# Patient Record
Sex: Female | Born: 1960
Health system: Southern US, Community
[De-identification: ages and names within clinical notes are randomized; demographics above are authoritative.]

## PROBLEM LIST (undated history)

## (undated) DIAGNOSIS — K635 Polyp of colon: Secondary | ICD-10-CM

## (undated) DIAGNOSIS — G8929 Other chronic pain: Secondary | ICD-10-CM

## (undated) DIAGNOSIS — K589 Irritable bowel syndrome without diarrhea: Secondary | ICD-10-CM

## (undated) DIAGNOSIS — K8020 Calculus of gallbladder without cholecystitis without obstruction: Secondary | ICD-10-CM

## (undated) DIAGNOSIS — F329 Major depressive disorder, single episode, unspecified: Secondary | ICD-10-CM

## (undated) DIAGNOSIS — T7840XA Allergy, unspecified, initial encounter: Secondary | ICD-10-CM

## (undated) DIAGNOSIS — R519 Headache, unspecified: Secondary | ICD-10-CM

## (undated) DIAGNOSIS — B1910 Unspecified viral hepatitis B without hepatic coma: Secondary | ICD-10-CM

## (undated) DIAGNOSIS — H8109 Meniere's disease, unspecified ear: Secondary | ICD-10-CM

## (undated) DIAGNOSIS — K5790 Diverticulosis of intestine, part unspecified, without perforation or abscess without bleeding: Secondary | ICD-10-CM

## (undated) DIAGNOSIS — G4730 Sleep apnea, unspecified: Secondary | ICD-10-CM

## (undated) DIAGNOSIS — T782XXA Anaphylactic shock, unspecified, initial encounter: Secondary | ICD-10-CM

## (undated) DIAGNOSIS — E119 Type 2 diabetes mellitus without complications: Secondary | ICD-10-CM

## (undated) DIAGNOSIS — F32A Depression, unspecified: Secondary | ICD-10-CM

## (undated) DIAGNOSIS — G709 Myoneural disorder, unspecified: Secondary | ICD-10-CM

## (undated) DIAGNOSIS — M797 Fibromyalgia: Secondary | ICD-10-CM

## (undated) DIAGNOSIS — R51 Headache: Secondary | ICD-10-CM

## (undated) HISTORY — DX: Anaphylactic shock, unspecified, initial encounter: T78.2XXA

## (undated) HISTORY — DX: Calculus of gallbladder without cholecystitis without obstruction: K80.20

## (undated) HISTORY — DX: Fibromyalgia: M79.7

## (undated) HISTORY — DX: Irritable bowel syndrome, unspecified: K58.9

## (undated) HISTORY — DX: Meniere's disease, unspecified ear: H81.09

## (undated) HISTORY — DX: Depression, unspecified: F32.A

## (undated) HISTORY — DX: Headache: R51

## (undated) HISTORY — PX: CHOLECYSTECTOMY: SHX55

## (undated) HISTORY — DX: Major depressive disorder, single episode, unspecified: F32.9

## (undated) HISTORY — DX: Myoneural disorder, unspecified: G70.9

## (undated) HISTORY — DX: Type 2 diabetes mellitus without complications: E11.9

## (undated) HISTORY — DX: Polyp of colon: K63.5

## (undated) HISTORY — PX: TRIGGER FINGER RELEASE: SHX641

## (undated) HISTORY — DX: Unspecified viral hepatitis B without hepatic coma: B19.10

## (undated) HISTORY — DX: Diverticulosis of intestine, part unspecified, without perforation or abscess without bleeding: K57.90

## (undated) HISTORY — DX: Headache, unspecified: R51.9

## (undated) HISTORY — DX: Other chronic pain: G89.29

## (undated) HISTORY — DX: Sleep apnea, unspecified: G47.30

## (undated) HISTORY — DX: Allergy, unspecified, initial encounter: T78.40XA

---

## 1981-01-25 HISTORY — PX: PILONIDAL CYST EXCISION: SHX744

## 1998-01-25 HISTORY — PX: ABDOMINAL HYSTERECTOMY: SHX81

## 1999-05-21 ENCOUNTER — Other Ambulatory Visit: Admission: RE | Admit: 1999-05-21 | Discharge: 1999-05-21 | Payer: Self-pay | Admitting: Obstetrics & Gynecology

## 1999-11-04 ENCOUNTER — Inpatient Hospital Stay (HOSPITAL_COMMUNITY): Admission: AD | Admit: 1999-11-04 | Discharge: 1999-11-06 | Payer: Self-pay | Admitting: *Deleted

## 1999-11-07 ENCOUNTER — Inpatient Hospital Stay (HOSPITAL_COMMUNITY): Admission: AD | Admit: 1999-11-07 | Discharge: 1999-11-07 | Payer: Self-pay | Admitting: Obstetrics and Gynecology

## 2000-06-29 ENCOUNTER — Encounter: Admission: RE | Admit: 2000-06-29 | Discharge: 2000-08-15 | Payer: Self-pay | Admitting: Internal Medicine

## 2001-11-28 ENCOUNTER — Encounter: Payer: Self-pay | Admitting: Otolaryngology

## 2001-11-28 ENCOUNTER — Ambulatory Visit (HOSPITAL_COMMUNITY): Admission: RE | Admit: 2001-11-28 | Discharge: 2001-11-28 | Payer: Self-pay | Admitting: Otolaryngology

## 2002-01-25 DIAGNOSIS — M797 Fibromyalgia: Secondary | ICD-10-CM

## 2002-01-25 HISTORY — PX: KNEE ARTHROSCOPY: SUR90

## 2002-01-25 HISTORY — DX: Fibromyalgia: M79.7

## 2005-01-25 DIAGNOSIS — T782XXA Anaphylactic shock, unspecified, initial encounter: Secondary | ICD-10-CM

## 2005-01-25 HISTORY — DX: Anaphylactic shock, unspecified, initial encounter: T78.2XXA

## 2009-10-28 LAB — HM COLONOSCOPY

## 2010-06-10 ENCOUNTER — Emergency Department: Payer: Self-pay | Admitting: Emergency Medicine

## 2010-06-26 DIAGNOSIS — H8103 Meniere's disease, bilateral: Secondary | ICD-10-CM | POA: Insufficient documentation

## 2010-10-16 ENCOUNTER — Ambulatory Visit (INDEPENDENT_AMBULATORY_CARE_PROVIDER_SITE_OTHER): Payer: BC Managed Care – PPO | Admitting: Internal Medicine

## 2010-10-16 ENCOUNTER — Encounter: Payer: Self-pay | Admitting: Internal Medicine

## 2010-10-16 ENCOUNTER — Telehealth: Payer: Self-pay | Admitting: Internal Medicine

## 2010-10-16 DIAGNOSIS — Z0000 Encounter for general adult medical examination without abnormal findings: Secondary | ICD-10-CM | POA: Insufficient documentation

## 2010-10-16 DIAGNOSIS — Z91018 Allergy to other foods: Secondary | ICD-10-CM | POA: Insufficient documentation

## 2010-10-16 DIAGNOSIS — R350 Frequency of micturition: Secondary | ICD-10-CM

## 2010-10-16 DIAGNOSIS — Z1239 Encounter for other screening for malignant neoplasm of breast: Secondary | ICD-10-CM

## 2010-10-16 DIAGNOSIS — R609 Edema, unspecified: Secondary | ICD-10-CM

## 2010-10-16 DIAGNOSIS — M797 Fibromyalgia: Secondary | ICD-10-CM | POA: Insufficient documentation

## 2010-10-16 DIAGNOSIS — Z86010 Personal history of colonic polyps: Secondary | ICD-10-CM

## 2010-10-16 LAB — POCT URINALYSIS DIPSTICK
Bilirubin, UA: NEGATIVE
Glucose, UA: NEGATIVE
Nitrite, UA: NEGATIVE

## 2010-10-16 MED ORDER — FUROSEMIDE 20 MG PO TABS: 20.0000 mg | ORAL_TABLET | Freq: Every day | ORAL | Status: DC

## 2010-10-16 NOTE — Patient Instructions (Signed)
I recommend stopping your natural remedies and starting each one back at two week intervals so we can determine if one of them is causing your fluid retention.   We are switching your diuretic to furosemide 20 mg daily .  Please get your annual physical scheduled at your convenience.

## 2010-10-16 NOTE — Progress Notes (Signed)
Subjective:    Patient ID: Barbara Wade, female    DOB: Jan 27, 1960, 50 y.o.   MRN: [id number]  HPI  New patient presents with LE edema after a 14 day vacation in [place]. Flew,  Came back 3 days PTA.  Swelling started on sept 5th . Started new supplements 4 weeks ago, including  DMEA CoQ10, Vit C, chromium piccolinate, inositol, L tyrosine,  And lecithin all self started.  Has  Been going to a gym in August doing circuit training as well as 2 miles 3 times weekly.  More short of breath lately with walk, but no orthopnea or chest pain .    Has been eating more whole grains,  Avoiding refined starches.  Does not use salt because of Meniere's..  Has OSA,  Uses CPAP 15 cm H20,  No test in years,  But sleeping well and using it faithfully and feeling well rested.   2nd cc is constipation for 4 years. Typical history is no bm for 5 days,  Then has a day of cramping and multiple bowel movements.  No change since exercising or starting new meds.  Last thyroid screen this summer normal.  3rd complaint is bladder incontinence with hesitancy  Couple of years, progressive.  Saw a urologist at [facility] 4 yrs at [facility] with testing. History of 2 vaginal deliveries,  No pelvic exam in over 4 yrs.  Hysterectomy,   Episodes of incontinence occur with sudden changes in position. No urge symptoms.    Past Medical History  Diagnosis Date  . Allergy   . Hypertension   . Sleep apnea   . Anaphylaxis 2007    occurred during allergy testing to environmental allergen  . Neuromuscular disorder   . Fibromyalgia 2004  . Depression     managed currently with [facility] Wort   No current outpatient prescriptions on file prior to visit.     Review of Systems  Constitutional: Negative for fever, chills and unexpected weight change.  HENT: Negative for hearing loss, ear pain, nosebleeds, congestion, sore throat, facial swelling, rhinorrhea, sneezing, mouth sores, trouble swallowing, neck pain, neck stiffness, voice change,  postnasal drip, sinus pressure, tinnitus and ear discharge.   Eyes: Negative for pain, discharge, redness and visual disturbance.  Respiratory: Negative for cough, chest tightness, shortness of breath, wheezing and stridor.   Cardiovascular: Positive for leg swelling. Negative for chest pain and palpitations.  Gastrointestinal: Positive for constipation.  Genitourinary: Positive for urgency and genital sores.  Musculoskeletal: Negative for myalgias and arthralgias.  Skin: Negative for color change and rash.  Neurological: Negative for dizziness, weakness, light-headedness and headaches.  Hematological: Negative for adenopathy.       Objective:   Physical Exam  Constitutional: She is oriented to person, place, and time. She appears well-developed and well-nourished.  HENT:  Mouth/Throat: Oropharynx is clear and moist.  Eyes: EOM are normal. Pupils are equal, round, and reactive to light. No scleral icterus.  Neck: Normal range of motion. Neck supple. No JVD present. No thyromegaly present.  Cardiovascular: Normal rate, regular rhythm, normal heart sounds and intact distal pulses.   Pulmonary/Chest: Effort normal and breath sounds normal.  Abdominal: Soft. Bowel sounds are normal. She exhibits no mass. There is no tenderness.  Musculoskeletal: Normal range of motion. She exhibits no edema.  Lymphadenopathy:    She has no cervical adenopathy.  Neurological: She is alert and oriented to person, place, and time.  Skin: Skin is warm and dry.  Psychiatric: She has  a normal mood and affect.          Assessment & Plan:

## 2010-10-17 LAB — COMPREHENSIVE METABOLIC PANEL
ALT: 75 U/L — ABNORMAL HIGH (ref 0–35)
AST: 51 U/L — ABNORMAL HIGH (ref 0–37)
Alkaline Phosphatase: 108 U/L (ref 39–117)
Calcium: 9.8 mg/dL (ref 8.4–10.5)
Chloride: 104 mEq/L (ref 96–112)
Creat: 0.69 mg/dL (ref 0.50–1.10)
Potassium: 3.9 mEq/L (ref 3.5–5.3)

## 2010-10-17 LAB — MICROALBUMIN / CREATININE URINE RATIO: Microalb Creat Ratio: 5.4 mg/g (ref 0.0–30.0)

## 2010-10-17 NOTE — Progress Notes (Signed)
Quick Note:  SHE HAS NO PROTEIN IN HER URINE BUT HER LIVER ENZYMES ARE A LITTLE ELEVATED. She needs to stop all of her supplements and we will repeat a hepatic panel in two weeks. v58.69 ______

## 2010-10-18 ENCOUNTER — Encounter: Payer: Self-pay | Admitting: Internal Medicine

## 2010-10-18 DIAGNOSIS — R609 Edema, unspecified: Secondary | ICD-10-CM | POA: Insufficient documentation

## 2010-10-18 NOTE — Assessment & Plan Note (Signed)
Etiology unclear.  She has treated OSA, so unlikely the cause,  Is exercising regularly and denies orthopena so not likley chf.  Conside r neprhotic syndrome vs VI vs drug effect, given all of the nutraceuticals she recently started.  Will rx lasxis,  Check Urine for protnein, have her stop all of the natural remedies she started  Taking and resume them one at a time at  Two week intervals.

## 2010-10-19 MED ORDER — LACTULOSE 20 GM/30ML PO SOLN: 15.0000 mL | Freq: Four times a day (QID) | ORAL | Status: DC | PRN

## 2010-10-19 NOTE — Telephone Encounter (Signed)
rx for lactulose sent to [facility].  Please tell patient that this is not for daily use, only for when bulk forming laxatives (metamucil. Miralax) fail.

## 2010-10-19 NOTE — Telephone Encounter (Signed)
Patient is asking that something for constipation be [facility] to [facility] on [address]

## 2010-10-20 NOTE — Telephone Encounter (Signed)
Left message advising patient

## 2010-11-06 ENCOUNTER — Other Ambulatory Visit (INDEPENDENT_AMBULATORY_CARE_PROVIDER_SITE_OTHER): Payer: BC Managed Care – PPO | Admitting: *Deleted

## 2010-11-06 DIAGNOSIS — Z79899 Other long term (current) drug therapy: Secondary | ICD-10-CM

## 2010-11-06 LAB — BASIC METABOLIC PANEL
BUN: 11 mg/dL (ref 6–23)
CO2: 30 mEq/L (ref 19–32)
Chloride: 105 mEq/L (ref 96–112)
Creatinine, Ser: 0.7 mg/dL (ref 0.4–1.2)
Potassium: 4.3 mEq/L (ref 3.5–5.1)

## 2010-11-10 ENCOUNTER — Telehealth: Payer: Self-pay | Admitting: Internal Medicine

## 2010-11-10 NOTE — Telephone Encounter (Signed)
Pt called to get lab results °

## 2010-11-11 NOTE — Telephone Encounter (Signed)
Patient notified of labs.   

## 2010-11-13 ENCOUNTER — Encounter: Payer: Self-pay | Admitting: Internal Medicine

## 2010-11-13 ENCOUNTER — Telehealth: Payer: Self-pay | Admitting: Internal Medicine

## 2010-11-13 DIAGNOSIS — N926 Irregular menstruation, unspecified: Secondary | ICD-10-CM

## 2010-11-13 NOTE — Telephone Encounter (Signed)
Yes

## 2010-11-13 NOTE — Telephone Encounter (Signed)
Pt has lab appointment 11/18/10 she wanted to know if you would add a full thyroid panel and a menapausal test to those labs

## 2010-11-16 NOTE — Telephone Encounter (Signed)
Pt aware we can add labs

## 2010-11-18 ENCOUNTER — Other Ambulatory Visit (INDEPENDENT_AMBULATORY_CARE_PROVIDER_SITE_OTHER): Payer: BC Managed Care – PPO | Admitting: *Deleted

## 2010-11-18 DIAGNOSIS — N926 Irregular menstruation, unspecified: Secondary | ICD-10-CM

## 2010-11-18 LAB — FOLLICLE STIMULATING HORMONE: FSH: 52.3 m[IU]/mL

## 2010-11-24 ENCOUNTER — Other Ambulatory Visit (INDEPENDENT_AMBULATORY_CARE_PROVIDER_SITE_OTHER): Payer: BC Managed Care – PPO | Admitting: *Deleted

## 2010-11-24 DIAGNOSIS — E785 Hyperlipidemia, unspecified: Secondary | ICD-10-CM

## 2010-11-24 DIAGNOSIS — Z131 Encounter for screening for diabetes mellitus: Secondary | ICD-10-CM

## 2010-11-24 LAB — HEMOGLOBIN A1C: Hgb A1c MFr Bld: 6.6 % — ABNORMAL HIGH (ref 4.6–6.5)

## 2010-11-24 LAB — LIPID PANEL
HDL: 45.5 mg/dL (ref >39.00)
Total CHOL/HDL Ratio: 5
Triglycerides: 238 mg/dL — ABNORMAL HIGH (ref 0.0–149.0)

## 2010-11-24 LAB — COMPREHENSIVE METABOLIC PANEL
AST: 37 U/L (ref 0–37)
Alkaline Phosphatase: 107 U/L (ref 39–117)
BUN: 15 mg/dL (ref 6–23)
Creatinine, Ser: 0.6 mg/dL (ref 0.4–1.2)
Potassium: 4.2 mEq/L (ref 3.5–5.1)

## 2010-11-24 LAB — LDL CHOLESTEROL, DIRECT: Direct LDL: 151.4 mg/dL

## 2010-11-26 ENCOUNTER — Telehealth: Payer: Self-pay | Admitting: Internal Medicine

## 2010-11-26 NOTE — Telephone Encounter (Signed)
Patient called for her results she was put on the schedule for November 5 to discuss labs.

## 2010-11-30 ENCOUNTER — Encounter: Payer: Self-pay | Admitting: Internal Medicine

## 2010-11-30 ENCOUNTER — Ambulatory Visit (INDEPENDENT_AMBULATORY_CARE_PROVIDER_SITE_OTHER): Payer: BC Managed Care – PPO | Admitting: Internal Medicine

## 2010-11-30 DIAGNOSIS — H8109 Meniere's disease, unspecified ear: Secondary | ICD-10-CM

## 2010-11-30 DIAGNOSIS — E119 Type 2 diabetes mellitus without complications: Secondary | ICD-10-CM

## 2010-11-30 DIAGNOSIS — B1910 Unspecified viral hepatitis B without hepatic coma: Secondary | ICD-10-CM

## 2010-11-30 DIAGNOSIS — E785 Hyperlipidemia, unspecified: Secondary | ICD-10-CM

## 2010-11-30 DIAGNOSIS — R7989 Other specified abnormal findings of blood chemistry: Secondary | ICD-10-CM

## 2010-11-30 DIAGNOSIS — R609 Edema, unspecified: Secondary | ICD-10-CM

## 2010-11-30 DIAGNOSIS — Z23 Encounter for immunization: Secondary | ICD-10-CM

## 2010-11-30 MED ORDER — TRIAMTERENE-HCTZ 37.5-25 MG PO TABS: 1.0000 | ORAL_TABLET | Freq: Every day | ORAL | Status: DC

## 2010-11-30 NOTE — Patient Instructions (Addendum)
Read about the low glycemic index diet,  The Mediterranedan diet, and Medifast  Online.  Your goal in exercise is eventually 5 days per wekk,  20 minutes of vigorous aerobic exercise.   Try the pita bread and flatbread by [employer]:  It is low carb. Limit rice, pasta and potaties to once each per week.  Avid watermelon, bananas and pineapple  Return  in 3 months , fasting labs before that

## 2010-11-30 NOTE — Progress Notes (Signed)
Subjective:    Patient ID: Barbara Wade, female    DOB: 10-20-1960, 50 y.o.   MRN: [id number]  HPI  Barbara Wade is a 50 yo white female who returns for followup on edema and on  abnormal labs which were found after her initial visit .  The workup for her LE edema thus far was negative.  There was no improvement with change in diuretic to lasix, and her Meniere's Disease has worsened since she stopped the maxzide. ON her initial labs she was noted to have elevated AST and ALT and I asked her to stop all all of her nutraceuticals.  She recalls that she did have a history of Hepatitis B in 1998. She has no histoyr of blood transfusion and does not known who she contracted it or whether she has chronic hepatitis C.  Thirdly since she stopped her [facility] Wort she has noticed a worsening of her mood disorder.   4th ,  Her labs indicated a new diagnosis of diabetes with a random glucose of 209 and a hgba1c of 6.6. Past Medical History  Diagnosis Date  . Allergy   . Hypertension   . Sleep apnea   . Anaphylaxis 2007    occurred during allergy testing to environmental allergen  . Neuromuscular disorder   . Fibromyalgia 2004  . Depression     managed currently with [facility]  . Hepatitis B virus infection    Current Outpatient Prescriptions on File Prior to Visit  Medication Sig Dispense Refill  . hyoscyamine (ANASPAZ) 0.125 MG TBDP Place 0.125 mg under the tongue as needed.        . Lactulose 20 GM/30ML SOLN Take 15 mLs (10 g total) by mouth every 6 (six) hours as needed (for relief of constipation).  240 mL  0  . meclizine (ANTIVERT) 25 MG tablet Take 25 mg by mouth 2 (two) times daily as needed.          Review of Systems  Constitutional: Positive for unexpected weight change. Negative for fever and chills.  HENT: Negative for hearing loss, ear pain, nosebleeds, congestion, sore throat, facial swelling, rhinorrhea, sneezing, mouth sores, trouble swallowing, neck pain, neck stiffness,  voice change, postnasal drip, sinus pressure, tinnitus and ear discharge.   Eyes: Negative for pain, discharge, redness and visual disturbance.  Respiratory: Negative for cough, chest tightness, shortness of breath, wheezing and stridor.   Cardiovascular: Positive for leg swelling. Negative for chest pain and palpitations.  Musculoskeletal: Negative for myalgias and arthralgias.  Skin: Negative for color change and rash.  Neurological: Negative for dizziness, weakness, light-headedness and headaches.  Hematological: Negative for adenopathy.  Psychiatric/Behavioral: Positive for sleep disturbance and dysphoric mood.       Objective:   Physical Exam  Constitutional: She is oriented to person, place, and time. She appears well-developed and well-nourished.  HENT:  Mouth/Throat: Oropharynx is clear and moist.  Eyes: EOM are normal. Pupils are equal, round, and reactive to light. No scleral icterus.  Neck: Normal range of motion. Neck supple. No JVD present. No thyromegaly present.  Cardiovascular: Normal rate, regular rhythm, normal heart sounds and intact distal pulses.   Pulmonary/Chest: Effort normal and breath sounds normal.  Abdominal: Soft. Bowel sounds are normal. She exhibits no mass. There is no tenderness.  Musculoskeletal: Normal range of motion. She exhibits edema.  Lymphadenopathy:    She has no cervical adenopathy.  Neurological: She is alert and oriented to person, place, and time.  Skin: Skin  is warm and dry.  Psychiatric: She has a normal mood and affect.          Assessment & Plan:

## 2010-12-02 ENCOUNTER — Encounter: Payer: Self-pay | Admitting: Internal Medicine

## 2010-12-02 DIAGNOSIS — B1910 Unspecified viral hepatitis B without hepatic coma: Secondary | ICD-10-CM | POA: Insufficient documentation

## 2010-12-02 DIAGNOSIS — E785 Hyperlipidemia, unspecified: Secondary | ICD-10-CM | POA: Insufficient documentation

## 2010-12-02 DIAGNOSIS — E118 Type 2 diabetes mellitus with unspecified complications: Secondary | ICD-10-CM | POA: Insufficient documentation

## 2010-12-02 NOTE — Assessment & Plan Note (Signed)
Referral to [facility] for venousl ultrasounds.

## 2010-12-02 NOTE — Assessment & Plan Note (Signed)
New diagnosis, with random glucose of 209 and hgba1c of 6.6  Spent 20 minutes reviewing fundamentals of diabetes management including diet and exercise.

## 2010-12-02 NOTE — Assessment & Plan Note (Signed)
She may have chronic hepatitis B given her persistent ALT elevation.  Will request records from prior physician to review workup.

## 2010-12-02 NOTE — Assessment & Plan Note (Signed)
With both elevated LDL and triglycerides in the setting of obesity and new onset diabetes,  Will repeat in 3 months after diet and exercise.

## 2010-12-08 ENCOUNTER — Encounter: Payer: BC Managed Care – PPO | Admitting: Internal Medicine

## 2010-12-28 ENCOUNTER — Encounter: Payer: Self-pay | Admitting: Internal Medicine

## 2010-12-28 ENCOUNTER — Ambulatory Visit (INDEPENDENT_AMBULATORY_CARE_PROVIDER_SITE_OTHER): Payer: BC Managed Care – PPO | Admitting: Internal Medicine

## 2010-12-28 VITALS — BP 112/68 | HR 73 | Temp 98.2°F | Resp 16 | Ht 63.5 in | Wt 220.0 lb

## 2010-12-28 DIAGNOSIS — Z01419 Encounter for gynecological examination (general) (routine) without abnormal findings: Secondary | ICD-10-CM

## 2010-12-28 DIAGNOSIS — K589 Irritable bowel syndrome without diarrhea: Secondary | ICD-10-CM

## 2010-12-28 DIAGNOSIS — Z86010 Personal history of colonic polyps: Secondary | ICD-10-CM

## 2010-12-28 MED ORDER — DICYCLOMINE HCL 20 MG PO TABS: 20.0000 mg | ORAL_TABLET | Freq: Three times a day (TID) | ORAL | Status: DC

## 2010-12-28 MED ORDER — SULFAMETHOXAZOLE-TRIMETHOPRIM 800-160 MG PO TABS: 1.0000 | ORAL_TABLET | Freq: Two times a day (BID) | ORAL | Status: AC

## 2010-12-28 NOTE — Patient Instructions (Addendum)
Irritable Bowel Syndrome Irritable Bowel Syndrome (IBS) is caused by a disturbance of normal bowel function. Other terms used are spastic colon, mucous colitis, and irritable colon. It does not require surgery, nor does it lead to cancer. There is no cure for IBS. But with proper diet, stress reduction, and medication, you will find that your problems (symptoms) will gradually disappear or improve. IBS is a common digestive disorder. It usually appears in late adolescence or early adulthood. Women develop it twice as often as men. CAUSES  After food has been digested and absorbed in the small intestine, waste material is moved into the colon (large intestine). In the colon, water and salts are absorbed from the undigested products coming from the small intestine. The remaining residue, or fecal material, is held for elimination. Under normal circumstances, gentle, rhythmic contractions on the bowel walls push the fecal material along the colon towards the rectum. In IBS, however, these contractions are irregular and poorly coordinated. The fecal material is either retained too long, resulting in constipation, or expelled too soon, producing diarrhea. SYMPTOMS  The most common symptom of IBS is pain. It is typically in the lower left side of the belly (abdomen). But it may occur anywhere in the abdomen. It can be felt as heartburn, backache, or even as a dull pain in the arms or shoulders. The pain comes from excessive bowel-muscle spasms and from the buildup of gas and fecal material in the colon. This pain:  Can range from sharp belly (abdominal) cramps to a dull, continuous ache.   Usually worsens soon after eating.   Is typically relieved by having a bowel movement or passing gas.  Abdominal pain is usually accompanied by constipation. But it may also produce diarrhea. The diarrhea typically occurs right after a meal or upon arising in the morning. The stools are typically soft and watery. They are  often flecked with secretions (mucus). Other symptoms of IBS include:  Bloating.   Loss of appetite.   Heartburn.   Feeling sick to your stomach (nausea).   Belching   Vomiting   Gas.  IBS may also cause a number of symptoms that are unrelated to the digestive system:  Fatigue.   Headaches.   Anxiety   Shortness of breath   Difficulty in concentrating.   Dizziness.  These symptoms tend to come and go. DIAGNOSIS  The symptoms of IBS closely mimic the symptoms of other, more serious digestive disorders. So your caregiver may wish to perform a variety of additional tests to exclude these disorders. He/she wants to be certain of learning what is wrong (diagnosis). The nature and purpose of each test will be explained to you. TREATMENT A number of medications are available to help correct bowel function and/or relieve bowel spasms and abdominal pain. Among the drugs available are:  Mild, non-irritating laxatives for severe constipation and to help restore normal bowel habits.   Specific anti-diarrheal medications to treat severe or prolonged diarrhea.   Anti-spasmodic agents to relieve intestinal cramps.   Your caregiver may also decide to treat you with a mild tranquilizer or sedative during unusually stressful periods in your life.  The important thing to remember is that if any drug is prescribed for you, make sure that you take it exactly as directed. Make sure that your caregiver knows how well it worked for you. HOME CARE INSTRUCTIONS   Avoid foods that are high in fat or oils. Some examples ZOX:WRUEA cream, butter, frankfurters, sausage, and other fatty  meats.   Avoid foods that have a laxative effect, such as fruit, fruit juice, and dairy products.   Cut out carbonated drinks, chewing gum, and "gassy" foods, such as beans and cabbage. This may help relieve bloating and belching.   Bran taken with plenty of liquids may help relieve constipation.   Keep track of  what foods seem to trigger your symptoms.   Avoid emotionally charged situations or circumstances that produce anxiety.   Start or continue exercising.   Get plenty of rest and sleep.  MAKE SURE YOU:   Understand these instructions.   Will watch your condition.   Will get help right away if you are not doing well or get worse.  Document Released: 01/11/2005 Document Revised: 09/23/2010 Document Reviewed: 09/01/2007 [facility] Patient Information 2012 [place], [place].   I have also printed you a prescription for an antibiotic called Septra DS which you can take for your next flare of HS

## 2010-12-28 NOTE — Progress Notes (Signed)
Subjective:    Patient ID: Barbara Wade, female    DOB: [birth year], 50 y.o.   MRN: [id number]  HPI  50 yo white female with history of obesity, new onset DM, hyperlipidemia returns for annual GYN exam. Routine Gyn Exam Patient here for routine exam. Current complaints: none. Personal health questionnaire reviewed: yes   Gynecologic History No LMP recorded. Patient has had a hysterectomy. Contraception: none Last Pap: 2009. Results were: normal Last mammogram: 2012. Results were: normal  Obstetric History OB History    Grav Para Term Preterm Abortions TAB SAB Ect Mult Living                       Review of Systems  Constitutional: Negative for fever, chills and unexpected weight change.  HENT: Negative for hearing loss, ear pain, nosebleeds, congestion, sore throat, facial swelling, rhinorrhea, sneezing, mouth sores, trouble swallowing, neck pain, neck stiffness, voice change, postnasal drip, sinus pressure, tinnitus and ear discharge.   Eyes: Negative for pain, discharge, redness and visual disturbance.  Respiratory: Negative for cough, chest tightness, shortness of breath, wheezing and stridor.   Cardiovascular: Negative for chest pain, palpitations and leg swelling.  Musculoskeletal: Negative for myalgias and arthralgias.  Skin: Negative for color change and rash.  Neurological: Negative for dizziness, weakness, light-headedness and headaches.  Hematological: Negative for adenopathy.       Objective:   Physical Exam  Constitutional: She is oriented to person, place, and time. She appears well-developed and well-nourished.  HENT:  Mouth/Throat: Oropharynx is clear and moist.  Eyes: EOM are normal. Pupils are equal, round, and reactive to light. No scleral icterus.  Neck: Normal range of motion. Neck supple. No JVD present. No thyromegaly present.  Cardiovascular: Normal rate, regular rhythm, normal heart sounds and intact distal pulses.   Pulmonary/Chest:  Effort normal and breath sounds normal.  Abdominal: Soft. Bowel sounds are normal. She exhibits no mass. There is no tenderness.  Genitourinary: Vagina normal and uterus normal. No vaginal discharge found.  Musculoskeletal: Normal range of motion. She exhibits no edema.  Lymphadenopathy:    She has no cervical adenopathy.  Neurological: She is alert and oriented to person, place, and time.  Skin: Skin is warm and dry.  Psychiatric: She has a normal mood and affect.          Assessment & Plan:   Screening for cervical CA:   Done today.  HS:  rx for Septra given for next flare.  Screening for breast CA:  Mammograms up to date,  Breast exam done today.   Updated Medication List Outpatient Encounter Prescriptions as of 12/28/2010  Medication Sig Dispense Refill  . hyoscyamine (ANASPAZ) 0.125 MG TBDP Place 0.125 mg under the tongue as needed.        . Lactulose 20 GM/30ML SOLN Take 15 mLs (10 g total) by mouth every 6 (six) hours as needed (for relief of constipation).  240 mL  0  . meclizine (ANTIVERT) 25 MG tablet Take 25 mg by mouth 2 (two) times daily as needed.        . triamterene-hydrochlorothiazide (MAXZIDE-25) 37.5-25 MG per tablet Take 1 each (1 tablet total) by mouth daily.  90 tablet  3  . dicyclomine (BENTYL) 20 MG tablet Take 1 tablet (20 mg total) by mouth 4 (four) times daily -  before meals and at bedtime.  120 tablet  1  . sulfamethoxazole-trimethoprim (SEPTRA DS) 800-160 MG per tablet Take 1 tablet by mouth  2 (two) times daily.  14 tablet  0  . DISCONTD: dicyclomine (BENTYL) 20 MG tablet Take 1 tablet (20 mg total) by mouth 4 (four) times daily -  before meals and at bedtime.  120 tablet  1

## 2010-12-30 ENCOUNTER — Encounter: Payer: Self-pay | Admitting: Internal Medicine

## 2010-12-30 DIAGNOSIS — E119 Type 2 diabetes mellitus without complications: Secondary | ICD-10-CM | POA: Insufficient documentation

## 2010-12-30 NOTE — Assessment & Plan Note (Signed)
Last scope  Sept 2011 at [facility], pon a 2 yr interval roe precancerous. Paternal GM died of colon CA paternal uncle died of metatstic Ca of un[place] origin.

## 2011-02-26 ENCOUNTER — Other Ambulatory Visit: Payer: BC Managed Care – PPO

## 2011-03-03 ENCOUNTER — Ambulatory Visit: Payer: BC Managed Care – PPO | Admitting: Internal Medicine

## 2011-03-03 DIAGNOSIS — Z0289 Encounter for other administrative examinations: Secondary | ICD-10-CM

## 2011-03-30 ENCOUNTER — Ambulatory Visit: Payer: BC Managed Care – PPO | Admitting: Internal Medicine

## 2011-04-06 ENCOUNTER — Other Ambulatory Visit: Payer: BC Managed Care – PPO

## 2011-04-07 ENCOUNTER — Other Ambulatory Visit (INDEPENDENT_AMBULATORY_CARE_PROVIDER_SITE_OTHER): Payer: BC Managed Care – PPO | Admitting: *Deleted

## 2011-04-07 DIAGNOSIS — E119 Type 2 diabetes mellitus without complications: Secondary | ICD-10-CM | POA: Diagnosis not present

## 2011-04-07 DIAGNOSIS — E785 Hyperlipidemia, unspecified: Secondary | ICD-10-CM | POA: Diagnosis not present

## 2011-04-07 DIAGNOSIS — R7989 Other specified abnormal findings of blood chemistry: Secondary | ICD-10-CM

## 2011-04-07 LAB — HEMOGLOBIN A1C: Hgb A1c MFr Bld: 5.9 % (ref 4.6–6.5)

## 2011-04-07 LAB — COMPREHENSIVE METABOLIC PANEL
ALT: 55 U/L — ABNORMAL HIGH (ref 0–35)
AST: 42 U/L — ABNORMAL HIGH (ref 0–37)
Albumin: 3.9 g/dL (ref 3.5–5.2)
Alkaline Phosphatase: 93 U/L (ref 39–117)
BUN: 18 mg/dL (ref 6–23)
Calcium: 10 mg/dL (ref 8.4–10.5)
Chloride: 104 mEq/L (ref 96–112)
Potassium: 4.8 mEq/L (ref 3.5–5.1)
Sodium: 142 mEq/L (ref 135–145)
Total Protein: 7.4 g/dL (ref 6.0–8.3)

## 2011-04-07 LAB — LIPID PANEL
Total CHOL/HDL Ratio: 4
Triglycerides: 217 mg/dL — ABNORMAL HIGH (ref 0.0–149.0)

## 2011-04-07 LAB — LDL CHOLESTEROL, DIRECT: Direct LDL: 123.6 mg/dL

## 2011-04-07 LAB — GAMMA GT: GGT: 36 U/L (ref 7–51)

## 2011-04-08 ENCOUNTER — Encounter: Payer: Self-pay | Admitting: Internal Medicine

## 2011-04-09 ENCOUNTER — Telehealth: Payer: Self-pay | Admitting: *Deleted

## 2011-04-09 NOTE — Telephone Encounter (Signed)
Morrie Sheldon left pt detailed VM

## 2011-04-09 NOTE — Telephone Encounter (Signed)
Opened in error

## 2011-05-07 ENCOUNTER — Encounter: Payer: Self-pay | Admitting: Internal Medicine

## 2011-05-11 ENCOUNTER — Telehealth: Payer: Self-pay | Admitting: Internal Medicine

## 2011-05-11 DIAGNOSIS — T782XXA Anaphylactic shock, unspecified, initial encounter: Secondary | ICD-10-CM

## 2011-05-11 MED ORDER — EPINEPHRINE 0.3 MG/0.3ML IJ DEVI: 0.3000 mg | Freq: Once | INTRAMUSCULAR | Status: AC

## 2011-05-11 NOTE — Telephone Encounter (Signed)
Done. You can refill an epi pen without my permission in the future

## 2011-05-11 NOTE — Telephone Encounter (Signed)
[phone] Pt needs refill on epi pen  Only one She  Going out of town Thursday and needs before then [facility]

## 2011-05-12 NOTE — Telephone Encounter (Signed)
Patient notified

## 2011-05-17 ENCOUNTER — Telehealth: Payer: Self-pay | Admitting: Internal Medicine

## 2011-05-20 NOTE — Telephone Encounter (Signed)
OPened in error.

## 2011-07-14 ENCOUNTER — Ambulatory Visit (INDEPENDENT_AMBULATORY_CARE_PROVIDER_SITE_OTHER): Payer: BC Managed Care – PPO | Admitting: Internal Medicine

## 2011-07-14 ENCOUNTER — Encounter: Payer: Self-pay | Admitting: Internal Medicine

## 2011-07-14 VITALS — BP 114/76 | HR 63 | Temp 97.8°F | Resp 14 | Wt 211.8 lb

## 2011-07-14 DIAGNOSIS — J309 Allergic rhinitis, unspecified: Secondary | ICD-10-CM | POA: Diagnosis not present

## 2011-07-14 DIAGNOSIS — I890 Lymphedema, not elsewhere classified: Secondary | ICD-10-CM | POA: Diagnosis not present

## 2011-07-14 MED ORDER — MONTELUKAST SODIUM 10 MG PO TABS: 10.0000 mg | ORAL_TABLET | Freq: Every day | ORAL | Status: DC

## 2011-07-14 MED ORDER — FLUTICASONE PROPIONATE 50 MCG/ACT NA SUSP: 2.0000 | Freq: Every day | NASAL | Status: DC

## 2011-07-14 MED ORDER — FEXOFENADINE HCL 180 MG PO TABS: 180.0000 mg | ORAL_TABLET | Freq: Every day | ORAL | Status: DC

## 2011-07-14 NOTE — Patient Instructions (Addendum)
I am trying the kitchen sink approach for your allergies  Increase  the allegra to 180 mg  ,   add flonase nasal spray.  Adding singulair one tablet daily  Once your symptoms are under control, we can try eliminating one of the 3 medications

## 2011-07-14 NOTE — Progress Notes (Signed)
Patient ID: Barbara Wade, female   DOB: 30-Jul-1960, 51 y.o.   MRN: [id number] Patient Active Problem List  Diagnosis  . History of colon polyps  . Screening for breast cancer  . Anaphylaxis  . Fibromyalgia  . Edema  . Hepatitis B virus infection  . Diabetes mellitus  . Hyperlipidemia LDL goal < 70  . Diabetes mellitus  . Lymphedema of leg  . Rhinitis, allergic    Subjective:  CC:   Chief Complaint  Patient presents with  . Allergic Rhinitis     HPI:   Barbara Wade a 51 y.o. female who presents Allergic rhinitis.  Symptoms have been present for 8 weeks.  She has had no response to otc allegra but not sure what dose she has tried.  Allegra used to work.,  Has environmental allergies by testing, and a history of anaphylaxis to allergy densitization shots.She has been feeling very groggy and tired, having a lot of swelling in the her hands and face.  Sneezing, itchy eyes, and itchy mouth since April .  She is sleeping well using her CPAP at 98% efficiency.    Past Medical History  Diagnosis Date  . Allergy   . Hypertension   . Sleep apnea   . Anaphylaxis 2007    occurred during allergy testing to environmental allergen  . Neuromuscular disorder   . Fibromyalgia 2004  . Depression     managed currently with [facility]  . Hepatitis B virus infection   . Diabetes mellitus     new onset,  hgb1c 6.2    Past Surgical History  Procedure Date  . Knee arthroscopy   . Trigger finger release   . Cholecystectomy     elective  . Abdominal hysterectomy 2000    for fibroids         The following portions of the patient's history were reviewed and updated as appropriate: Allergies, current medications, and problem list.    Review of Systems:  Comprehensive  review of systems was negative except those addressed in the HPI,     History   Social History  . Marital Status: Married    Spouse Name: Gaylyn Rong     Number of Children: 2  . Years of  Education: 16 yrs    Occupational History  . disabled     sec to meniere's 2009   Social History Main Topics  . Smoking status: Former Smoker    Quit date: 10/16/2002  . Smokeless tobacco: Never Used  . Alcohol Use: Yes     occasional  . Drug Use: No  . Sexually Active: Not on file   Other Topics Concern  . Not on file   Social History Narrative  . No narrative on file    Objective:  BP 114/76  Pulse 63  Temp 97.8 F (36.6 C) (Oral)  Resp 14  Wt 211 lb 12 oz (96.049 kg)  SpO2 97%  General appearance: alert, cooperative and appears stated age Ears: normal TM's and external ear canals both ears Throat: lips, mucosa, and tongue normal; teeth and gums normal Neck: no adenopathy, no carotid bruit, supple, symmetrical, trachea midline and thyroid not enlarged, symmetric, no tenderness/mass/nodules Lungs: clear to auscultation bilaterally Skin: Skin color, texture, turgor normal. No rashes or lesions Lymph nodes: Cervical, supraclavicular, and axillary nodes normal.  Assessment and Plan:  Lymphedema of leg Managed with daily pumping and compression pumps. .  Rhinitis, allergic Trial of maximal dose of allegra along  with steroid nasal spray and singulair to get symptoms under control,  Then will eliminate one medication once symptoms are under control.   Updated Medication List Outpatient Encounter Prescriptions as of 07/14/2011  Medication Sig Dispense Refill  . dicyclomine (BENTYL) 20 MG tablet Take 1 tablet (20 mg total) by mouth 4 (four) times daily -  before meals and at bedtime.  120 tablet  1  . EPINEPHrine (EPIPEN) 0.3 mg/0.3 mL DEVI Inject 0.3 mLs (0.3 mg total) into the muscle once.  1 Device  6  . hyoscyamine (ANASPAZ) 0.125 MG TBDP Place 0.125 mg under the tongue as needed.        . Lactulose 20 GM/30ML SOLN Take 15 mLs (10 g total) by mouth every 6 (six) hours as needed (for relief of constipation).  240 mL  0  . meclizine (ANTIVERT) 25 MG tablet Take 25 mg  by mouth 2 (two) times daily as needed.        . triamterene-hydrochlorothiazide (MAXZIDE-25) 37.5-25 MG per tablet Take 1 each (1 tablet total) by mouth daily.  90 tablet  3  . fexofenadine (ALLEGRA) 180 MG tablet Take 1 tablet (180 mg total) by mouth daily.  30 tablet  6  . fluticasone (FLONASE) 50 MCG/ACT nasal spray Place 2 sprays into the nose daily.  16 g  6  . montelukast (SINGULAIR) 10 MG tablet Take 1 tablet (10 mg total) by mouth at bedtime.  30 tablet  3     Orders Placed This Encounter  Procedures  . HM MAMMOGRAPHY  . HM COLONOSCOPY    No Follow-up on file.

## 2011-07-15 ENCOUNTER — Encounter: Payer: Self-pay | Admitting: Internal Medicine

## 2011-07-17 DIAGNOSIS — I890 Lymphedema, not elsewhere classified: Secondary | ICD-10-CM | POA: Insufficient documentation

## 2011-07-17 DIAGNOSIS — J309 Allergic rhinitis, unspecified: Secondary | ICD-10-CM | POA: Insufficient documentation

## 2011-07-17 NOTE — Assessment & Plan Note (Signed)
Managed with daily pumping and compression pumps. Marland Kitchen

## 2011-07-17 NOTE — Assessment & Plan Note (Addendum)
Trial of maximal dose of allegra along with steroid nasal spray and singulair to get symptoms under control,  Then will eliminate one medication once symptoms are under control.

## 2011-09-24 ENCOUNTER — Ambulatory Visit: Payer: BC Managed Care – PPO | Admitting: Internal Medicine

## 2011-10-07 ENCOUNTER — Other Ambulatory Visit: Payer: Self-pay | Admitting: *Deleted

## 2011-10-07 MED ORDER — MONTELUKAST SODIUM 10 MG PO TABS: 10.0000 mg | ORAL_TABLET | Freq: Every day | ORAL | Status: DC

## 2011-10-12 ENCOUNTER — Ambulatory Visit: Payer: BC Managed Care – PPO | Admitting: Internal Medicine

## 2011-10-29 ENCOUNTER — Ambulatory Visit (INDEPENDENT_AMBULATORY_CARE_PROVIDER_SITE_OTHER): Payer: BC Managed Care – PPO | Admitting: Internal Medicine

## 2011-10-29 ENCOUNTER — Encounter: Payer: Self-pay | Admitting: Internal Medicine

## 2011-10-29 VITALS — BP 110/64 | HR 75 | Temp 97.8°F | Ht 63.5 in | Wt 210.8 lb

## 2011-10-29 DIAGNOSIS — E785 Hyperlipidemia, unspecified: Secondary | ICD-10-CM

## 2011-10-29 DIAGNOSIS — Z860100 Personal history of colon polyps, unspecified: Secondary | ICD-10-CM

## 2011-10-29 DIAGNOSIS — E119 Type 2 diabetes mellitus without complications: Secondary | ICD-10-CM | POA: Diagnosis not present

## 2011-10-29 DIAGNOSIS — R350 Frequency of micturition: Secondary | ICD-10-CM | POA: Diagnosis not present

## 2011-10-29 DIAGNOSIS — Z124 Encounter for screening for malignant neoplasm of cervix: Secondary | ICD-10-CM

## 2011-10-29 LAB — COMPREHENSIVE METABOLIC PANEL
AST: 32 U/L (ref 0–37)
BUN: 16 mg/dL (ref 6–23)
CO2: 30 mEq/L (ref 19–32)
Calcium: 9.5 mg/dL (ref 8.4–10.5)
Chloride: 99 mEq/L (ref 96–112)
Creatinine, Ser: 0.7 mg/dL (ref 0.4–1.2)
GFR: 87.08 mL/min (ref >60.00)

## 2011-10-29 LAB — LIPID PANEL
HDL: 42.7 mg/dL (ref >39.00)
Total CHOL/HDL Ratio: 5
VLDL: 40.6 mg/dL — ABNORMAL HIGH (ref 0.0–40.0)

## 2011-10-29 LAB — POCT URINALYSIS DIPSTICK
Blood, UA: NEGATIVE
Nitrite, UA: NEGATIVE
Protein, UA: NEGATIVE
Urobilinogen, UA: 0.2
pH, UA: 6.5

## 2011-10-29 LAB — LDL CHOLESTEROL, DIRECT: Direct LDL: 132.1 mg/dL

## 2011-10-29 LAB — MICROALBUMIN / CREATININE URINE RATIO
Creatinine,U: 100.1 mg/dL
Microalb Creat Ratio: 0.2 mg/g (ref 0.0–30.0)

## 2011-10-29 NOTE — Progress Notes (Signed)
Patient ID: Janaysia Mcleroy, female   DOB: October 27, 1960, 51 y.o.   MRN: [id number]  Patient Active Problem List  Diagnosis  . History of colon polyps  . Screening for breast cancer  . Anaphylaxis  . Fibromyalgia  . Edema  . Hepatitis B virus infection  . Diabetes mellitus  . Hyperlipidemia LDL goal < 70  . Diabetes mellitus type 2, diet-controlled  . Lymphedema of leg  . Rhinitis, allergic  . Urinary frequency    Subjective:  CC:   Chief Complaint  Patient presents with  . Follow-up    HPI:   Sultana Tierney a 51 y.o. female who presents with Urinary frequency  accompanied by post void leakage of minute amounts of urine.  .  Not incontinence.  Symptoms present for over one year.   Bowels move every 3 to 4 days, but stool is soft and stringy.  History of adenomatous polyps by 2011 colonoscopy at [facility] and repeat Colonoscopy scheduled at [facility] end of month.  Wearing a fit bit to help her track her progress with diet and exercise as well as her sleep.  Armband downloads to software program and vibrates to let her know if she has reached her goals. She is frustrated with inability to lose weight..  Has been having nonrestorative sleep lately bc her CPAP has been malfunctioning.  No nausea, abdominal pain or jaundice.   Past Medical History  Diagnosis Date  . Allergy   . Hypertension   . Sleep apnea   . Anaphylaxis 2007    occurred during allergy testing to environmental allergen  . Neuromuscular disorder   . Fibromyalgia 2004  . Depression     managed currently with [facility]  . Hepatitis B virus infection   . Diabetes mellitus     new onset,  hgb1c 6.2    Past Surgical History  Procedure Date  . Knee arthroscopy   . Trigger finger release   . Cholecystectomy     elective  . Abdominal hysterectomy 2000    for fibroids     The following portions of the patient's history were reviewed and updated as appropriate: Allergies, current medications, and  problem list.    Review of Systems:   12 Pt  review of systems was negative except those addressed in the HPI.   History   Social History  . Marital Status: Married    Spouse Name: Gaylyn Rong     Number of Children: 2  . Years of Education: 16 yrs    Occupational History  . disabled     sec to meniere's 2009   Social History Main Topics  . Smoking status: Former Smoker    Quit date: 10/16/2002  . Smokeless tobacco: Never Used  . Alcohol Use: Yes     occasional  . Drug Use: No  . Sexually Active: Not on file   Other Topics Concern  . Not on file   Social History Narrative  . No narrative on file    Objective:  BP 110/64  Pulse 75  Temp 97.8 F (36.6 C) (Oral)  Ht 5' 3.5" (1.613 m)  Wt 210 lb 12 oz (95.596 kg)  BMI 36.75 kg/m2  SpO2 97%  General appearance: alert, cooperative and appears stated age Neck: no adenopathy, no carotid bruit, supple, symmetrical, trachea midline and thyroid not enlarged, symmetric, no tenderness/mass/nodules Back: symmetric, no curvature. ROM normal. No CVA tenderness. Lungs: clear to auscultation bilaterally Heart: regular rate and rhythm, S1,  S2 normal, no murmur, click, rub or gallop Abdomen: soft, non-tender; bowel sounds normal; no masses,  no organomegaly Pulses: 2+ and symmetric Skin: Skin color, texture, turgor normal. No rashes or lesions Lymph nodes: Cervical, supraclavicular, and axillary nodes normal.  Assessment and Plan:  Urinary frequency With incomplete voiding noted. UA suggested infection  Will treat empirically to see if symptoms resolve,  If not,  Post void residual discussed.   History of colon polyps She has a colonoscopy planned for the near future.   Diabetes mellitus type 2, diet-controlled Last  a1c was 5.9 in March.  Fasting glucoses have been < 125  .   Low GI diet discussed and handout given.   Hyperlipidemia LDL goal < 70 She has reduce LDL from 151 to 132 on diet alone and goal is 70 bc she has  siet controlled DM.  Given her desire to los weight, will give a low GI diet a chance and repeat lipids in 3 to 6 months with A1c.      Updated Medication List Outpatient Encounter Prescriptions as of 10/29/2011  Medication Sig Dispense Refill  . EPINEPHrine (EPIPEN) 0.3 mg/0.3 mL DEVI Inject 0.3 mLs (0.3 mg total) into the muscle once.  1 Device  6  . fexofenadine (ALLEGRA) 180 MG tablet Take 1 tablet (180 mg total) by mouth daily.  30 tablet  6  . hyoscyamine (ANASPAZ) 0.125 MG TBDP Place 0.125 mg under the tongue as needed.        . Lactulose 20 GM/30ML SOLN Take 15 mLs (10 g total) by mouth every 6 (six) hours as needed (for relief of constipation).  240 mL  0  . meclizine (ANTIVERT) 25 MG tablet Take 25 mg by mouth 2 (two) times daily as needed.        . montelukast (SINGULAIR) 10 MG tablet Take 1 tablet (10 mg total) by mouth at bedtime.  90 tablet  1  . triamterene-hydrochlorothiazide (MAXZIDE-25) 37.5-25 MG per tablet Take 1 each (1 tablet total) by mouth daily.  90 tablet  3  . dicyclomine (BENTYL) 20 MG tablet Take 1 tablet (20 mg total) by mouth 4 (four) times daily -  before meals and at bedtime.  120 tablet  1  . fluticasone (FLONASE) 50 MCG/ACT nasal spray Place 2 sprays into the nose daily.  16 g  6  . sulfamethoxazole-trimethoprim (BACTRIM DS) 800-160 MG per tablet Take 1 tablet by mouth 2 (two) times daily.  6 tablet  0     Orders Placed This Encounter  Procedures  . HM PAP SMEAR  . Comprehensive metabolic panel  . Microalbumin / creatinine urine ratio  . Lipid panel  . LDL cholesterol, direct  . POCT urinalysis dipstick    No Follow-up on file.

## 2011-10-29 NOTE — Patient Instructions (Addendum)
If your urinalysis shows no signs of infection or glucose, we will arrange an ultrasound of your bladder to determine if it is emptying appropriately  Resume your Kegel exercises daily with each void,  I will e mail you the results of your lab

## 2011-10-30 ENCOUNTER — Encounter: Payer: Self-pay | Admitting: Internal Medicine

## 2011-10-30 DIAGNOSIS — R350 Frequency of micturition: Secondary | ICD-10-CM | POA: Insufficient documentation

## 2011-10-30 MED ORDER — SULFAMETHOXAZOLE-TMP DS 800-160 MG PO TABS: 1.0000 | ORAL_TABLET | Freq: Two times a day (BID) | ORAL | Status: DC

## 2011-10-30 NOTE — Assessment & Plan Note (Signed)
With incomplete voiding noted. UA suggested infection  Will treat empirically to see if symptoms resolve,  If not,  Post void residual discussed.

## 2011-10-30 NOTE — Assessment & Plan Note (Signed)
She has reduce LDL from 151 to 132 on diet alone and goal is 70 bc she has siet controlled DM.  Given her desire to los weight, will give a low GI diet a chance and repeat lipids in 3 to 6 months with A1c.

## 2011-10-30 NOTE — Assessment & Plan Note (Addendum)
Last  a1c was 5.9 in March.  Fasting glucoses have been < 125  .   Low GI diet discussed and handout given.

## 2011-10-30 NOTE — Assessment & Plan Note (Signed)
She has a colonoscopy planned for the near future.

## 2011-11-01 DIAGNOSIS — G4730 Sleep apnea, unspecified: Secondary | ICD-10-CM | POA: Diagnosis not present

## 2011-11-02 ENCOUNTER — Telehealth: Payer: Self-pay | Admitting: Internal Medicine

## 2011-11-02 ENCOUNTER — Encounter (INDEPENDENT_AMBULATORY_CARE_PROVIDER_SITE_OTHER): Payer: BC Managed Care – PPO | Admitting: Internal Medicine

## 2011-11-02 DIAGNOSIS — R350 Frequency of micturition: Secondary | ICD-10-CM

## 2011-11-02 NOTE — Telephone Encounter (Signed)
Pt is calling back. I will relay the message to her about her appointment.

## 2011-11-02 NOTE — Telephone Encounter (Signed)
Left a message regarding her ultrasound appointment at [facility] location on 10.11.13 @ 1:00 patient is to arrive at 12:45 with a full bladder , drink 32 oz of water 1 hour prior to appointment and finish at least 30 min prior do not void.

## 2011-11-03 ENCOUNTER — Encounter: Payer: Self-pay | Admitting: Internal Medicine

## 2011-11-03 MED ORDER — SULFAMETHOXAZOLE-TMP DS 800-160 MG PO TABS: 1.0000 | ORAL_TABLET | Freq: Two times a day (BID) | ORAL | Status: DC

## 2011-11-05 ENCOUNTER — Ambulatory Visit: Payer: Self-pay | Admitting: Internal Medicine

## 2011-11-05 DIAGNOSIS — R350 Frequency of micturition: Secondary | ICD-10-CM | POA: Diagnosis not present

## 2011-11-08 ENCOUNTER — Telehealth: Payer: Self-pay | Admitting: Internal Medicine

## 2011-11-08 NOTE — Telephone Encounter (Signed)
A bladder ultrasound was normal. Did not see any masses, and it seemed to be emptying properly.

## 2011-11-08 NOTE — Telephone Encounter (Signed)
Patient is aware of what Dr. Darrick Huntsman documented I called and spoke with patient. She said that was good.

## 2011-11-11 ENCOUNTER — Encounter: Payer: Self-pay | Admitting: Internal Medicine

## 2011-11-11 DIAGNOSIS — Z1239 Encounter for other screening for malignant neoplasm of breast: Secondary | ICD-10-CM

## 2011-11-15 ENCOUNTER — Encounter: Payer: Self-pay | Admitting: Internal Medicine

## 2011-11-17 ENCOUNTER — Encounter: Payer: Self-pay | Admitting: Internal Medicine

## 2011-11-18 DIAGNOSIS — G4730 Sleep apnea, unspecified: Secondary | ICD-10-CM | POA: Diagnosis not present

## 2011-11-18 DIAGNOSIS — Z79899 Other long term (current) drug therapy: Secondary | ICD-10-CM | POA: Diagnosis not present

## 2011-11-18 DIAGNOSIS — K5730 Diverticulosis of large intestine without perforation or abscess without bleeding: Secondary | ICD-10-CM | POA: Diagnosis not present

## 2011-11-18 DIAGNOSIS — D126 Benign neoplasm of colon, unspecified: Secondary | ICD-10-CM | POA: Diagnosis not present

## 2011-11-18 DIAGNOSIS — IMO0001 Reserved for inherently not codable concepts without codable children: Secondary | ICD-10-CM | POA: Diagnosis not present

## 2011-11-18 DIAGNOSIS — Z86010 Personal history of colonic polyps: Secondary | ICD-10-CM | POA: Diagnosis not present

## 2011-11-18 DIAGNOSIS — Z800 Family history of malignant neoplasm of digestive organs: Secondary | ICD-10-CM | POA: Diagnosis not present

## 2011-11-18 DIAGNOSIS — I10 Essential (primary) hypertension: Secondary | ICD-10-CM | POA: Diagnosis not present

## 2011-11-21 ENCOUNTER — Encounter: Payer: Self-pay | Admitting: Internal Medicine

## 2011-12-02 ENCOUNTER — Encounter: Payer: Self-pay | Admitting: Internal Medicine

## 2011-12-03 ENCOUNTER — Telehealth: Payer: Self-pay | Admitting: Internal Medicine

## 2011-12-03 NOTE — Telephone Encounter (Signed)
Pt is having bad pains in her right hands.

## 2011-12-03 NOTE — Telephone Encounter (Signed)
Barbara Wade called patient and left a message on cell phone voicemail for patient to call back.  Nothing needs to be done until we hear from patient.

## 2011-12-03 NOTE — Telephone Encounter (Signed)
° °   Appointment Request From: Barbara Wade      With Provider: Duncan Dull, MD [-Primary Care Physician-]      Preferred Date Range: From 12/06/2011 To 12/09/2011      Preferred Times: Mon Afternoon, Tues Afternoon, Wed Afternoon, Thur Afternoon      Reason for visit: New Problem Visit      Comments:   Pain      Left message on pt cell phone.  I was wanting to get more information on appointment pt wanted for pain

## 2011-12-03 NOTE — Telephone Encounter (Signed)
What am I supposed to do with this message?  Has she been given an appt? thanks

## 2011-12-07 ENCOUNTER — Ambulatory Visit: Payer: BC Managed Care – PPO | Admitting: Internal Medicine

## 2011-12-09 ENCOUNTER — Ambulatory Visit: Payer: BC Managed Care – PPO | Admitting: Internal Medicine

## 2011-12-13 ENCOUNTER — Encounter: Payer: Self-pay | Admitting: Internal Medicine

## 2011-12-13 ENCOUNTER — Ambulatory Visit (INDEPENDENT_AMBULATORY_CARE_PROVIDER_SITE_OTHER): Payer: BC Managed Care – PPO | Admitting: Internal Medicine

## 2011-12-13 VITALS — BP 118/70 | HR 75 | Temp 98.4°F | Resp 12 | Ht 63.0 in | Wt 215.5 lb

## 2011-12-13 DIAGNOSIS — M129 Arthropathy, unspecified: Secondary | ICD-10-CM | POA: Diagnosis not present

## 2011-12-13 DIAGNOSIS — M79609 Pain in unspecified limb: Secondary | ICD-10-CM | POA: Diagnosis not present

## 2011-12-13 DIAGNOSIS — M79641 Pain in right hand: Secondary | ICD-10-CM

## 2011-12-13 DIAGNOSIS — E119 Type 2 diabetes mellitus without complications: Secondary | ICD-10-CM

## 2011-12-13 DIAGNOSIS — M130 Polyarthritis, unspecified: Secondary | ICD-10-CM

## 2011-12-13 DIAGNOSIS — M79672 Pain in left foot: Secondary | ICD-10-CM

## 2011-12-13 NOTE — Progress Notes (Signed)
Patient ID: Barbara Wade, female   DOB: 1960/09/18, 51 y.o.   MRN: [id number] Patient Active Problem List  Diagnosis  . History of colon polyps  . Screening for breast cancer  . Anaphylaxis  . Fibromyalgia  . Edema  . Hepatitis B virus infection  . Diabetes mellitus  . Hyperlipidemia LDL goal < 70  . Diabetes mellitus type 2, diet-controlled  . Lymphedema of leg  . Rhinitis, allergic  . Urinary frequency  . Hand pain, right  . Pain of left heel    Subjective:  CC:   Chief Complaint  Patient presents with  . Wrist Pain  . Foot Pain    HPI:   Barbara Wade a 51 y.o. female who presents with right hand swelling on lateral side with pain to palpation and with use of 4th and 5th fingers.  pain started after receiving an  IV in hand for her colonoscopy in mid October at [facility]  .  Cannot move 4th and 5th ffingenrs due to aggravation of pain .  Has been using ibuprofen 400 mg tid with no improvement.    2) Left heel pain  For several weeks.  Pain is posterior to heel , over the achilles tendon, aggravated by forced plantar flexion.     Past Medical History  Diagnosis Date  . Allergy   . Hypertension   . Sleep apnea   . Anaphylaxis 2007    occurred during allergy testing to environmental allergen  . Neuromuscular disorder   . Fibromyalgia 2004  . Depression     managed currently with [facility]  . Hepatitis B virus infection   . Diabetes mellitus     new onset,  hgb1c 6.2    Past Surgical History  Procedure Date  . Knee arthroscopy   . Trigger finger release   . Cholecystectomy     elective  . Abdominal hysterectomy 2000    for fibroids         The following portions of the patient's history were reviewed and updated as appropriate: Allergies, current medications, and problem list.    Review of Systems:   12 Pt  review of systems was negative except those addressed in the HPI,     History   Social History  . Marital  Status: Married    Spouse Name: Gaylyn Rong     Number of Children: 2  . Years of Education: 16 yrs    Occupational History  . disabled     sec to meniere's 2009   Social History Main Topics  . Smoking status: Former Smoker    Quit date: 10/16/2002  . Smokeless tobacco: Never Used  . Alcohol Use: Yes     Comment: occasional  . Drug Use: No  . Sexually Active: Not on file   Other Topics Concern  . Not on file   Social History Narrative  . No narrative on file    Objective:  BP 118/70  Pulse 75  Temp 98.4 F (36.9 C) (Oral)  Resp 12  Ht 5\' 3"  (1.6 m)  Wt 215 lb 8 oz (97.75 kg)  BMI 38.17 kg/m2  SpO2 96%  General appearance: alert, cooperative and appears stated age Ears: normal TM's and external ear canals both ears Throat: lips, mucosa, and tongue normal; teeth and gums normal Neck: no adenopathy, no carotid bruit, supple, symmetrical, trachea midline and thyroid not enlarged, symmetric, no tenderness/mass/nodules Back: symmetric, no curvature. ROM normal. No CVA  tenderness. Lungs: clear to auscultation bilaterally Heart: regular rate and rhythm, S1, S2 normal, no murmur, click, rub or gallop Abdomen: soft, non-tender; bowel sounds normal; no masses,  no organomegaly Pulses: 2+ and symmetric Skin: Skin color, texture, turgor normal. No rashes or lesions Lymph nodes: Cervical, supraclavicular, and axillary nodes normal. MSK.  Right hand/wrist L lateral edema noted without warmth or redness,  Pain elicited with forced extension of 4th and 5th fingers.  Left ankle: pain elicited wirh dorsiflexion.,  No achilles bulge,  Normal ROM.    Assessment and Plan:  Hand pain, right Etiology unclear  No signs of cellulitis .  Symptoms started after IV placement in hand.  ESR is mildly elevated but CRP is normal. Plain films ordered to rule our erosions or periosteitis  Pain of left heel ROM is normal but plantar flexion causes pain.  Recent shoe change to [employer] may be  aggravating it.  Stretching exercises, ice.    Updated Medication List Outpatient Encounter Prescriptions as of 12/13/2011  Medication Sig Dispense Refill  . dicyclomine (BENTYL) 20 MG tablet Take 1 tablet (20 mg total) by mouth 4 (four) times daily -  before meals and at bedtime.  120 tablet  1  . EPINEPHrine (EPIPEN) 0.3 mg/0.3 mL DEVI Inject 0.3 mLs (0.3 mg total) into the muscle once.  1 Device  6  . fexofenadine (ALLEGRA) 180 MG tablet Take 1 tablet (180 mg total) by mouth daily.  30 tablet  6  . fluticasone (FLONASE) 50 MCG/ACT nasal spray Place 2 sprays into the nose daily.  16 g  6  . hyoscyamine (ANASPAZ) 0.125 MG TBDP Place 0.125 mg under the tongue as needed.        . Lactulose 20 GM/30ML SOLN Take 15 mLs (10 g total) by mouth every 6 (six) hours as needed (for relief of constipation).  240 mL  0  . meclizine (ANTIVERT) 25 MG tablet Take 25 mg by mouth 2 (two) times daily as needed.        . montelukast (SINGULAIR) 10 MG tablet Take 1 tablet (10 mg total) by mouth at bedtime.  90 tablet  1  . sulfamethoxazole-trimethoprim (BACTRIM DS) 800-160 MG per tablet Take 1 tablet by mouth 2 (two) times daily.  6 tablet  0  . triamterene-hydrochlorothiazide (MAXZIDE-25) 37.5-25 MG per tablet Take 1 each (1 tablet total) by mouth daily.  90 tablet  3     Orders Placed This Encounter  Procedures  . DG Hand Complete Right  . Sedimentation rate  . C-reactive protein  . ANA  . Hemoglobin A1c    No Follow-up on file.

## 2011-12-13 NOTE — Patient Instructions (Addendum)
I am checking your x rays for signs of inflammation and fracture  If they are normal I will send in a prescription for a 6 day taper of prednisone to help the swelling  Do not take ibuprofen  Or alleve  For 10 days if we use the prednsione

## 2011-12-14 LAB — SEDIMENTATION RATE: Sed Rate: 34 mm/hr — ABNORMAL HIGH (ref 0–22)

## 2011-12-15 ENCOUNTER — Encounter: Payer: Self-pay | Admitting: Internal Medicine

## 2011-12-15 ENCOUNTER — Ambulatory Visit (INDEPENDENT_AMBULATORY_CARE_PROVIDER_SITE_OTHER)
Admission: RE | Admit: 2011-12-15 | Discharge: 2011-12-15 | Disposition: A | Discharge status: Home / Self Care | Payer: BC Managed Care – PPO | Source: Ambulatory Visit | Attending: Internal Medicine | Admitting: Internal Medicine

## 2011-12-15 DIAGNOSIS — M79641 Pain in right hand: Secondary | ICD-10-CM

## 2011-12-15 DIAGNOSIS — M79609 Pain in unspecified limb: Secondary | ICD-10-CM

## 2011-12-15 DIAGNOSIS — M79672 Pain in left foot: Secondary | ICD-10-CM | POA: Insufficient documentation

## 2011-12-15 NOTE — Assessment & Plan Note (Signed)
ROM is normal but plantar flexion causes pain.  Recent shoe change to [employer] may be aggravating it.  Stretching exercises, ice.

## 2011-12-15 NOTE — Assessment & Plan Note (Signed)
Etiology unclear  No signs of cellulitis .  Symptoms started after IV placement in hand.  ESR is mildly elevated but CRP is normal. Plain films ordered to rule our erosions or periosteitis

## 2011-12-28 ENCOUNTER — Ambulatory Visit (INDEPENDENT_AMBULATORY_CARE_PROVIDER_SITE_OTHER): Payer: BC Managed Care – PPO | Admitting: Internal Medicine

## 2011-12-28 ENCOUNTER — Encounter: Payer: Self-pay | Admitting: Internal Medicine

## 2011-12-28 VITALS — BP 118/64 | HR 63 | Temp 98.2°F | Resp 12 | Ht 63.0 in | Wt 214.5 lb

## 2011-12-28 DIAGNOSIS — M797 Fibromyalgia: Secondary | ICD-10-CM

## 2011-12-28 DIAGNOSIS — M79609 Pain in unspecified limb: Secondary | ICD-10-CM

## 2011-12-28 DIAGNOSIS — IMO0001 Reserved for inherently not codable concepts without codable children: Secondary | ICD-10-CM

## 2011-12-28 DIAGNOSIS — M79641 Pain in right hand: Secondary | ICD-10-CM

## 2011-12-28 MED ORDER — MILNACIPRAN HCL 50 MG PO TABS: 50.0000 mg | ORAL_TABLET | Freq: Two times a day (BID) | ORAL | Status: DC

## 2011-12-28 NOTE — Progress Notes (Signed)
Patient ID: Barbara Wade, female   DOB: August 07, 1960, 51 y.o.   MRN: [id number]   Patient Active Problem List  Diagnosis  . History of colon polyps  . Screening for breast cancer  . Anaphylaxis  . Fibromyalgia  . Edema  . Hepatitis B virus infection  . Diabetes mellitus  . Hyperlipidemia LDL goal < 70  . Diabetes mellitus type 2, diet-controlled  . Lymphedema of leg  . Rhinitis, allergic  . Urinary frequency  . Hand pain, right  . Pain of left heel    Subjective:  CC:   Chief Complaint  Patient presents with  . Follow-up    fibromyalgia    HPI:   Barbara Wade Barbara a 51 y.o. female who presents Persistent pain in all muscle groups.   History of fibromyalgia pain syndrome.  Prior trial of fluoxetine , tried it for quite a while and it did not help.  The muscles most bothered are those i n the arms , neck , upper and lower back,  Legs not as much.  Walks for exercise.  Pain does not keep her up at night.  No morning stiffness.  Some joint pain but not chronically.     Past Medical History  Diagnosis Date  . Allergy   . Hypertension   . Sleep apnea   . Anaphylaxis 2007    occurred during allergy testing to environmental allergen  . Neuromuscular disorder   . Fibromyalgia 2004  . Depression     managed currently with [facility]  . Hepatitis B virus infection   . Diabetes mellitus     new onset,  hgb1c 6.2    Past Surgical History  Procedure Date  . Knee arthroscopy   . Trigger finger release   . Cholecystectomy     elective  . Abdominal hysterectomy 2000    for fibroids         The following portions of the patient's history were reviewed and updated as appropriate: Allergies, current medications, and problem list.    Review of Systems:   12 Pt  review of systems was negative except those addressed in the HPI,     History   Social History  . Marital Status: Married    Spouse Name: Gaylyn Rong     Number of Children: 2  .  Years of Education: 16 yrs    Occupational History  . disabled     sec to meniere's 2009   Social History Main Topics  . Smoking status: Former Smoker    Quit date: 10/16/2002  . Smokeless tobacco: Never Used  . Alcohol Use: Yes     Comment: occasional  . Drug Use: No  . Sexually Active: Not on file   Other Topics Concern  . Not on file   Social History Narrative  . No narrative on file    Objective:  BP 118/64  Pulse 63  Temp 98.2 F (36.8 C) (Oral)  Resp 12  Ht 5\' 3"  (1.6 m)  Wt 214 lb 8 oz (97.297 kg)  BMI 38.00 kg/m2  SpO2 96%  General appearance: alert, cooperative and appears stated age Ears: normal TM's and external ear canals both ears Throat: lips, mucosa, and tongue normal; teeth and gums normal Neck: no adenopathy, no carotid bruit, supple, symmetrical, trachea midline and thyroid not enlarged, symmetric, no tenderness/mass/nodules Back: symmetric, no curvature. ROM normal. No CVA tenderness. Lungs: clear to auscultation bilaterally Heart: regular rate and rhythm, S1,  S2 normal, no murmur, click, rub or gallop Abdomen: soft, non-tender; bowel sounds normal; no masses,  no organomegaly Pulses: 2+ and symmetric Skin: Skin color, texture, turgor normal. No rashes or lesions Lymph nodes: Cervical, supraclavicular, and axillary nodes normal. MSK:  Point tenderness noted in deltoids, biceps, paraspinous muscles  Trapezius muscles and thighs. no synovitis of hands, wrist, knees or elbows.    Assessment and Plan:  Fibromyalgia 25 minutes spend in face to fact time with patient in evaluation and discusssion of treatment alternatives.  Trial of SSRI treatment initiated with savella. 2 week titration sample packet given.  Discussed complementary nontraditional treatments including  PT,  acupuncture and tai chi.  Return in 1 month  Hand pain, right Resolving,  Secondary to IV infiltration several weeks ago .  No cellulitis by exam.  Plain film was negative for soft  tissue swelling.    Updated Medication List Outpatient Encounter Prescriptions as of 12/28/2011  Medication Sig Dispense Refill  . dicyclomine (BENTYL) 20 MG tablet Take 1 tablet (20 mg total) by mouth 4 (four) times daily -  before meals and at bedtime.  120 tablet  1  . EPINEPHrine (EPIPEN) 0.3 mg/0.3 mL DEVI Inject 0.3 mLs (0.3 mg total) into the muscle once.  1 Device  6  . fexofenadine (ALLEGRA) 180 MG tablet Take 1 tablet (180 mg total) by mouth daily.  30 tablet  6  . hyoscyamine (ANASPAZ) 0.125 MG TBDP Place 0.125 mg under the tongue as needed.        . Lactulose 20 GM/30ML SOLN Take 15 mLs (10 g total) by mouth every 6 (six) hours as needed (for relief of constipation).  240 mL  0  . meclizine (ANTIVERT) 25 MG tablet Take 25 mg by mouth 2 (two) times daily as needed.        . montelukast (SINGULAIR) 10 MG tablet Take 1 tablet (10 mg total) by mouth at bedtime.  90 tablet  1  . sulfamethoxazole-trimethoprim (BACTRIM DS) 800-160 MG per tablet Take 1 tablet by mouth 2 (two) times daily.  6 tablet  0  . triamterene-hydrochlorothiazide (MAXZIDE-25) 37.5-25 MG per tablet Take 1 each (1 tablet total) by mouth daily.  90 tablet  3  . fluticasone (FLONASE) 50 MCG/ACT nasal spray Place 2 sprays into the nose daily.  16 g  6  . Milnacipran (SAVELLA) 50 MG TABS Take 1 tablet (50 mg total) by mouth 2 (two) times daily.  60 tablet  2     No orders of the defined types were placed in this encounter.    No Follow-up on file.

## 2011-12-28 NOTE — Patient Instructions (Addendum)
There are many treatment options for fibromyalgia.   i am recommending that we try savella.  I also recommend that you consider adding  complementary treatments such as physical therapy,  Or nontraditional alternatives such as  tai chi and acupuncture.   Return in 4 weeks

## 2011-12-28 NOTE — Assessment & Plan Note (Addendum)
Tirial of SSRI treatment initiated with savella. 2 week titration sample packet given.  Discussed complementary nontraditional treatments including  PT,  acupuncture and tai chi.  Return in 1 month

## 2011-12-30 ENCOUNTER — Encounter: Payer: Self-pay | Admitting: Internal Medicine

## 2011-12-30 NOTE — Assessment & Plan Note (Signed)
Resolving,  Secondary to IV infiltration several weeks ago .  No cellulitis by exam.  Plain film was negative for soft tissue swelling.

## 2012-01-03 ENCOUNTER — Encounter: Payer: Self-pay | Admitting: Internal Medicine

## 2012-01-03 DIAGNOSIS — E559 Vitamin D deficiency, unspecified: Secondary | ICD-10-CM

## 2012-01-04 ENCOUNTER — Encounter: Payer: Self-pay | Admitting: Internal Medicine

## 2012-01-05 ENCOUNTER — Encounter: Payer: Self-pay | Admitting: Internal Medicine

## 2012-01-05 DIAGNOSIS — R7989 Other specified abnormal findings of blood chemistry: Secondary | ICD-10-CM

## 2012-01-05 DIAGNOSIS — E559 Vitamin D deficiency, unspecified: Secondary | ICD-10-CM | POA: Insufficient documentation

## 2012-01-05 MED ORDER — ERGOCALCIFEROL 1.25 MG (50000 UT) PO CAPS: 50000.0000 [IU] | ORAL_CAPSULE | ORAL | Status: DC

## 2012-01-10 ENCOUNTER — Telehealth: Payer: Self-pay | Admitting: Internal Medicine

## 2012-01-10 MED ORDER — ERGOCALCIFEROL 1.25 MG (50000 UT) PO CAPS: 50000.0000 [IU] | ORAL_CAPSULE | ORAL | Status: DC

## 2012-01-10 NOTE — Telephone Encounter (Signed)
Left message on patient vm with detailed instructions and lab results.

## 2012-01-10 NOTE — Telephone Encounter (Signed)
Labs reviewed,  Her vitamin d level is very low and needs more aggressive rx than 2000 units daily for 3 months.  Pshe should take a megadose weekly for one month, then continue 2000 units daily  The elevated. ferritin does not need a workup at this time based on her other labs

## 2012-01-12 ENCOUNTER — Encounter: Payer: Self-pay | Admitting: Internal Medicine

## 2012-01-15 ENCOUNTER — Other Ambulatory Visit: Payer: Self-pay | Admitting: Internal Medicine

## 2012-01-31 ENCOUNTER — Encounter: Payer: Self-pay | Admitting: Internal Medicine

## 2012-01-31 ENCOUNTER — Ambulatory Visit (INDEPENDENT_AMBULATORY_CARE_PROVIDER_SITE_OTHER): Payer: BC Managed Care – PPO | Admitting: Internal Medicine

## 2012-01-31 VITALS — BP 114/70 | HR 90 | Temp 98.0°F | Resp 16 | Wt 213.8 lb

## 2012-01-31 DIAGNOSIS — E669 Obesity, unspecified: Secondary | ICD-10-CM | POA: Diagnosis not present

## 2012-01-31 DIAGNOSIS — IMO0001 Reserved for inherently not codable concepts without codable children: Secondary | ICD-10-CM | POA: Diagnosis not present

## 2012-01-31 DIAGNOSIS — M797 Fibromyalgia: Secondary | ICD-10-CM

## 2012-01-31 MED ORDER — PHENTERMINE HCL 37.5 MG PO TABS: 18.0000 mg | ORAL_TABLET | Freq: Two times a day (BID) | ORAL | Status: DC

## 2012-01-31 MED ORDER — ERGOCALCIFEROL 1.25 MG (50000 UT) PO CAPS: 50000.0000 [IU] | ORAL_CAPSULE | ORAL | Status: DC

## 2012-01-31 MED ORDER — PHENTERMINE HCL 30 MG PO TBDP: 15.0000 mg | ORAL_TABLET | Freq: Two times a day (BID) | ORAL | Status: DC

## 2012-01-31 NOTE — Progress Notes (Signed)
Patient ID: Barbara Wade, female   DOB: April 24, 1960, 52 y.o.   MRN: [id number]  Patient Active Problem List  Diagnosis  . History of colon polyps  . Screening for breast cancer  . Anaphylaxis  . Fibromyalgia  . Edema  . Hepatitis B virus infection  . Hyperlipidemia LDL goal < 70  . Diabetes mellitus type 2, diet-controlled  . Lymphedema of leg  . Rhinitis, allergic  . Urinary frequency  . Hand pain, right  . Pain of left heel  . Vitamin D deficiency  . Obesity (BMI 30-39.9)    Subjective:  CC:   Chief Complaint  Patient presents with  . Follow-up    HPI:   Barbara Wade a 52 y.o. female who presents Followup on initiation of Savella for treatment of fibromyalgia. She states that since she had changed the second dose to earlier in the day she is now sleeping well and having a marked improvement in her fibromyalgia pain. 2) obesity. She was given a low carbohydrate diet with instructions and has been following a diet daily and walking daily but has lost only 2 pounds. She is requesting a trial of phentermine. She understands this medication it is a stimulant and can cause side effects including hypertension and increased pulse rate as well as addiction.   Past Medical History  Diagnosis Date  . Allergy   . Hypertension   . Sleep apnea   . Anaphylaxis 2007    occurred during allergy testing to environmental allergen  . Neuromuscular disorder   . Fibromyalgia 2004  . Depression     managed currently with [facility]  . Hepatitis B virus infection   . Diabetes mellitus     new onset,  hgb1c 6.2    Past Surgical History  Procedure Date  . Knee arthroscopy   . Trigger finger release   . Cholecystectomy     elective  . Abdominal hysterectomy 2000    for fibroids         The following portions of the patient's history were reviewed and updated as appropriate: Allergies, current medications, and problem list.    Review of  Systems:   12 Pt  review of systems was negative except those addressed in the HPI,     History   Social History  . Marital Status: Married    Spouse Name: Gaylyn Rong     Number of Children: 2  . Years of Education: 16 yrs    Occupational History  . disabled     sec to meniere's 2009   Social History Main Topics  . Smoking status: Former Smoker    Quit date: 10/16/2002  . Smokeless tobacco: Never Used  . Alcohol Use: Yes     Comment: occasional  . Drug Use: No  . Sexually Active: Not on file   Other Topics Concern  . Not on file   Social History Narrative  . No narrative on file    Objective:  BP 114/70  Pulse 90  Temp 98 F (36.7 C) (Oral)  Resp 16  Wt 213 lb 12 oz (96.956 kg)  SpO2 97%  General appearance: alert, cooperative and appears stated age Ears: normal TM's and external ear canals both ears Throat: lips, mucosa, and tongue normal; teeth and gums normal Neck: no adenopathy, no carotid bruit, supple, symmetrical, trachea midline and thyroid not enlarged, symmetric, no tenderness/mass/nodules Back: symmetric, no curvature. ROM normal. No CVA tenderness. Lungs: clear to auscultation  bilaterally Heart: regular rate and rhythm, S1, S2 normal, no murmur, click, rub or gallop Abdomen: soft, non-tender; bowel sounds normal; no masses,  no organomegaly Pulses: 2+ and symmetric Skin: Skin color, texture, turgor normal. No rashes or lesions Lymph nodes: Cervical, supraclavicular, and axillary nodes normal.  Assessment and Plan:  Fibromyalgia Pain syndrome has improved with Savella. She is exercising regularly.  Obesity (BMI 30-39.9) She has been following a low glycemic index diet but only about 4 times daily keep. She is exercising regularly with walking. Trial of phentermine for 3 months. Risks and benefits of temporary use of this  medication discussed.   Updated Medication List Outpatient Encounter Prescriptions as of 01/31/2012  Medication Sig Dispense  Refill  . EPINEPHrine (EPIPEN) 0.3 mg/0.3 mL DEVI Inject 0.3 mLs (0.3 mg total) into the muscle once.  1 Device  6  . ergocalciferol (DRISDOL) 50000 UNITS capsule Take 1 capsule (50,000 Units total) by mouth once a week.  4 capsule  2  . ergocalciferol (DRISDOL) 50000 UNITS capsule Take 1 capsule (50,000 Units total) by mouth once a week.  4 capsule  0  . fexofenadine (ALLEGRA) 180 MG tablet Take 1 tablet (180 mg total) by mouth daily.  30 tablet  6  . fluticasone (FLONASE) 50 MCG/ACT nasal spray Place 2 sprays into the nose daily.  16 g  6  . hyoscyamine (ANASPAZ) 0.125 MG TBDP Place 0.125 mg under the tongue as needed.        . Lactulose 20 GM/30ML SOLN Take 15 mLs (10 g total) by mouth every 6 (six) hours as needed (for relief of constipation).  240 mL  0  . meclizine (ANTIVERT) 25 MG tablet Take 25 mg by mouth 2 (two) times daily as needed.        . Milnacipran (SAVELLA) 50 MG TABS Take 1 tablet (50 mg total) by mouth 2 (two) times daily.  60 tablet  2  . montelukast (SINGULAIR) 10 MG tablet Take 1 tablet (10 mg total) by mouth at bedtime.  90 tablet  1  . sulfamethoxazole-trimethoprim (BACTRIM DS) 800-160 MG per tablet Take 1 tablet by mouth 2 (two) times daily.  6 tablet  0  . triamterene-hydrochlorothiazide (MAXZIDE-25) 37.5-25 MG per tablet TAKE ONE TABLET BY MOUTH ONE TIME DAILY  90 tablet  2  . [DISCONTINUED] ergocalciferol (DRISDOL) 50000 UNITS capsule Take 1 capsule (50,000 Units total) by mouth once a week.  4 capsule  0  . dicyclomine (BENTYL) 20 MG tablet Take 1 tablet (20 mg total) by mouth 4 (four) times daily -  before meals and at bedtime.  120 tablet  1  . ergocalciferol (DRISDOL) 50000 UNITS capsule Take 1 capsule (50,000 Units total) by mouth once a week.  12 capsule  0  . phentermine (ADIPEX-P) 37.5 MG tablet Take 0.5 tablets (18.75 mg total) by mouth 2 (two) times daily.  30 tablet  2  . [DISCONTINUED] phentermine (ADIPEX-P) 37.5 MG tablet Take 0.5 tablets (18.75 mg total) by  mouth 2 (two) times daily.  30 tablet  2  . [DISCONTINUED] Phentermine HCl 30 MG TBDP Take 15 mg by mouth 2 (two) times daily. Before meals  30 tablet  2     No orders of the defined types were placed in this encounter.    No Follow-up on file.

## 2012-01-31 NOTE — Assessment & Plan Note (Signed)
She has been following a low glycemic index diet but only about 4 times daily keep. She is exercising regularly with walking. Trial of phentermine for 3 months. Risks and benefits of temporary use of this  medication discussed.

## 2012-01-31 NOTE — Patient Instructions (Addendum)
Please continue the diet and daily exercise while you are taking the phentermine  Return in 3 months ., but e ma il me your weights monthly

## 2012-01-31 NOTE — Assessment & Plan Note (Signed)
Pain syndrome has improved with Savella. She is exercising regularly.

## 2012-02-17 ENCOUNTER — Encounter: Payer: Self-pay | Admitting: Internal Medicine

## 2012-02-22 ENCOUNTER — Encounter: Payer: Self-pay | Admitting: Internal Medicine

## 2012-03-19 ENCOUNTER — Encounter: Payer: Self-pay | Admitting: Internal Medicine

## 2012-03-21 ENCOUNTER — Other Ambulatory Visit: Payer: Self-pay | Admitting: *Deleted

## 2012-03-22 ENCOUNTER — Encounter: Payer: Self-pay | Admitting: Internal Medicine

## 2012-03-22 MED ORDER — MILNACIPRAN HCL 50 MG PO TABS: 50.0000 mg | ORAL_TABLET | Freq: Two times a day (BID) | ORAL | Status: DC

## 2012-03-22 NOTE — Telephone Encounter (Signed)
Rx faxed to pharmacy  

## 2012-03-24 ENCOUNTER — Encounter: Payer: Self-pay | Admitting: Internal Medicine

## 2012-03-28 ENCOUNTER — Encounter: Payer: Self-pay | Admitting: Internal Medicine

## 2012-03-29 MED ORDER — MILNACIPRAN HCL 50 MG PO TABS: 50.0000 mg | ORAL_TABLET | Freq: Two times a day (BID) | ORAL | Status: DC

## 2012-04-12 ENCOUNTER — Encounter: Payer: Self-pay | Admitting: Internal Medicine

## 2012-04-18 ENCOUNTER — Institutional Professional Consult (permissible substitution): Payer: BC Managed Care – PPO | Admitting: Pulmonary Disease

## 2012-04-21 ENCOUNTER — Encounter: Payer: Self-pay | Admitting: Internal Medicine

## 2012-04-21 ENCOUNTER — Ambulatory Visit (INDEPENDENT_AMBULATORY_CARE_PROVIDER_SITE_OTHER): Payer: BC Managed Care – PPO | Admitting: Internal Medicine

## 2012-04-21 VITALS — BP 136/90 | HR 95 | Temp 98.0°F | Resp 16 | Wt 196.5 lb

## 2012-04-21 DIAGNOSIS — E669 Obesity, unspecified: Secondary | ICD-10-CM

## 2012-04-21 DIAGNOSIS — H15009 Unspecified scleritis, unspecified eye: Secondary | ICD-10-CM | POA: Diagnosis not present

## 2012-04-21 DIAGNOSIS — H15101 Unspecified episcleritis, right eye: Secondary | ICD-10-CM | POA: Insufficient documentation

## 2012-04-21 NOTE — Patient Instructions (Addendum)
I am referring you to [facility] to rule out episcleritis and corneal abrasion as the cause for your persistent eye irritation

## 2012-04-21 NOTE — Assessment & Plan Note (Addendum)
Vs  Corneal abrasion given that she has had no response to topical and systemic antihistamines.  Refer to [facility] for evaluation

## 2012-04-21 NOTE — Assessment & Plan Note (Addendum)
30 lb wt loss since nov 2012. 17 of which was sent early January when we added phentemrine to low gi diet . She has had no adverse effects from the phentermine. We will continue the medication for another 3 months.

## 2012-04-21 NOTE — Progress Notes (Signed)
Patient ID: Barbara Wade, female   DOB: April 18, 1960, 52 y.o.   MRN: [id number]  Patient Active Problem List  Diagnosis  . History of colon polyps  . Screening for breast cancer  . Anaphylaxis  . Fibromyalgia  . Edema  . Hepatitis B virus infection  . Hyperlipidemia LDL goal < 70  . Diabetes mellitus type 2, diet-controlled  . Lymphedema of leg  . Rhinitis, allergic  . Urinary frequency  . Hand pain, right  . Pain of left heel  . Vitamin D deficiency  . Obesity (BMI 30-39.9)  . Episcleritis of right eye  . Irritable bowel syndrome (IBS)    Subjective:  CC:   Chief Complaint  Patient presents with  . Eye Problem    feels like grit in eye did not actually get anything in eye.    HPI:   Barbara Wade a 52 y.o. female who presents Red irritated right eye since Monday  No improvement with consistent use of topical eye drops for allergies.  He denies any recent visit in changes or trauma to the eye. She does not wear contact lenses. She describes the pain as a gritty feeling whenever she moves her eye. . She has had no sick contacts. She does not wear contact lenses s or allergies.   Past Medical History  Diagnosis Date  . Allergy   . Hypertension   . Sleep apnea   . Anaphylaxis 2007    occurred during allergy testing to environmental allergen  . Neuromuscular disorder   . Fibromyalgia 2004  . Depression     managed currently with [facility]  . Hepatitis B virus infection   . Diabetes mellitus     new onset,  hgb1c 6.2  . Irritable bowel syndrome (IBS)     Past Surgical History  Procedure Laterality Date  . Knee arthroscopy    . Trigger finger release    . Cholecystectomy      elective  . Abdominal hysterectomy  2000    for fibroids       The following portions of the patient's history were reviewed and updated as appropriate: Allergies, current medications, and problem list.    Review of Systems:   Patient denies headache,  fevers, malaise, unintentional weight loss, skin rash, eye pain, sinus congestion and sinus pain, sore throat, dysphagia,  hemoptysis , cough, dyspnea, wheezing, chest pain, palpitations, orthopnea, edema, abdominal pain, nausea, melena, diarrhea, constipation, flank pain, dysuria, hematuria, urinary  Frequency, nocturia, numbness, tingling, seizures,  Focal weakness, Loss of consciousness,  Tremor, insomnia, depression, anxiety, and suicidal ideation.     History   Social History  . Marital Status: Married    Spouse Name: Gaylyn Rong     Number of Children: 2  . Years of Education: 16 yrs    Occupational History  . disabled     sec to meniere's 2009   Social History Main Topics  . Smoking status: Former Smoker    Quit date: 10/16/2002  . Smokeless tobacco: Never Used  . Alcohol Use: Yes     Comment: occasional  . Drug Use: No  . Sexually Active: Not on file   Other Topics Concern  . Not on file   Social History Narrative  . No narrative on file    Objective:  BP 136/90  Pulse 95  Temp(Src) 98 F (36.7 C) (Oral)  Resp 16  Wt 196 lb 8 oz (89.132 kg)  BMI 34.82 kg/m2  SpO2 97%  General appearance: alert, cooperative and appears stated age Eyes: right sclera injected, erythematous.  Pupil reactive. Vision unchanged. Left eye normal.  Ears: normal TM's and external ear canals both ears Neck: no adenopathy, no carotid bruit, supple, symmetrical, trachea midline and thyroid not enlarged, symmetric, no tenderness/mass/nodules Lungs: clear to auscultation bilaterally Heart: regular rate and rhythm, S1, S2 normal, no murmur, click, rub or gallop  Assessment and Plan:  Episcleritis of right eye Vs  Corneal abrasion given that she has had no response to topical and systemic antihistamines.  Refer to [facility] for evaluation   Obesity (BMI 30-39.9) 30 lb wt loss since nov 2012. 17 of which was sent early January when we added phentemrine to low gi diet . She has had no adverse  effects from the phentermine. We will continue the medication for another 3 months.   Updated Medication List Outpatient Encounter Prescriptions as of 04/21/2012  Medication Sig Dispense Refill  . EPINEPHrine (EPIPEN) 0.3 mg/0.3 mL DEVI Inject 0.3 mLs (0.3 mg total) into the muscle once.  1 Device  6  . ergocalciferol (DRISDOL) 50000 UNITS capsule Take 1 capsule (50,000 Units total) by mouth once a week.  4 capsule  2  . ergocalciferol (DRISDOL) 50000 UNITS capsule Take 1 capsule (50,000 Units total) by mouth once a week.  12 capsule  0  . ergocalciferol (DRISDOL) 50000 UNITS capsule Take 1 capsule (50,000 Units total) by mouth once a week.  4 capsule  0  . fexofenadine (ALLEGRA) 180 MG tablet Take 1 tablet (180 mg total) by mouth daily.  30 tablet  6  . fluticasone (FLONASE) 50 MCG/ACT nasal spray Place 2 sprays into the nose daily.  16 g  6  . hyoscyamine (ANASPAZ) 0.125 MG TBDP Place 0.125 mg under the tongue as needed.        . Lactulose 20 GM/30ML SOLN Take 15 mLs (10 g total) by mouth every 6 (six) hours as needed (for relief of constipation).  240 mL  0  . meclizine (ANTIVERT) 25 MG tablet Take 25 mg by mouth 2 (two) times daily as needed.        . Milnacipran (SAVELLA) 50 MG TABS Take 1 tablet (50 mg total) by mouth 2 (two) times daily.  180 tablet  3  . montelukast (SINGULAIR) 10 MG tablet Take 1 tablet (10 mg total) by mouth at bedtime.  90 tablet  1  . phentermine (ADIPEX-P) 37.5 MG tablet Take 0.5 tablets (18.75 mg total) by mouth 2 (two) times daily.  30 tablet  2  . sulfamethoxazole-trimethoprim (BACTRIM DS) 800-160 MG per tablet Take 1 tablet by mouth 2 (two) times daily.  6 tablet  0  . triamterene-hydrochlorothiazide (MAXZIDE-25) 37.5-25 MG per tablet TAKE ONE TABLET BY MOUTH ONE TIME DAILY  90 tablet  2  . dicyclomine (BENTYL) 20 MG tablet Take 1 tablet (20 mg total) by mouth 4 (four) times daily -  before meals and at bedtime.  120 tablet  1   No facility-administered  encounter medications on file as of 04/21/2012.     Orders Placed This Encounter  Procedures  . Ambulatory referral to Ophthalmology    No Follow-up on file.

## 2012-04-23 ENCOUNTER — Encounter: Payer: Self-pay | Admitting: Internal Medicine

## 2012-04-23 DIAGNOSIS — K589 Irritable bowel syndrome without diarrhea: Secondary | ICD-10-CM | POA: Insufficient documentation

## 2012-04-25 DIAGNOSIS — H903 Sensorineural hearing loss, bilateral: Secondary | ICD-10-CM | POA: Diagnosis not present

## 2012-04-25 DIAGNOSIS — H8109 Meniere's disease, unspecified ear: Secondary | ICD-10-CM | POA: Diagnosis not present

## 2012-04-25 DIAGNOSIS — H832X9 Labyrinthine dysfunction, unspecified ear: Secondary | ICD-10-CM | POA: Diagnosis not present

## 2012-04-28 DIAGNOSIS — H15009 Unspecified scleritis, unspecified eye: Secondary | ICD-10-CM | POA: Diagnosis not present

## 2012-05-02 ENCOUNTER — Ambulatory Visit (INDEPENDENT_AMBULATORY_CARE_PROVIDER_SITE_OTHER): Payer: BC Managed Care – PPO | Admitting: Internal Medicine

## 2012-05-02 ENCOUNTER — Encounter: Payer: Self-pay | Admitting: Internal Medicine

## 2012-05-02 VITALS — BP 116/72 | HR 90 | Temp 97.6°F | Resp 16 | Wt 197.2 lb

## 2012-05-02 DIAGNOSIS — H15009 Unspecified scleritis, unspecified eye: Secondary | ICD-10-CM | POA: Diagnosis not present

## 2012-05-02 DIAGNOSIS — M7710 Lateral epicondylitis, unspecified elbow: Secondary | ICD-10-CM | POA: Diagnosis not present

## 2012-05-02 DIAGNOSIS — H15101 Unspecified episcleritis, right eye: Secondary | ICD-10-CM

## 2012-05-02 DIAGNOSIS — E669 Obesity, unspecified: Secondary | ICD-10-CM | POA: Diagnosis not present

## 2012-05-02 DIAGNOSIS — Z23 Encounter for immunization: Secondary | ICD-10-CM

## 2012-05-02 DIAGNOSIS — L719 Rosacea, unspecified: Secondary | ICD-10-CM | POA: Insufficient documentation

## 2012-05-02 DIAGNOSIS — M7712 Lateral epicondylitis, left elbow: Secondary | ICD-10-CM

## 2012-05-02 MED ORDER — LEVOFLOXACIN 500 MG PO TABS: 500.0000 mg | ORAL_TABLET | Freq: Every day | ORAL | Status: DC

## 2012-05-02 MED ORDER — METRONIDAZOLE 1 % EX GEL: Freq: Every day | CUTANEOUS | Status: DC

## 2012-05-02 MED ORDER — OMEPRAZOLE 40 MG PO CPDR: 40.0000 mg | DELAYED_RELEASE_CAPSULE | Freq: Every day | ORAL | Status: DC

## 2012-05-02 MED ORDER — IBUPROFEN 800 MG PO TABS: 800.0000 mg | ORAL_TABLET | Freq: Three times a day (TID) | ORAL | Status: DC | PRN

## 2012-05-02 MED ORDER — PHENTERMINE HCL 37.5 MG PO TABS: 18.0000 mg | ORAL_TABLET | Freq: Two times a day (BID) | ORAL | Status: DC

## 2012-05-02 MED ORDER — CELECOXIB 200 MG PO CAPS: 200.0000 mg | ORAL_CAPSULE | Freq: Two times a day (BID) | ORAL | Status: DC

## 2012-05-02 NOTE — Assessment & Plan Note (Signed)
Improving clinically. She's continues to have some scratchy feeling but the irritation is gone. Continue steroid

## 2012-05-02 NOTE — Assessment & Plan Note (Addendum)
Improved BMI with appetite suppressant and low carbohydrate diet.

## 2012-05-02 NOTE — Assessment & Plan Note (Signed)
Nonsteroidal anti-inflammatories, ice, armband, and home PT. Exercises given.

## 2012-05-02 NOTE — Patient Instructions (Addendum)
1) You have tendonitis of the elbow (lateral epicondylitis)  This is treated with a forearm band,  Anti inflammatories,  And direct application of ice Take omeprazole to protect your stomach  .  celebrex 200 mg twice daily until samples are gone.  THEN use ibuprofen 800 m three times daily (rx called to pharmacy) The arm band can be  Purchased at any athletic store .(ask for tennis elbow arm band) Avoid using a screwdriver or unscrewing jars for two weeks    2) Your facial rash appears to be rosacea.  This can be treated with a topical ointment called metrogel.

## 2012-05-02 NOTE — Progress Notes (Signed)
Patient ID: Barbara Wade, female   DOB: May 29, 1960, 52 y.o.   MRN: [id number]   Patient Active Problem List  Diagnosis  . History of colon polyps  . Screening for breast cancer  . Anaphylaxis  . Fibromyalgia  . Edema  . Hepatitis B virus infection  . Hyperlipidemia LDL goal < 70  . Diabetes mellitus type 2, diet-controlled  . Lymphedema of leg  . Rhinitis, allergic  . Urinary frequency  . Hand pain, right  . Pain of left heel  . Vitamin D deficiency  . Obesity (BMI 30-39.9)  . Episcleritis of right eye  . Irritable bowel syndrome (IBS)  . Epicondylitis, lateral (tennis elbow)  . Rosacea    Subjective:  CC:   Chief Complaint  Patient presents with  . Follow-up    HPI:   Barbara Wade a 52 y.o. female who presents Followup on multiple chronic and acute issues.   1) She was evaluated last week urgently for erythematous sclera and was diagnosed with episcleritis. I sent her over to [facility] for evaluation and they concurred and been treating her with topical steroids. She states that the redness is improved quickly but the pain in the eye is still present but improving slowly. She states that she had a similar episode a year ago.  2) she's developed a red macular rash covering both cheeks. She does not moisturize her face on regular basis. The rash is aggravated by spicy food and ask him I. She has not tried anything for it. It does not itch.  3) left lateral elbow pain of uncertain etiology. She has no history of trauma. She's been having episodes of pain for many months but for the last 2 weeks the pain has been nearly unbearable. After prolonged questioning she was trying to open a very tight drawer several weeks ago and this is what aggravated her pain.   4) obesity. she has been taking phentermine with good tolerance and has been able to finally losing weight using a low carbohydrate diet. She would like status for several more  months.    Past Medical History  Diagnosis Date  . Allergy   . Hypertension   . Sleep apnea   . Anaphylaxis 2007    occurred during allergy testing to environmental allergen  . Neuromuscular disorder   . Fibromyalgia 2004  . Depression     managed currently with [facility]  . Hepatitis B virus infection   . Diabetes mellitus     new onset,  hgb1c 6.2  . Irritable bowel syndrome (IBS)     Past Surgical History  Procedure Laterality Date  . Knee arthroscopy    . Trigger finger release    . Cholecystectomy      elective  . Abdominal hysterectomy  2000    for fibroids       The following portions of the patient's history were reviewed and updated as appropriate: Allergies, current medications, and problem list.    Review of Systems:  Patient denies headache, fevers, malaise, unintentional weight loss, skin rash, eye pain, sinus congestion and sinus pain, sore throat, dysphagia,  hemoptysis , cough, dyspnea, wheezing, chest pain, palpitations, orthopnea, edema, abdominal pain, nausea, melena, diarrhea, constipation, flank pain, dysuria, hematuria, urinary  Frequency, nocturia, numbness, tingling, seizures,  Focal weakness, Loss of consciousness,  Tremor, insomnia, depression, anxiety, and suicidal ideation.     History   Social History  . Marital Status: Married  Spouse Name: Barbara Wade     Number of Children: 2  . Years of Education: 16 yrs    Occupational History  . disabled     sec to meniere's 2009   Social History Main Topics  . Smoking status: Former Smoker    Quit date: 10/16/2002  . Smokeless tobacco: Never Used  . Alcohol Use: Yes     Comment: occasional  . Drug Use: No  . Sexually Active: Not on file   Other Topics Concern  . Not on file   Social History Narrative  . No narrative on file    Objective:  BP 116/72  Pulse 90  Temp(Src) 97.6 F (36.4 C) (Oral)  Resp 16  Wt 197 lb 4 oz (89.472 kg)  BMI 34.95 kg/m2  SpO2 97%  General  appearance: alert, cooperative and appears stated age Ears: normal TM's and external ear canals both ears Throat: lips, mucosa, and tongue normal; teeth and gums normal Neck: no adenopathy, no carotid bruit, supple, symmetrical, trachea midline and thyroid not enlarged, symmetric, no tenderness/mass/nodules Back: symmetric, no curvature. ROM normal. No CVA tenderness. Lungs: clear to auscultation bilaterally Heart: regular rate and rhythm, S1, S2 normal, no murmur, click, rub or gallop Abdomen: soft, non-tender; bowel sounds normal; no masses,  no organomegaly Pulses: 2+ and symmetric Skin: Skin color, texture, turgor normal. No rashes or lesions Lymph nodes: Cervical, supraclavicular, and axillary nodes normal.  Assessment and Plan:  Episcleritis of right eye Improving clinically. She's continues to have some scratchy feeling but the irritation is gone. Continue steroid  Obesity (BMI 30-39.9) Improved BMI with appetite suppressant and low carbohydrate diet.  Epicondylitis, lateral (tennis elbow) Nonsteroidal anti-inflammatories, ice, armband, and home PT. Exercises given.  Rosacea Chronic. Trial of MetroGel. Patient education that given.  A total of 40 minutes was spent with patient more than half of which was spent in counseling, reviewing records from other prviders and coordination of care.  Updated Medication List Outpatient Encounter Prescriptions as of 05/02/2012  Medication Sig Dispense Refill  . EPINEPHrine (EPIPEN) 0.3 mg/0.3 mL DEVI Inject 0.3 mLs (0.3 mg total) into the muscle once.  1 Device  6  . ergocalciferol (DRISDOL) 50000 UNITS capsule Take 1 capsule (50,000 Units total) by mouth once a week.  12 capsule  0  . fexofenadine (ALLEGRA) 180 MG tablet Take 1 tablet (180 mg total) by mouth daily.  30 tablet  6  . fluticasone (FLONASE) 50 MCG/ACT nasal spray Place 2 sprays into the nose daily.  16 g  6  . hyoscyamine (ANASPAZ) 0.125 MG TBDP Place 0.125 mg under the tongue  as needed.        . Lactulose 20 GM/30ML SOLN Take 15 mLs (10 g total) by mouth every 6 (six) hours as needed (for relief of constipation).  240 mL  0  . meclizine (ANTIVERT) 25 MG tablet Take 25 mg by mouth 2 (two) times daily as needed.        . Milnacipran (SAVELLA) 50 MG TABS Take 1 tablet (50 mg total) by mouth 2 (two) times daily.  180 tablet  3  . montelukast (SINGULAIR) 10 MG tablet Take 1 tablet (10 mg total) by mouth at bedtime.  90 tablet  1  . phentermine (ADIPEX-P) 37.5 MG tablet Take 0.5 tablets (18.75 mg total) by mouth 2 (two) times daily.  90 tablet  0  . triamterene-hydrochlorothiazide (MAXZIDE-25) 37.5-25 MG per tablet TAKE ONE TABLET BY MOUTH ONE TIME DAILY  90 tablet  2  . [DISCONTINUED] ergocalciferol (DRISDOL) 50000 UNITS capsule Take 1 capsule (50,000 Units total) by mouth once a week.  4 capsule  2  . [DISCONTINUED] phentermine (ADIPEX-P) 37.5 MG tablet Take 0.5 tablets (18.75 mg total) by mouth 2 (two) times daily.  30 tablet  2  . [DISCONTINUED] phentermine (ADIPEX-P) 37.5 MG tablet Take 0.5 tablets (18.75 mg total) by mouth 2 (two) times daily.  30 tablet  2  . [DISCONTINUED] phentermine (ADIPEX-P) 37.5 MG tablet Take 0.5 tablets (18.75 mg total) by mouth 2 (two) times daily.  30 tablet  2  . celecoxib (CELEBREX) 200 MG capsule Take 1 capsule (200 mg total) by mouth 2 (two) times daily.  6 capsule  0  . ibuprofen (ADVIL,MOTRIN) 800 MG tablet Take 1 tablet (800 mg total) by mouth every 8 (eight) hours as needed for pain.  60 tablet  1  . levofloxacin (LEVAQUIN) 500 MG tablet Take 1 tablet (500 mg total) by mouth daily.  7 tablet  0  . metroNIDAZOLE (METROGEL) 1 % gel Apply topically daily.  45 g  0  . omeprazole (PRILOSEC) 40 MG capsule Take 1 capsule (40 mg total) by mouth daily.  30 capsule  3  . [DISCONTINUED] dicyclomine (BENTYL) 20 MG tablet Take 1 tablet (20 mg total) by mouth 4 (four) times daily -  before meals and at bedtime.  120 tablet  1  . [DISCONTINUED]  ergocalciferol (DRISDOL) 50000 UNITS capsule Take 1 capsule (50,000 Units total) by mouth once a week.  4 capsule  0  . [DISCONTINUED] sulfamethoxazole-trimethoprim (BACTRIM DS) 800-160 MG per tablet Take 1 tablet by mouth 2 (two) times daily.  6 tablet  0   No facility-administered encounter medications on file as of 05/02/2012.     Orders Placed This Encounter  Procedures  . Pneumococcal polysaccharide vaccine 23-valent greater than or equal to 2yo subcutaneous/IM    No Follow-up on file.

## 2012-05-02 NOTE — Assessment & Plan Note (Addendum)
Chronic. Trial of MetroGel. Patient education that given.

## 2012-05-05 ENCOUNTER — Other Ambulatory Visit: Payer: BC Managed Care – PPO

## 2012-05-12 ENCOUNTER — Encounter: Payer: Self-pay | Admitting: Internal Medicine

## 2012-05-15 MED ORDER — FEXOFENADINE HCL 180 MG PO TABS: 180.0000 mg | ORAL_TABLET | Freq: Every day | ORAL | Status: DC

## 2012-05-19 ENCOUNTER — Encounter: Payer: Self-pay | Admitting: Internal Medicine

## 2012-05-19 DIAGNOSIS — H15009 Unspecified scleritis, unspecified eye: Secondary | ICD-10-CM | POA: Diagnosis not present

## 2012-05-19 NOTE — Telephone Encounter (Signed)
Spoke with pt, complaining of same symptoms as 04/21/12 visit but symptoms are worse, right eye inflamed, pain, and having blurry vision. States called [facility] yesterday, unable to be seen until Monday. Advised pt to call again this morning and let them know worsening symptoms and blurred vision to see if she can get an appointment today. Advised to call back if unable to get an appointment today.

## 2012-05-25 DIAGNOSIS — H15009 Unspecified scleritis, unspecified eye: Secondary | ICD-10-CM | POA: Diagnosis not present

## 2012-06-28 ENCOUNTER — Encounter: Payer: Self-pay | Admitting: Internal Medicine

## 2012-07-10 ENCOUNTER — Encounter: Payer: Self-pay | Admitting: Internal Medicine

## 2012-07-10 DIAGNOSIS — H169 Unspecified keratitis: Secondary | ICD-10-CM | POA: Diagnosis not present

## 2012-07-10 MED ORDER — OLOPATADINE HCL 0.1 % OP SOLN: 1.0000 [drp] | Freq: Two times a day (BID) | OPHTHALMIC | Status: DC

## 2012-07-17 DIAGNOSIS — H169 Unspecified keratitis: Secondary | ICD-10-CM | POA: Diagnosis not present

## 2012-07-21 ENCOUNTER — Encounter: Payer: Self-pay | Admitting: Internal Medicine

## 2012-07-31 DIAGNOSIS — H04129 Dry eye syndrome of unspecified lacrimal gland: Secondary | ICD-10-CM | POA: Diagnosis not present

## 2012-08-04 ENCOUNTER — Telehealth: Payer: Self-pay | Admitting: Internal Medicine

## 2012-08-04 MED ORDER — PHENTERMINE HCL 37.5 MG PO TABS: 18.0000 mg | ORAL_TABLET | Freq: Two times a day (BID) | ORAL | Status: DC

## 2012-08-04 NOTE — Telephone Encounter (Signed)
Rx faxed to pharmacy  

## 2012-08-04 NOTE — Telephone Encounter (Signed)
Ok to refill phentermine for 90 days [facility]  Patient needs to have office appt prior to next 90 day refill.  please e mail her

## 2012-08-08 ENCOUNTER — Other Ambulatory Visit (INDEPENDENT_AMBULATORY_CARE_PROVIDER_SITE_OTHER): Payer: Self-pay

## 2012-09-21 LAB — HM DIABETES EYE EXAM: HM Diabetic Eye Exam: NORMAL

## 2012-09-22 ENCOUNTER — Other Ambulatory Visit: Payer: Self-pay | Admitting: Internal Medicine

## 2012-10-26 ENCOUNTER — Encounter: Payer: Self-pay | Admitting: Internal Medicine

## 2012-10-26 MED ORDER — MONTELUKAST SODIUM 10 MG PO TABS: 10.0000 mg | ORAL_TABLET | Freq: Every day | ORAL | Status: DC

## 2012-10-27 ENCOUNTER — Ambulatory Visit: Payer: BC Managed Care – PPO | Admitting: Internal Medicine

## 2012-10-31 ENCOUNTER — Encounter: Payer: Self-pay | Admitting: Internal Medicine

## 2012-10-31 ENCOUNTER — Ambulatory Visit (INDEPENDENT_AMBULATORY_CARE_PROVIDER_SITE_OTHER): Payer: BC Managed Care – PPO | Admitting: Internal Medicine

## 2012-10-31 VITALS — BP 112/70 | HR 91 | Temp 98.7°F | Resp 14 | Ht 63.0 in | Wt 181.5 lb

## 2012-10-31 DIAGNOSIS — R29898 Other symptoms and signs involving the musculoskeletal system: Secondary | ICD-10-CM

## 2012-10-31 DIAGNOSIS — H15101 Unspecified episcleritis, right eye: Secondary | ICD-10-CM

## 2012-10-31 DIAGNOSIS — E669 Obesity, unspecified: Secondary | ICD-10-CM

## 2012-10-31 DIAGNOSIS — R071 Chest pain on breathing: Secondary | ICD-10-CM | POA: Diagnosis not present

## 2012-10-31 DIAGNOSIS — IMO0001 Reserved for inherently not codable concepts without codable children: Secondary | ICD-10-CM | POA: Diagnosis not present

## 2012-10-31 DIAGNOSIS — M797 Fibromyalgia: Secondary | ICD-10-CM

## 2012-10-31 DIAGNOSIS — E119 Type 2 diabetes mellitus without complications: Secondary | ICD-10-CM

## 2012-10-31 DIAGNOSIS — M948X9 Other specified disorders of cartilage, unspecified sites: Secondary | ICD-10-CM

## 2012-10-31 DIAGNOSIS — R0789 Other chest pain: Secondary | ICD-10-CM

## 2012-10-31 DIAGNOSIS — H15009 Unspecified scleritis, unspecified eye: Secondary | ICD-10-CM

## 2012-10-31 MED ORDER — PHENTERMINE HCL 37.5 MG PO TABS: 18.0000 mg | ORAL_TABLET | Freq: Two times a day (BID) | ORAL | Status: DC

## 2012-10-31 NOTE — Patient Instructions (Addendum)
You have done very well on the phentermine and low GI diet  Your plateau is because of the watermelon and the  Exercise intensity   Your chest wall tenderness is being evaluated with a chest x ray to look at the breastbone

## 2012-11-01 ENCOUNTER — Encounter: Payer: Self-pay | Admitting: Internal Medicine

## 2012-11-01 DIAGNOSIS — R1013 Epigastric pain: Secondary | ICD-10-CM | POA: Insufficient documentation

## 2012-11-01 MED ORDER — MILNACIPRAN HCL 50 MG PO TABS: 50.0000 mg | ORAL_TABLET | Freq: Two times a day (BID) | ORAL | Status: DC

## 2012-11-01 NOTE — Assessment & Plan Note (Signed)
With tenderness to palpation . No history of trauma.  X ray ordered

## 2012-11-01 NOTE — Assessment & Plan Note (Signed)
Improved with trial of Patanol.

## 2012-11-01 NOTE — Assessment & Plan Note (Signed)
Will increase the Savella to 75 mg bid.

## 2012-11-01 NOTE — Progress Notes (Signed)
Patient ID: Barbara Wade, female   DOB: [birth year], 52 y.o.   MRN: [id number]  Patient Active Problem List   Diagnosis Date Noted  . Rosacea 05/02/2012  . Irritable bowel syndrome (IBS)   . Episcleritis of right eye 04/21/2012  . Obesity (BMI 30-39.9) 01/31/2012  . Vitamin D deficiency 01/05/2012  . Hand pain, right 12/15/2011  . Pain of left heel 12/15/2011  . Urinary frequency 10/30/2011  . Lymphedema of leg 07/17/2011  . Rhinitis, allergic 07/17/2011  . Diabetes mellitus type 2, diet-controlled   . Hyperlipidemia LDL goal < 70 12/02/2010  . Hepatitis B virus infection   . Edema 10/18/2010  . History of colon polyps 10/16/2010  . Screening for breast cancer 10/16/2010  . Anaphylaxis   . Fibromyalgia     Subjective:  CC:   Chief Complaint  Patient presents with  . Follow-up    area of chest tender to touch. like a soreness.    HPI:   Barbara Wade a 52 y.o. female who presents Follow up on multiple conditions including obesity and fibromyalgia.  She has been tolerating phentemrine and following a low glycemic index diet and has lost 32 lbs since January,  Or 15% of her body weight.  She feels generally well except that her fibromyalgia is flaring for the last 2 weeks and her pain has  Been less tolerable..  She is taking Savella , which initially was very effective in managing her pain . She hs tenderness on her sternum  With  No history of trauma or unusual activity       Past Medical History  Diagnosis Date  . Allergy   . Hypertension   . Sleep apnea   . Anaphylaxis 2007    occurred during allergy testing to environmental allergen  . Neuromuscular disorder   . Fibromyalgia 2004  . Depression     managed currently with [facility]  . Hepatitis B virus infection   . Diabetes mellitus     new onset,  hgb1c 6.2  . Irritable bowel syndrome (IBS)     Past Surgical History  Procedure Laterality Date  . Knee arthroscopy    . Trigger  finger release    . Cholecystectomy      elective  . Abdominal hysterectomy  2000    for fibroids       The following portions of the patient's history were reviewed and updated as appropriate: Allergies, current medications, and problem list.    Review of Systems:   12 Pt  review of systems was negative except those addressed in the HPI,     History   Social History  . Marital Status: Married    Spouse Name: Barbara Wade     Number of Children: 2  . Years of Education: 16 yrs    Occupational History  . disabled     sec to meniere's 2009   Social History Main Topics  . Smoking status: Former Smoker    Quit date: 10/16/2002  . Smokeless tobacco: Never Used  . Alcohol Use: Yes     Comment: occasional  . Drug Use: No  . Sexual Activity: Yes   Other Topics Concern  . Not on file   Social History Narrative  . No narrative on file    Objective:  Filed Vitals:   10/31/12 1607  BP: 112/70  Pulse: 91  Temp: 98.7 F (37.1 C)  Resp: 14     General appearance:  alert, cooperative and appears stated age Ears: normal TM's and external ear canals both ears Throat: lips, mucosa, and tongue normal; teeth and gums normal Neck: no adenopathy, no carotid bruit, supple, symmetrical, trachea midline and thyroid not enlarged, symmetric, no tenderness/mass/nodules Back: symmetric, no curvature. ROM normal. No CVA tenderness. Lungs: clear to auscultation bilaterally Heart: regular rate and rhythm, S1, S2 normal, no murmur, click, rub or gallop Abdomen: soft, non-tender; bowel sounds normal; no masses,  no organomegaly Pulses: 2+ and symmetric Skin: Skin color, texture, turgor normal. No rashes or lesions Lymph nodes: Cervical, supraclavicular, and axillary nodes normal.  Assessment and Plan: Fibromyalgia Will increase the Savella to 75 mg bid.   Episcleritis of right eye Improved with trial of Patanol.   Diabetes mellitus type 2, diet-controlled Lab Results  Component  Value Date   HGBA1C 6.1 12/13/2011   She will return for fasting labs   Obesity (BMI 30-39.9) She has lst 15% of her body weight but has plateaued recently  . Diet reviewed,  She is eating watermelon daily.  And not exercising enough. Low GI foods illustrated,  Exercise recommended.  Xiphoid prominence With tenderness to palpation . No history of trauma.  X ray ordered   A total of 40 minutes was spent with patient more than half of which was spent in counseling, reviewing records from other prviders and coordination of care.   Updated Medication List Outpatient Encounter Prescriptions as of 10/31/2012  Medication Sig Dispense Refill  . EPINEPHrine (EPIPEN) 0.3 mg/0.3 mL DEVI Inject 0.3 mLs (0.3 mg total) into the muscle once.  1 Device  6  . fexofenadine (ALLEGRA) 180 MG tablet Take 1 tablet (180 mg total) by mouth daily.  30 tablet  6  . hyoscyamine (ANASPAZ) 0.125 MG TBDP Place 0.125 mg under the tongue as needed.        . Lactulose 20 GM/30ML SOLN Take 15 mLs (10 g total) by mouth every 6 (six) hours as needed (for relief of constipation).  240 mL  0  . meclizine (ANTIVERT) 25 MG tablet Take 25 mg by mouth 2 (two) times daily as needed.        . metroNIDAZOLE (METROGEL) 1 % gel Apply topically daily.  45 g  0  . Milnacipran (SAVELLA) 50 MG TABS tablet Take 1 tablet (50 mg total) by mouth 2 (two) times daily.  180 tablet  3  . montelukast (SINGULAIR) 10 MG tablet Take 1 tablet (10 mg total) by mouth at bedtime.  90 tablet  1  . olopatadine (PATANOL) 0.1 % ophthalmic solution Place 1 drop into both eyes 2 (two) times daily.  5 mL  12  . phentermine (ADIPEX-P) 37.5 MG tablet Take 0.5 tablets (18.75 mg total) by mouth 2 (two) times daily.  90 tablet  3  . triamterene-hydrochlorothiazide (MAXZIDE-25) 37.5-25 MG per tablet Take one tablet by mouth one time daily  90 tablet  1  . [DISCONTINUED] phentermine (ADIPEX-P) 37.5 MG tablet Take 0.5 tablets (18.75 mg total) by mouth 2 (two) times  daily.  90 tablet  0  . celecoxib (CELEBREX) 200 MG capsule Take 1 capsule (200 mg total) by mouth 2 (two) times daily.  6 capsule  0  . ergocalciferol (DRISDOL) 50000 UNITS capsule Take 1 capsule (50,000 Units total) by mouth once a week.  12 capsule  0  . fluticasone (FLONASE) 50 MCG/ACT nasal spray Place 2 sprays into the nose daily.  16 g  6  . ibuprofen (ADVIL,MOTRIN) 800  MG tablet Take 1 tablet (800 mg total) by mouth every 8 (eight) hours as needed for pain.  60 tablet  1  . levofloxacin (LEVAQUIN) 500 MG tablet Take 1 tablet (500 mg total) by mouth daily.  7 tablet  0  . omeprazole (PRILOSEC) 40 MG capsule Take 1 capsule (40 mg total) by mouth daily.  30 capsule  3  . [DISCONTINUED] Milnacipran (SAVELLA) 50 MG TABS Take 1 tablet (50 mg total) by mouth 2 (two) times daily.  180 tablet  3   No facility-administered encounter medications on file as of 10/31/2012.

## 2012-11-01 NOTE — Assessment & Plan Note (Addendum)
Lab Results  Component Value Date   HGBA1C 6.1 12/13/2011   She will return for fasting labs

## 2012-11-01 NOTE — Assessment & Plan Note (Signed)
She has lst 15% of her body weight but has plateaued recently  . Diet reviewed,  She is eating watermelon daily.  And not exercising enough. Low GI foods illustrated,  Exercise recommended.

## 2012-11-03 ENCOUNTER — Telehealth: Payer: Self-pay | Admitting: Internal Medicine

## 2012-11-03 ENCOUNTER — Ambulatory Visit: Payer: Self-pay | Admitting: Internal Medicine

## 2012-11-03 NOTE — Telephone Encounter (Signed)
Pt sent my chart message wanting [place] to make her mammogram appointment

## 2012-11-05 ENCOUNTER — Telehealth: Payer: Self-pay | Admitting: Internal Medicine

## 2012-11-05 NOTE — Telephone Encounter (Signed)
The chest x ray was normal.  The bones were reported as normal.

## 2012-11-05 NOTE — Telephone Encounter (Signed)
See prior message tha t didn't get routed

## 2012-11-06 ENCOUNTER — Encounter: Payer: Self-pay | Admitting: Internal Medicine

## 2012-11-06 ENCOUNTER — Telehealth: Payer: Self-pay | Admitting: Internal Medicine

## 2012-11-06 ENCOUNTER — Encounter: Payer: Self-pay | Admitting: *Deleted

## 2012-11-06 DIAGNOSIS — R29898 Other symptoms and signs involving the musculoskeletal system: Secondary | ICD-10-CM

## 2012-11-06 DIAGNOSIS — R0789 Other chest pain: Secondary | ICD-10-CM

## 2012-11-06 NOTE — Telephone Encounter (Signed)
Letter mailed

## 2012-11-06 NOTE — Telephone Encounter (Signed)
The patient is aware her xray was normal. She is wonder what this lump could be that is located right at her sternum. Please advise.

## 2012-11-06 NOTE — Telephone Encounter (Signed)
Pt aware.

## 2012-11-07 ENCOUNTER — Encounter: Payer: Self-pay | Admitting: Internal Medicine

## 2012-11-07 NOTE — Telephone Encounter (Signed)
Referral to Dr Katrinka Blazing  Sports medicine. In process

## 2012-11-07 NOTE — Telephone Encounter (Signed)
Pt notified, no preference to orthopedist, ok with referral.

## 2012-11-07 NOTE — Telephone Encounter (Signed)
I am not sure .  It may be arthritis.  I would like her to see an orthopedist for a second opinion.  Does she have a preference?

## 2012-11-10 ENCOUNTER — Encounter: Payer: Self-pay | Admitting: Family Medicine

## 2012-11-10 ENCOUNTER — Ambulatory Visit (INDEPENDENT_AMBULATORY_CARE_PROVIDER_SITE_OTHER): Payer: BC Managed Care – PPO | Admitting: Family Medicine

## 2012-11-10 VITALS — BP 132/84 | HR 86 | Wt 180.0 lb

## 2012-11-10 DIAGNOSIS — M948X9 Other specified disorders of cartilage, unspecified sites: Secondary | ICD-10-CM

## 2012-11-10 DIAGNOSIS — R29898 Other symptoms and signs involving the musculoskeletal system: Secondary | ICD-10-CM

## 2012-11-10 NOTE — Patient Instructions (Addendum)
Very nice to meet you You have a prominent xiphoid.  Ibuprofen 800mg  three times a day for 3 days Tyleonl if needded Arnica topically Ice 10 minutes 2 times a day could be helpful.  Vitamin D 1000IU Fish oil 3 grams daily If not better in 2 weeks or worsening come back and see me  Black Cohash can help for hot flashes.    Costochondritis Costochondritis (Tietze syndrome), or costochondral separation, is a swelling and irritation (inflammation) of the tissue (cartilage) that connects your ribs with your breastbone (sternum). It may occur on its own (spontaneously), through damage caused by an accident (trauma), or simply from coughing or minor exercise. It may take up to 6 weeks to get better and longer if you are unable to be conservative in your activities. HOME CARE INSTRUCTIONS   Avoid exhausting physical activity. Try not to strain your ribs during normal activity. This would include any activities using chest, belly (abdominal), and side muscles, especially if heavy weights are used.  Use ice for 15-20 minutes per hour while awake for the first 2 days. Place the ice in a plastic bag, and place a towel between the bag of ice and your skin.  Only take over-the-counter or prescription medicines for pain, discomfort, or fever as directed by your caregiver. SEEK IMMEDIATE MEDICAL CARE IF:   Your pain increases or you are very uncomfortable.  You have a fever.  You develop difficulty with your breathing.  You cough up blood.  You develop worse chest pains, shortness of breath, sweating, or vomiting.  You develop new, unexplained problems (symptoms). MAKE SURE YOU:   Understand these instructions.  Will watch your condition.  Will get help right away if you are not doing well or get worse. Document Released: 10/21/2004 Document Revised: 04/05/2011 Document Reviewed: 08/30/2007 [facility] Patient Information 2014 [place], [place].

## 2012-11-10 NOTE — Progress Notes (Signed)
  I'm seeing this patient by the request  of:  TULLO,TERESA, MD   CC: Chest wall pain  HPI: Patient is a very pleasant 52 year old female with a past medical history significant for fibromyalgia, diabetes, as well as recent successful weight loss. Patient states that approximately 2 weeks ago she started having some tenderness intermittent chest. Patient states that this seemed to be just below the breast line. Patient states very tender to palpation and felt like there was potentially a lump occurring. Patient was seen by primary care provider who thought this was potentially arthritis and was sent here for further evaluation. Patient states that over the course of time it seems to be improving slowly. Patient has not done any O. modalities. Patient describes the more of a dull aching sensation when touching and can have sharp pain if pushcarts enough. Patient denies any nausea or vomiting, denies any shortness of breath or any radiation to the arms or mid back. Severity 6/10.  Past medical, surgical, family and social history reviewed. Medications reviewed all in the electronic medical record.   Review of Systems: No headache, visual changes, nausea, vomiting, diarrhea, constipation, dizziness, abdominal pain, skin rash, fevers, chills, night sweats, weight loss, swollen lymph nodes, body aches, joint swelling, muscle aches, chest pain, shortness of breath, mood changes.   Objective:    Blood pressure 132/84, pulse 86, weight 180 lb (81.647 kg), SpO2 95.00%.   General: No apparent distress alert and oriented x3 mood and affect normal, dressed appropriately.  HEENT: Pupils equal, extraocular movements intact Respiratory: Patient's speak in full sentences and does not appear short of breath Cardiovascular: No lower extremity edema, non tender, no erythema Skin: Warm dry intact with no signs of infection or rash on extremities or on axial skeleton. Abdomen: Soft nontender Neuro: Cranial nerves II  through XII are intact, neurovascularly intact in all extremities with 2+ DTRs and 2+ pulses. Lymph: No lymphadenopathy of posterior or anterior cervical chain or axillae bilaterally.  Gait normal with good balance and coordination.  MSK: Non tender with full range of motion and good stability and symmetric strength and tone of shoulders, elbows, wrist, hip, knee and ankles bilaterally.  Chest wall exam shows the patient is tender to palpation at the proximal aspect of the xiphoid. This seems to be fairly superficial. There is some mild fluctuance in this area but no true mass appreciated. No signs of abscess formation. Skin seems to be mild redness secondary to palpation. No signs of infection.   Impression and Recommendations:     This case required medical decision making of moderate complexity.

## 2012-11-10 NOTE — Assessment & Plan Note (Signed)
Patient does have some xiphoid process with some mild inflammatory changes in the area that I think are self-induced from self palpation. Discuss with this starting 2 weeks and potentially associated with some of her allergic rhinitis this could be early costochondritis. Discuss different treatment options. Patient decided she will try over-the-counter anti-inflammatories for short bursts of 3 days. In addition we talked about other topical medications that can be helpful. Patient will try these and see if she responds. If not we would consider doing a five-day burst of prednisone to see if we get any improvement with the inflammatory process. I did review patient's x-rays previously that did not show any signs of osteoarthritic changes and no signs of fracture. I think that due to patient's recent weight loss this is just became more prominent. Patient in followup on an as-needed basis.

## 2012-11-12 ENCOUNTER — Encounter: Payer: Self-pay | Admitting: Internal Medicine

## 2012-11-21 ENCOUNTER — Encounter: Payer: Self-pay | Admitting: Internal Medicine

## 2012-11-29 DIAGNOSIS — Z1231 Encounter for screening mammogram for malignant neoplasm of breast: Secondary | ICD-10-CM | POA: Diagnosis not present

## 2012-11-30 ENCOUNTER — Other Ambulatory Visit: Payer: Self-pay

## 2012-12-11 ENCOUNTER — Encounter: Payer: Self-pay | Admitting: Internal Medicine

## 2012-12-20 ENCOUNTER — Ambulatory Visit: Payer: BC Managed Care – PPO | Admitting: Internal Medicine

## 2012-12-20 ENCOUNTER — Encounter: Payer: Self-pay | Admitting: Internal Medicine

## 2012-12-20 ENCOUNTER — Ambulatory Visit (INDEPENDENT_AMBULATORY_CARE_PROVIDER_SITE_OTHER): Payer: BC Managed Care – PPO | Admitting: Internal Medicine

## 2012-12-20 ENCOUNTER — Telehealth: Payer: Self-pay | Admitting: Internal Medicine

## 2012-12-20 VITALS — BP 118/76 | HR 84 | Temp 98.2°F | Resp 12 | Ht 63.0 in | Wt 183.0 lb

## 2012-12-20 DIAGNOSIS — E559 Vitamin D deficiency, unspecified: Secondary | ICD-10-CM

## 2012-12-20 DIAGNOSIS — E119 Type 2 diabetes mellitus without complications: Secondary | ICD-10-CM | POA: Diagnosis not present

## 2012-12-20 DIAGNOSIS — IMO0001 Reserved for inherently not codable concepts without codable children: Secondary | ICD-10-CM

## 2012-12-20 DIAGNOSIS — Z23 Encounter for immunization: Secondary | ICD-10-CM

## 2012-12-20 DIAGNOSIS — E785 Hyperlipidemia, unspecified: Secondary | ICD-10-CM | POA: Diagnosis not present

## 2012-12-20 DIAGNOSIS — R29898 Other symptoms and signs involving the musculoskeletal system: Secondary | ICD-10-CM

## 2012-12-20 DIAGNOSIS — M797 Fibromyalgia: Secondary | ICD-10-CM

## 2012-12-20 DIAGNOSIS — E669 Obesity, unspecified: Secondary | ICD-10-CM

## 2012-12-20 DIAGNOSIS — M948X9 Other specified disorders of cartilage, unspecified sites: Secondary | ICD-10-CM

## 2012-12-20 DIAGNOSIS — R5381 Other malaise: Secondary | ICD-10-CM | POA: Diagnosis not present

## 2012-12-20 DIAGNOSIS — F39 Unspecified mood [affective] disorder: Secondary | ICD-10-CM

## 2012-12-20 DIAGNOSIS — F338 Other recurrent depressive disorders: Secondary | ICD-10-CM | POA: Insufficient documentation

## 2012-12-20 DIAGNOSIS — R7989 Other specified abnormal findings of blood chemistry: Secondary | ICD-10-CM | POA: Diagnosis not present

## 2012-12-20 LAB — COMPREHENSIVE METABOLIC PANEL
ALT: 25 U/L (ref 0–35)
AST: 24 U/L (ref 0–37)
Albumin: 3.7 g/dL (ref 3.5–5.2)
Alkaline Phosphatase: 108 U/L (ref 39–117)
BUN: 19 mg/dL (ref 6–23)
Glucose, Bld: 104 mg/dL — ABNORMAL HIGH (ref 70–99)
Sodium: 137 mEq/L (ref 135–145)
Total Bilirubin: 0.6 mg/dL (ref 0.3–1.2)
Total Protein: 7.2 g/dL (ref 6.0–8.3)

## 2012-12-20 LAB — TRANSFERRIN: Transferrin: 259.4 mg/dL (ref 212.0–360.0)

## 2012-12-20 LAB — MICROALBUMIN / CREATININE URINE RATIO
Creatinine,U: 41.5 mg/dL
Microalb, Ur: 1.1 mg/dL (ref 0.0–1.9)

## 2012-12-20 LAB — FERRITIN: Ferritin: 245.3 ng/mL (ref 10.0–291.0)

## 2012-12-20 LAB — LDL CHOLESTEROL, DIRECT: Direct LDL: 129.9 mg/dL

## 2012-12-20 LAB — HM DIABETES FOOT EXAM: HM Diabetic Foot Exam: NORMAL

## 2012-12-20 LAB — TSH: TSH: 1.75 u[IU]/mL (ref 0.35–5.50)

## 2012-12-20 MED ORDER — TRIAMTERENE-HCTZ 37.5-25 MG PO TABS: ORAL_TABLET | ORAL | Status: DC

## 2012-12-20 MED ORDER — BUPROPION HCL ER (SR) 150 MG PO TB12: 150.0000 mg | ORAL_TABLET | Freq: Every day | ORAL | Status: DC

## 2012-12-20 MED ORDER — MILNACIPRAN HCL 100 MG PO TABS: 100.0000 mg | ORAL_TABLET | Freq: Two times a day (BID) | ORAL | Status: DC

## 2012-12-20 NOTE — Patient Instructions (Signed)
We are increasing the Savella to 100 mg twice daily for your FM  Trial of low dose Wellbutrin Sr for depression/seasonal affective disorder Start with one dose daily in the morning,  Add the second daily dose after a few days (8 hours after first dose)   Follow up after  2 weeks with either e mail or office visit

## 2012-12-20 NOTE — Assessment & Plan Note (Addendum)
Persistent bilateral leg pain ,  Increase savella to 100 mg today

## 2012-12-20 NOTE — Telephone Encounter (Signed)
Pt came in wanting to get a refill on triamterene-hctz 37.5-25 mg Take one tablet by mouth one time daily target

## 2012-12-20 NOTE — Progress Notes (Signed)
Patient ID: Barbara Wade, female   DOB: December 08, 1960, 52 y.o.   MRN: [id number]   Patient Active Problem List   Diagnosis Date Noted  . Seasonal affective disorder 12/20/2012  . Xiphoid prominence 11/01/2012  . Rosacea 05/02/2012  . Irritable bowel syndrome (IBS)   . Episcleritis of right eye 04/21/2012  . Obesity (BMI 30-39.9) 01/31/2012  . Vitamin D deficiency 01/05/2012  . Hand pain, right 12/15/2011  . Pain of left heel 12/15/2011  . Urinary frequency 10/30/2011  . Lymphedema of leg 07/17/2011  . Rhinitis, allergic 07/17/2011  . Diabetes mellitus type 2, diet-controlled   . Hyperlipidemia LDL goal < 70 12/02/2010  . Hepatitis B virus infection   . Edema 10/18/2010  . History of Wade polyps 10/16/2010  . Screening for breast cancer 10/16/2010  . Anaphylaxis   . Fibromyalgia     Subjective:  CC:   Chief Complaint  Patient presents with  . Depression  . Leg Pain    hip pain    HPI:   [name] a 52 y.o. female who presents with depressive symptoms.  She has been feeling "down in the dumps" for the past 2 months. Avoiding social contact,  crying more,  sleeping a lot.  She is not suicidal,  Cites no  specific trigger, but recalls that she starts to feel this way every year around this time  Her FM has also been less well controlled despite prior increased to  75 mg Savella and she is having more persistent  le   Past Medical History  Diagnosis Date  . Allergy   . Hypertension   . Sleep apnea   . Anaphylaxis 2007    occurred during allergy testing to environmental allergen  . Neuromuscular disorder   . Fibromyalgia 2004  . Depression     managed currently with [facility]  . Hepatitis B virus infection   . Diabetes mellitus     new onset,  hgb1c 6.2  . Irritable bowel syndrome (IBS)     Past Surgical History  Procedure Laterality Date  . Knee arthroscopy    . Trigger finger release    . Cholecystectomy      elective  .  Abdominal hysterectomy  2000    for fibroids       The following portions of the patient's history were reviewed and updated as appropriate: Allergies, current medications, and problem list.    Review of Systems:   12 Pt  review of systems was negative except those addressed in the HPI,     History   Social History  . Marital Status: Married    Spouse Name: Gaylyn Rong     Number of Children: 2  . Years of Education: 16 yrs    Occupational History  . disabled     sec to meniere's 2009   Social History Main Topics  . Smoking status: Former Smoker    Quit date: 10/16/2002  . Smokeless tobacco: Never Used  . Alcohol Use: Yes     Comment: occasional  . Drug Use: No  . Sexual Activity: Yes   Other Topics Concern  . Not on file   Social History Narrative  . No narrative on file    Objective:  Filed Vitals:   12/20/12 1400  BP: 118/76  Pulse: 84  Temp: 98.2 F (36.8 C)  Resp: 12     General appearance: alert, cooperative and appears stated age Ears: normal TM's and  external ear canals both ears Throat: lips, mucosa, and tongue normal; teeth and gums normal Neck: no adenopathy, no carotid bruit, supple, symmetrical, trachea midline and thyroid not enlarged, symmetric, no tenderness/mass/nodules Back: symmetric, no curvature. ROM normal. No CVA tenderness. Lungs: clear to auscultation bilaterally Heart: regular rate and rhythm, S1, S2 normal, no murmur, click, rub or gallop Abdomen: soft, non-tender; bowel sounds normal; no masses,  no organomegaly Pulses: 2+ and symmetric Skin: Skin color, texture, turgor normal. No rashes or lesions Lymph nodes: Cervical, supraclavicular, and axillary nodes normal.  Assessment and Plan:  Fibromyalgia Persistent bilateral leg pain ,  Increase savella to 100 mg today  Xiphoid prominence With tenderness to palpation . No history of trauma.  X ray was normal.  Referred to Terrilee Files    Seasonal affective disorder Trial  of wellbutrin offered. . Risks and benefits of medication discussed.   Return in one month   Obesity (BMI 30-39.9) Her weight has plateaued after losing 15% of body weight with appetite suppression.  If weight loss does not resume at one month follow up,  phentermine will be discontinued. She has had prior evaluation by [facility] multidisciplinary group  Nov 2013.  Diabetes mellitus type 2, diet-controlled Well-controlled on diet alone .  hemoglobin A1c has been consistently less than 7.0  Since diagnosis in November 2012. Patient is up-to-date on eye exams and foot exam was done today.  There is  no proteinuria on today's micro urinalysis .  Fasting lipids have been reviewed and statin therapy has been advised and updated according to new ACC guidelines based on patient's 10 year risk of CAD    Updated Medication List Outpatient Encounter Prescriptions as of 12/20/2012  Medication Sig  . BLACK COHOSH PO Take 1,600 mg by mouth daily.  . Cholecalciferol (VITAMIN D3) 1000 UNITS CAPS Take 1 capsule by mouth daily.  Marland Kitchen EPINEPHrine (EPIPEN) 0.3 mg/0.3 mL DEVI Inject 0.3 mLs (0.3 mg total) into the muscle once.  . fexofenadine (ALLEGRA) 180 MG tablet Take 1 tablet (180 mg total) by mouth daily.  . hyoscyamine (ANASPAZ) 0.125 MG TBDP Place 0.125 mg under the tongue as needed.    . Lactulose 20 GM/30ML SOLN Take 15 mLs (10 g total) by mouth every 6 (six) hours as needed (for relief of constipation).  . meclizine (ANTIVERT) 25 MG tablet Take 25 mg by mouth 2 (two) times daily as needed.    . metroNIDAZOLE (METROGEL) 1 % gel Apply topically daily.  . Milnacipran (SAVELLA) 50 MG TABS tablet Take 1 tablet (50 mg total) by mouth 2 (two) times daily.  . montelukast (SINGULAIR) 10 MG tablet Take 1 tablet (10 mg total) by mouth at bedtime.  Marland Kitchen olopatadine (PATANOL) 0.1 % ophthalmic solution Place 1 drop into both eyes 2 (two) times daily.  . phentermine (ADIPEX-P) 37.5 MG tablet Take 0.5 tablets  (18.75 mg total) by mouth 2 (two) times daily.  Marland Kitchen triamterene-hydrochlorothiazide (MAXZIDE-25) 37.5-25 MG per tablet Take one tablet by mouth one time daily  . [DISCONTINUED] triamterene-hydrochlorothiazide (MAXZIDE-25) 37.5-25 MG per tablet Take one tablet by mouth one time daily  . buPROPion (WELLBUTRIN SR) 150 MG 12 hr tablet Take 1 tablet (150 mg total) by mouth daily. For 3 days,  Then add second dose in afternoon  . fluticasone (FLONASE) 50 MCG/ACT nasal spray Place 2 sprays into the nose daily.  Marland Kitchen ibuprofen (ADVIL,MOTRIN) 800 MG tablet Take 1 tablet (800 mg total) by mouth every 8 (eight) hours as needed  for pain.  . Milnacipran HCl (SAVELLA) 100 MG TABS tablet Take 1 tablet (100 mg total) by mouth 2 (two) times daily.  Marland Kitchen omeprazole (PRILOSEC) 40 MG capsule Take 1 capsule (40 mg total) by mouth daily.  . [DISCONTINUED] celecoxib (CELEBREX) 200 MG capsule Take 1 capsule (200 mg total) by mouth 2 (two) times daily.  . [DISCONTINUED] ergocalciferol (DRISDOL) 50000 UNITS capsule Take 1 capsule (50,000 Units total) by mouth once a week.  . [DISCONTINUED] levofloxacin (LEVAQUIN) 500 MG tablet Take 1 tablet (500 mg total) by mouth daily.     Orders Placed This Encounter  Procedures  . LDL cholesterol, direct  . TSH  . Vit D  25 hydroxy (rtn osteoporosis monitoring)  . HM DIABETES FOOT EXAM    No Follow-up on file.

## 2012-12-21 LAB — VITAMIN D 25 HYDROXY (VIT D DEFICIENCY, FRACTURES): Vit D, 25-Hydroxy: 39 ng/mL (ref 30–89)

## 2012-12-21 LAB — IRON AND TIBC
Iron: 109 ug/dL (ref 42–145)
TIBC: 327 ug/dL (ref 250–470)
UIBC: 218 ug/dL (ref 125–400)

## 2012-12-22 ENCOUNTER — Encounter: Payer: Self-pay | Admitting: Internal Medicine

## 2012-12-22 NOTE — Assessment & Plan Note (Signed)
Trial of wellbutrin offered. . Risks and benefits of medication discussed.   Return in one month

## 2012-12-22 NOTE — Assessment & Plan Note (Signed)
Well-controlled on diet alone .  hemoglobin A1c has been consistently less than 7.0  Since diagnosis in November 2012. Patient is up-to-date on eye exams and foot exam was done today.  There is  no proteinuria on today's micro urinalysis .  Fasting lipids have been reviewed and statin therapy has been advised and updated according to new ACC guidelines based on patient's 10 year risk of CAD

## 2012-12-22 NOTE — Assessment & Plan Note (Addendum)
Her weight has plateaued after losing 15% of body weight with appetite suppression.  If weight loss does not resume at one month follow up,  phentermine will be discontinued. She has had prior evaluation by [facility] multidisciplinary group  Nov 2013.

## 2012-12-22 NOTE — Assessment & Plan Note (Signed)
With tenderness to palpation . No history of trauma.  X ray was normal.  Referred to Terrilee Files

## 2012-12-28 ENCOUNTER — Encounter: Payer: Self-pay | Admitting: Internal Medicine

## 2012-12-28 ENCOUNTER — Encounter: Payer: Self-pay | Admitting: Emergency Medicine

## 2012-12-28 DIAGNOSIS — L719 Rosacea, unspecified: Secondary | ICD-10-CM

## 2012-12-28 NOTE — Telephone Encounter (Signed)
Please cancel  patients appt for next week

## 2013-01-02 ENCOUNTER — Ambulatory Visit: Payer: BC Managed Care – PPO | Admitting: Internal Medicine

## 2013-01-12 ENCOUNTER — Encounter: Payer: Self-pay | Admitting: Internal Medicine

## 2013-02-11 ENCOUNTER — Encounter: Payer: Self-pay | Admitting: Internal Medicine

## 2013-02-12 ENCOUNTER — Other Ambulatory Visit: Payer: Self-pay | Admitting: *Deleted

## 2013-02-12 MED ORDER — MILNACIPRAN HCL 100 MG PO TABS: 100.0000 mg | ORAL_TABLET | Freq: Two times a day (BID) | ORAL | Status: DC

## 2013-02-14 ENCOUNTER — Other Ambulatory Visit: Payer: Self-pay | Admitting: *Deleted

## 2013-03-09 ENCOUNTER — Encounter: Payer: Self-pay | Admitting: Adult Health

## 2013-03-09 ENCOUNTER — Ambulatory Visit (INDEPENDENT_AMBULATORY_CARE_PROVIDER_SITE_OTHER): Payer: BC Managed Care – PPO | Admitting: Adult Health

## 2013-03-09 VITALS — BP 110/70 | HR 79 | Temp 98.2°F | Resp 16 | Wt 187.0 lb

## 2013-03-09 DIAGNOSIS — R110 Nausea: Secondary | ICD-10-CM

## 2013-03-09 MED ORDER — PROMETHAZINE HCL 25 MG PO TABS: 25.0000 mg | ORAL_TABLET | Freq: Three times a day (TID) | ORAL | Status: DC | PRN

## 2013-03-09 NOTE — Progress Notes (Signed)
   Subjective:    Patient ID: Barbara Wade, female    DOB: May 06, 1960, 53 y.o.   MRN: [id number]  HPI  Patient is a pleasant 53 year old female who presents to clinic with nausea. She reports that a week ago she had nausea and vomiting as well as diarrhea. The vomiting and diarrhea have stopped however she is still quite nauseated. She is tolerating liquids. Does not feel dehydrated. No other symptoms reported.  Review of Systems  Gastrointestinal: Positive for nausea. Negative for vomiting, abdominal pain, diarrhea and abdominal distention.  Neurological: Negative.   Psychiatric/Behavioral: Negative.   All other systems reviewed and are negative.       Objective:   Physical Exam  Constitutional: She is oriented to person, place, and time. No distress.  Cardiovascular: Normal rate and regular rhythm.   Pulmonary/Chest: Effort normal. No respiratory distress.  Abdominal: Soft. Bowel sounds are normal. She exhibits no mass. There is no tenderness. There is no rebound and no guarding.  Musculoskeletal: Normal range of motion.  Lymphadenopathy:    She has no cervical adenopathy.  Neurological: She is alert and oriented to person, place, and time.  Skin: Skin is warm and dry.  Psychiatric: She has a normal mood and affect. Her behavior is normal. Judgment and thought content normal.          Assessment & Plan:   1. Nausea alone Pt is tolerating fluids. No vomiting or diarrhea. She can take phenergan 25 mg every 8 hours as needed for nausea. Advance diet as tolerated

## 2013-03-09 NOTE — Patient Instructions (Signed)
Phenergan 25 mg every 8 hours as needed for nausea   Nausea, Adult Nausea is the feeling that you have an upset stomach or have to vomit. Nausea by itself is not likely a serious concern, but it may be an early sign of more serious medical problems. As nausea gets worse, it can lead to vomiting. If vomiting develops, there is the risk of dehydration.  CAUSES   Viral infections.  Food poisoning.  Medicines.  Pregnancy.  Motion sickness.  Migraine headaches.  Emotional distress.  Severe pain from any source.  Alcohol intoxication. HOME CARE INSTRUCTIONS  Get plenty of rest.  Ask your caregiver about specific rehydration instructions.  Eat small amounts of food and sip liquids more often.  Take all medicines as told by your caregiver. SEEK MEDICAL CARE IF:  You have not improved after 2 days, or you get worse.  You have a headache. SEEK IMMEDIATE MEDICAL CARE IF:   You have a fever.  You faint.  You keep vomiting or have blood in your vomit.  You are extremely weak or dehydrated.  You have dark or bloody stools.  You have severe chest or abdominal pain. MAKE SURE YOU:  Understand these instructions.  Will watch your condition.  Will get help right away if you are not doing well or get worse. Document Released: 02/19/2004 Document Revised: 10/06/2011 Document Reviewed: 09/23/2010 [facility] Patient Information 2014 [place], [place].

## 2013-03-09 NOTE — Progress Notes (Signed)
Pre visit review using our clinic review tool, if applicable. No additional management support is needed unless otherwise documented below in the visit note. 

## 2013-03-16 ENCOUNTER — Other Ambulatory Visit: Payer: Self-pay | Admitting: Internal Medicine

## 2013-03-30 ENCOUNTER — Other Ambulatory Visit: Payer: Self-pay | Admitting: Internal Medicine

## 2013-03-30 ENCOUNTER — Encounter: Payer: Self-pay | Admitting: Internal Medicine

## 2013-03-30 MED ORDER — LEVOFLOXACIN 500 MG PO TABS: 500.0000 mg | ORAL_TABLET | Freq: Every day | ORAL | Status: DC

## 2013-05-02 ENCOUNTER — Encounter: Payer: Self-pay | Admitting: Internal Medicine

## 2013-05-11 ENCOUNTER — Encounter: Payer: Self-pay | Admitting: Internal Medicine

## 2013-05-15 ENCOUNTER — Other Ambulatory Visit: Payer: Self-pay | Admitting: Internal Medicine

## 2013-05-15 NOTE — Telephone Encounter (Signed)
30 day refill only,  Needs OV prior to more refills

## 2013-05-15 NOTE — Telephone Encounter (Signed)
Okay to refill? 

## 2013-05-16 NOTE — Telephone Encounter (Signed)
Script placed up front and left patient message to call office patient needs OV for further refills.

## 2013-06-01 ENCOUNTER — Encounter: Payer: Self-pay | Admitting: Internal Medicine

## 2013-06-28 ENCOUNTER — Telehealth: Payer: Self-pay | Admitting: Internal Medicine

## 2013-07-03 NOTE — Telephone Encounter (Signed)
Disregard my note, pt sent another message after this one "Please DO NOT send a refill to [facility]. Disregard my prior email please. Thank you ~Shanice"

## 2013-07-03 NOTE — Telephone Encounter (Signed)
Pt had requested Savella refill. Notified of need for appt, has not scheduled, is in [place] until mid-June. Ok to send 3 month supply?

## 2013-07-14 ENCOUNTER — Other Ambulatory Visit: Payer: Self-pay | Admitting: Internal Medicine

## 2013-07-18 ENCOUNTER — Ambulatory Visit (INDEPENDENT_AMBULATORY_CARE_PROVIDER_SITE_OTHER): Payer: BC Managed Care – PPO | Admitting: Adult Health

## 2013-07-18 ENCOUNTER — Encounter: Payer: Self-pay | Admitting: Adult Health

## 2013-07-18 VITALS — BP 128/72 | HR 83 | Temp 98.3°F | Resp 14 | Wt 189.5 lb

## 2013-07-18 DIAGNOSIS — M2550 Pain in unspecified joint: Secondary | ICD-10-CM | POA: Diagnosis not present

## 2013-07-18 LAB — URIC ACID: Uric Acid, Serum: 7.4 mg/dL — ABNORMAL HIGH (ref 2.4–7.0)

## 2013-07-18 NOTE — Progress Notes (Signed)
Pre visit review using our clinic review tool, if applicable. No additional management support is needed unless otherwise documented below in the visit note. 

## 2013-07-18 NOTE — Progress Notes (Signed)
Patient ID: Barbara Wade, female   DOB: [birth year], 53 y.o.   MRN: [id number]   Subjective:    Patient ID: Barbara Wade, female    DOB: 10-29-1960, 53 y.o.   MRN: [id number]  HPI  Patient is a 53 year old female who presents to clinic with left index finger joint pain that has been ongoing for > 1 month. She was in [place] and was seen by a physician there. Labs were unremarkable. Her CRP was only minimally elevated. ANA was negative. She reports that the swelling has improved. Reports "feels like a trigger finger". No other symptom associated with this. She does not have decreased ROM.  Past Medical History  Diagnosis Date  . Allergy   . Hypertension   . Sleep apnea   . Anaphylaxis 2007    occurred during allergy testing to environmental allergen  . Neuromuscular disorder   . Fibromyalgia 2004  . Depression     managed currently with [facility]  . Hepatitis B virus infection   . Diabetes mellitus     new onset,  hgb1c 6.2  . Irritable bowel syndrome (IBS)     Current Outpatient Prescriptions on File Prior to Visit  Medication Sig Dispense Refill  . BLACK COHOSH PO Take 1,600 mg by mouth daily.      . Cholecalciferol (VITAMIN D3) 1000 UNITS CAPS Take 2 capsules by mouth daily.       Marland Kitchen EPINEPHrine (EPIPEN) 0.3 mg/0.3 mL DEVI Inject 0.3 mLs (0.3 mg total) into the muscle once.  1 Device  6  . fluticasone (FLONASE) 50 MCG/ACT nasal spray Place 2 sprays into the nose daily.      . hyoscyamine (ANASPAZ) 0.125 MG TBDP Place 0.125 mg under the tongue as needed.        . Lactulose 20 GM/30ML SOLN Take 15 mLs (10 g total) by mouth every 6 (six) hours as needed (for relief of constipation).  240 mL  0  . levofloxacin (LEVAQUIN) 500 MG tablet Take 1 tablet (500 mg total) by mouth daily.  7 tablet  0  . meclizine (ANTIVERT) 25 MG tablet Take 25 mg by mouth 2 (two) times daily as needed.        . montelukast (SINGULAIR) 10 MG tablet TAKE 1 TABLET AT BEDTIME  90  tablet  1  . olopatadine (PATANOL) 0.1 % ophthalmic solution Place 1 drop into both eyes 2 (two) times daily.  5 mL  12  . phentermine (ADIPEX-P) 37.5 MG tablet TAKE ONE-HALF TABLET BY MOUTH TWICE DAILY   90 tablet  0  . promethazine (PHENERGAN) 25 MG tablet Take 1 tablet (25 mg total) by mouth every 8 (eight) hours as needed for nausea or vomiting.  20 tablet  0  . SAVELLA 100 MG TABS tablet TAKE 1 TABLET TWICE A DAY  60 tablet  0  . triamterene-hydrochlorothiazide (MAXZIDE-25) 37.5-25 MG per tablet TAKE ONE TABLET BY MOUTH ONE TIME DAILY   30 tablet  0  . fexofenadine (ALLEGRA) 180 MG tablet Take 1 tablet (180 mg total) by mouth daily.  30 tablet  6   No current facility-administered medications on file prior to visit.     Review of Systems Positive for: pain of index finger of the left hand. Negative for: decreased ROM, erythema All other systems reviewed and negative.    Objective:  BP 128/72  Pulse 83  Temp(Src) 98.3 F (36.8 C) (Oral)  Resp 14  Wt 189 lb  8 oz (85.957 kg)  SpO2 96%   Physical Exam  Constitutional: She is oriented to person, place, and time. No distress.  Cardiovascular: Normal rate and regular rhythm.   Pulmonary/Chest: Effort normal. No respiratory distress.  Musculoskeletal: Normal range of motion. She exhibits tenderness. She exhibits no edema.  Pain with palpation of left index finger metacarpophalangeal joint. Minimal swelling.  Neurological: She is alert and oriented to person, place, and time.  Skin: Skin is warm and dry.  Psychiatric: She has a normal mood and affect. Her behavior is normal. Judgment and thought content normal.      Assessment & Plan:   1. Joint pain Pain at left index metacarpophalangeal joint. She has been evaluated by physician in [place] with labs normal. CRP was only minimally elevated. ANA negative. I will check uric acid levels to see if possibly gout may be the problem. We discussed having xray or ultrasound to r/o  cyst. Negative for injury. - Uric acid

## 2013-07-18 NOTE — Patient Instructions (Signed)
  I am checking your uric acid levels to see if possible gout.  We will contact you with results once they're available.  If these are normal we can consider doing an x-ray or ultrasound of your hand to see if there is a cyst.

## 2013-07-19 ENCOUNTER — Encounter: Payer: Self-pay | Admitting: Adult Health

## 2013-08-27 ENCOUNTER — Ambulatory Visit: Payer: Medicare Other | Admitting: Adult Health

## 2013-08-29 ENCOUNTER — Encounter: Payer: Self-pay | Admitting: Internal Medicine

## 2013-08-29 DIAGNOSIS — E119 Type 2 diabetes mellitus without complications: Secondary | ICD-10-CM

## 2013-08-29 DIAGNOSIS — Z79899 Other long term (current) drug therapy: Secondary | ICD-10-CM

## 2013-08-29 DIAGNOSIS — E785 Hyperlipidemia, unspecified: Secondary | ICD-10-CM

## 2013-08-29 MED ORDER — TRIAMTERENE-HCTZ 37.5-25 MG PO TABS: ORAL_TABLET | ORAL | Status: DC

## 2013-08-29 NOTE — Addendum Note (Signed)
Addended by: Wynonia Lawman E on: 08/29/2013 02:38 PM   Modules accepted: Orders

## 2013-08-29 NOTE — Telephone Encounter (Signed)
Pt coming in prior to her appt with you for fasting labs. I ordered a1c, cmet, lipid. Please add anything else she may need

## 2013-09-07 ENCOUNTER — Other Ambulatory Visit (INDEPENDENT_AMBULATORY_CARE_PROVIDER_SITE_OTHER): Payer: BC Managed Care – PPO

## 2013-09-07 DIAGNOSIS — E119 Type 2 diabetes mellitus without complications: Secondary | ICD-10-CM

## 2013-09-07 LAB — COMPREHENSIVE METABOLIC PANEL
ALK PHOS: 98 U/L (ref 39–117)
ALT: 31 U/L (ref 0–35)
AST: 28 U/L (ref 0–37)
Albumin: 4.1 g/dL (ref 3.5–5.2)
BUN: 26 mg/dL — ABNORMAL HIGH (ref 6–23)
CO2: 29 meq/L (ref 19–32)
Calcium: 10.5 mg/dL (ref 8.4–10.5)
Chloride: 99 mEq/L (ref 96–112)
Creatinine, Ser: 0.9 mg/dL (ref 0.4–1.2)
GFR: 71.74 mL/min (ref >60.00)
Glucose, Bld: 110 mg/dL — ABNORMAL HIGH (ref 70–99)
Potassium: 4.4 mEq/L (ref 3.5–5.1)
SODIUM: 136 meq/L (ref 135–145)
TOTAL PROTEIN: 7.3 g/dL (ref 6.0–8.3)
Total Bilirubin: 0.8 mg/dL (ref 0.2–1.2)

## 2013-09-07 LAB — LIPID PANEL
Cholesterol: 231 mg/dL — ABNORMAL HIGH (ref 0–200)
HDL: 47.3 mg/dL (ref >39.00)
LDL Cholesterol: 156 mg/dL — ABNORMAL HIGH (ref 0–99)
NONHDL: 183.7
Total CHOL/HDL Ratio: 5
Triglycerides: 140 mg/dL (ref 0.0–149.0)
VLDL: 28 mg/dL (ref 0.0–40.0)

## 2013-09-07 LAB — HEMOGLOBIN A1C: HEMOGLOBIN A1C: 5.6 % (ref 4.6–6.5)

## 2013-09-10 ENCOUNTER — Ambulatory Visit: Payer: Medicare Other | Admitting: Internal Medicine

## 2013-09-12 ENCOUNTER — Encounter: Payer: Self-pay | Admitting: Internal Medicine

## 2013-09-18 ENCOUNTER — Encounter: Payer: Self-pay | Admitting: Internal Medicine

## 2013-09-18 ENCOUNTER — Ambulatory Visit (INDEPENDENT_AMBULATORY_CARE_PROVIDER_SITE_OTHER): Payer: BC Managed Care – PPO | Admitting: Internal Medicine

## 2013-09-18 VITALS — BP 122/78 | HR 78 | Temp 98.2°F | Resp 18 | Ht 63.0 in | Wt 195.8 lb

## 2013-09-18 DIAGNOSIS — R609 Edema, unspecified: Secondary | ICD-10-CM | POA: Diagnosis not present

## 2013-09-18 DIAGNOSIS — M6530 Trigger finger, unspecified finger: Secondary | ICD-10-CM | POA: Diagnosis not present

## 2013-09-18 DIAGNOSIS — E785 Hyperlipidemia, unspecified: Secondary | ICD-10-CM | POA: Diagnosis not present

## 2013-09-18 MED ORDER — RED YEAST RICE EXTRACT 600 MG PO CAPS: 1.0000 | ORAL_CAPSULE | Freq: Two times a day (BID) | ORAL | Status: DC

## 2013-09-18 MED ORDER — PREDNISONE (PAK) 10 MG PO TABS: ORAL_TABLET | ORAL | Status: DC

## 2013-09-18 NOTE — Progress Notes (Signed)
Pre-visit discussion using our clinic review tool. No additional management support is needed unless otherwise documented below in the visit note.  

## 2013-09-18 NOTE — Progress Notes (Signed)
Patient ID: Barbara Wade, female   DOB: 1960/05/17, 53 y.o.   MRN: [id number]   Patient Active Problem List   Diagnosis Date Noted  . Trigger finger, acquired 09/19/2013  . Nausea alone 03/09/2013  . Seasonal affective disorder 12/20/2012  . Xiphoid prominence 11/01/2012  . Rosacea 05/02/2012  . Irritable bowel syndrome (IBS)   . Episcleritis of right eye 04/21/2012  . Obesity (BMI 30-39.9) 01/31/2012  . Vitamin D deficiency 01/05/2012  . Hand pain, right 12/15/2011  . Pain of left heel 12/15/2011  . Urinary frequency 10/30/2011  . Lymphedema of leg 07/17/2011  . Rhinitis, allergic 07/17/2011  . Diabetes mellitus type 2, diet-controlled   . Hyperlipidemia with target LDL less than 70 12/02/2010  . Hepatitis B virus infection   . Edema 10/18/2010  . History of colon polyps 10/16/2010  . Screening for breast cancer 10/16/2010  . Anaphylaxis   . Fibromyalgia     Subjective:  CC:   Chief Complaint  Patient presents with  . Acute Visit    Left index finger has become more painful.    HPI:   Barbara Wade is a 53 y.o. female who presents for  Multiple acute and chronic issues.    1) Left index finger  Has been swollen and painful since early June  With no response to NSAIDS thus far taken regularly.  It is now starting to act like a trigger finger .  She has a history of trigger finger on the middle finger of her right hand in the past.      2) Lower extremity edema aggravated by her recent trip to [place].  The swelling started while flying .  She has been using daily yoga and massage  3 times per week which has helped reduce her edema and improve her mobility  And joint pain  And is requesting  a letter of medical necessity   Past Medical History  Diagnosis Date  . Allergy   . Hypertension   . Sleep apnea   . Anaphylaxis 2007    occurred during allergy testing to environmental allergen  . Neuromuscular disorder   . Fibromyalgia 2004  .  Depression     managed currently with [facility]  . Hepatitis B virus infection   . Diabetes mellitus     new onset,  hgb1c 6.2  . Irritable bowel syndrome (IBS)     Past Surgical History  Procedure Laterality Date  . Knee arthroscopy    . Trigger finger release    . Cholecystectomy      elective  . Abdominal hysterectomy  2000    for fibroids       The following portions of the patient's history were reviewed and updated as appropriate: Allergies, current medications, and problem list.    Review of Systems:   Patient denies headache, fevers, malaise, unintentional weight loss, skin rash, eye pain, sinus congestion and sinus pain, sore throat, dysphagia,  hemoptysis , cough, dyspnea, wheezing, chest pain, palpitations, orthopnea, edema, abdominal pain, nausea, melena, diarrhea, constipation, flank pain, dysuria, hematuria, urinary  Frequency, nocturia, numbness, tingling, seizures,  Focal weakness, Loss of consciousness,  Tremor, insomnia, depression, anxiety, and suicidal ideation.     History   Social History  . Marital Status: Married    Spouse Name: Vania Rea     Number of Children: 2  . Years of Education: 16 yrs    Occupational History  . disabled  sec to meniere's 2009   Social History Main Topics  . Smoking status: Former Smoker    Quit date: 10/16/2002  . Smokeless tobacco: Never Used  . Alcohol Use: Yes     Comment: occasional  . Drug Use: No  . Sexual Activity: Yes   Other Topics Concern  . Not on file   Social History Narrative  . No narrative on file    Objective:  Filed Vitals:   09/18/13 1402  BP: 122/78  Pulse: 78  Temp: 98.2 F (36.8 C)  Resp: 18     General appearance: alert, cooperative and appears stated age Ears: normal TM's and external ear canals both ears Throat: lips, mucosa, and tongue normal; teeth and gums normal Neck: no adenopathy, no carotid bruit, supple, symmetrical, trachea midline and thyroid not enlarged,  symmetric, no tenderness/mass/nodules Back: symmetric, no curvature. ROM normal. No CVA tenderness. Lungs: clear to auscultation bilaterally Heart: regular rate and rhythm, S1, S2 normal, no murmur, click, rub or gallop Abdomen: soft, non-tender; bowel sounds normal; no masses,  no organomegaly Pulses: 2+ and symmetric Skin: Skin color, texture, turgor normal. No rashes or lesions Lymph nodes: Cervical, supraclavicular, and axillary nodes normal.  Assessment and Plan:  Edema Improving with use of massage and yoga.  Continue both    Trigger finger, acquired Trial of 6 day prednisone taper to manage inflammation.  If no change, orthopedics referral   Hyperlipidemia with target LDL less than 70 Recommended trial of red yeast rice 600 mg bid   Lab Results  Component Value Date   ALT 31 09/07/2013   AST 28 09/07/2013   ALKPHOS 98 09/07/2013   BILITOT 0.8 09/07/2013   Lab Results  Component Value Date   CHOL 231* 09/07/2013   HDL 47.30 09/07/2013   LDLCALC 156* 09/07/2013   LDLDIRECT 129.9 12/20/2012   TRIG 140.0 09/07/2013   CHOLHDL 5 09/07/2013      Updated Medication List Outpatient Encounter Prescriptions as of 09/18/2013  Medication Sig  . Cholecalciferol (VITAMIN D3) 1000 UNITS CAPS Take 2 capsules by mouth daily.   Marland Kitchen EPINEPHrine (EPIPEN) 0.3 mg/0.3 mL DEVI Inject 0.3 mLs (0.3 mg total) into the muscle once.  . hyoscyamine (ANASPAZ) 0.125 MG TBDP Place 0.125 mg under the tongue as needed.    . Lactulose 20 GM/30ML SOLN Take 15 mLs (10 g total) by mouth every 6 (six) hours as needed (for relief of constipation).  . meclizine (ANTIVERT) 25 MG tablet Take 25 mg by mouth 2 (two) times daily as needed.    . montelukast (SINGULAIR) 10 MG tablet TAKE 1 TABLET AT BEDTIME  . phentermine (ADIPEX-P) 37.5 MG tablet TAKE ONE-HALF TABLET BY MOUTH TWICE DAILY   . triamterene-hydrochlorothiazide (MAXZIDE-25) 37.5-25 MG per tablet TAKE ONE TABLET BY MOUTH ONE TIME DAILY  . fexofenadine  (ALLEGRA) 180 MG tablet Take 1 tablet (180 mg total) by mouth daily.  . fluticasone (FLONASE) 50 MCG/ACT nasal spray Place 2 sprays into the nose daily.  . Red Yeast Rice Extract ([employer] RED YEAST RICE) 600 MG CAPS Take 1 capsule (600 mg total) by mouth 2 (two) times daily after a meal.  . [DISCONTINUED] BLACK COHOSH PO Take 1,600 mg by mouth daily.  . [DISCONTINUED] levofloxacin (LEVAQUIN) 500 MG tablet Take 1 tablet (500 mg total) by mouth daily.  . [DISCONTINUED] olopatadine (PATANOL) 0.1 % ophthalmic solution Place 1 drop into both eyes 2 (two) times daily.  . [DISCONTINUED] predniSONE (STERAPRED UNI-PAK) 10 MG tablet 6 tablets  on Day 1 , then reduce by 1 tablet daily until gone  . [DISCONTINUED] promethazine (PHENERGAN) 25 MG tablet Take 1 tablet (25 mg total) by mouth every 8 (eight) hours as needed for nausea or vomiting.  . [DISCONTINUED] SAVELLA 100 MG TABS tablet TAKE 1 TABLET TWICE A DAY     No orders of the defined types were placed in this encounter.    No Follow-up on file.

## 2013-09-18 NOTE — Patient Instructions (Signed)
1) ) 6 Day prednisone taper for the trigger finger.  If no improvement,  Ortho referral  2) Red yeast rice 600 mg capsule twice daily to lowere your LDL cholesterl  3)Unsweetened almond/coconut milk is a great low calorie,  low carb, cholesterol free  way to increase your  HDL,  Your dietary calcium and vitamin D.  Try the blue Jackquline Bosch

## 2013-09-19 ENCOUNTER — Encounter: Payer: Self-pay | Admitting: Internal Medicine

## 2013-09-19 DIAGNOSIS — M6530 Trigger finger, unspecified finger: Secondary | ICD-10-CM | POA: Insufficient documentation

## 2013-09-19 NOTE — Assessment & Plan Note (Signed)
Improving with use of massage and yoga.  Continue both

## 2013-09-19 NOTE — Assessment & Plan Note (Signed)
Recommended trial of red yeast rice 600 mg bid   Lab Results  Component Value Date   ALT 31 09/07/2013   AST 28 09/07/2013   ALKPHOS 98 09/07/2013   BILITOT 0.8 09/07/2013   Lab Results  Component Value Date   CHOL 231* 09/07/2013   HDL 47.30 09/07/2013   LDLCALC 156* 09/07/2013   LDLDIRECT 129.9 12/20/2012   TRIG 140.0 09/07/2013   CHOLHDL 5 09/07/2013

## 2013-09-19 NOTE — Assessment & Plan Note (Addendum)
Trial of 6 day prednisone taper to manage inflammation.  If no change, orthopedics referral

## 2013-10-05 ENCOUNTER — Other Ambulatory Visit: Payer: Self-pay | Admitting: Internal Medicine

## 2013-10-12 LAB — TSH: TSH: 3.02 u[IU]/mL (ref 0.41–5.90)

## 2013-10-12 LAB — BASIC METABOLIC PANEL
BUN: 21 mg/dL (ref 4–21)
Creatinine: 0.8 mg/dL (ref 0.5–1.1)
Glucose: 120 mg/dL
Potassium: 4.6 mmol/L (ref 3.4–5.3)
Sodium: 144 mmol/L (ref 137–147)

## 2013-10-12 LAB — HEPATIC FUNCTION PANEL
ALK PHOS: 101 U/L (ref 25–125)
ALT: 30 U/L (ref 7–35)
AST: 23 U/L (ref 13–35)
Bilirubin, Direct: 0.11 mg/dL (ref 0.01–0.4)
Bilirubin, Total: 0.4 mg/dL

## 2013-10-12 LAB — LIPID PANEL
Cholesterol: 212 mg/dL — AB (ref 0–200)
HDL: 53 mg/dL (ref 35–70)
LDL CALC: 125 mg/dL
Triglycerides: 171 mg/dL — AB (ref 40–160)

## 2013-10-17 ENCOUNTER — Encounter: Payer: Self-pay | Admitting: Internal Medicine

## 2013-10-23 ENCOUNTER — Telehealth: Payer: Self-pay | Admitting: Internal Medicine

## 2013-10-23 NOTE — Telephone Encounter (Signed)
Labs received. Patient notified via e mail.  Please abstract. thanks

## 2013-10-24 NOTE — Telephone Encounter (Signed)
Labs abstracted 

## 2013-10-29 ENCOUNTER — Telehealth: Payer: Self-pay | Admitting: Internal Medicine

## 2013-10-29 NOTE — Telephone Encounter (Signed)
Left message for pt to return my call.

## 2013-10-29 NOTE — Telephone Encounter (Signed)
yes

## 2013-10-29 NOTE — Telephone Encounter (Signed)
Received this message form pt "Something is going on with my stomach and/or esophagus, up to my throat. Intermittently, it hurts just below the sternum up to the jugular notch, or that whole strip between my breasts (esophagus?) up to the notch feels tight, like the tube is smaller, or filled with air. Sometimes I feel really full, other times I feel starved even after I just ate. So I would love to make an appointment with Dr. Derrel Nip." please advise where to add pt to schedule/msn

## 2013-10-29 NOTE — Telephone Encounter (Signed)
We have a 30 minute spot 10/31/13 is this ok ?

## 2013-11-05 ENCOUNTER — Encounter: Payer: Self-pay | Admitting: Internal Medicine

## 2013-11-05 ENCOUNTER — Ambulatory Visit (INDEPENDENT_AMBULATORY_CARE_PROVIDER_SITE_OTHER): Payer: BC Managed Care – PPO | Admitting: Internal Medicine

## 2013-11-05 VITALS — BP 108/72 | HR 86 | Temp 98.1°F | Resp 16 | Ht 63.0 in | Wt 197.8 lb

## 2013-11-05 DIAGNOSIS — E669 Obesity, unspecified: Secondary | ICD-10-CM | POA: Diagnosis not present

## 2013-11-05 DIAGNOSIS — M6530 Trigger finger, unspecified finger: Secondary | ICD-10-CM | POA: Diagnosis not present

## 2013-11-05 DIAGNOSIS — R1013 Epigastric pain: Secondary | ICD-10-CM

## 2013-11-05 MED ORDER — PANTOPRAZOLE SODIUM 40 MG PO TBEC: 40.0000 mg | DELAYED_RELEASE_TABLET | Freq: Every day | ORAL | Status: DC

## 2013-11-05 NOTE — Telephone Encounter (Signed)
Pt has appt scheduled today

## 2013-11-05 NOTE — Patient Instructions (Signed)
I suspect you may have a hiatal hernia and GERD   Please start taking protonix in the AM about 30 minute prior to eating.  Small frequent meals will help reduce your symptoms and help you lose weight  Since you have resumed phentermine you need to use it daily and combine with diet and exercise and return in  3 months.   I am ordering a noninvasive test called an upper GI study to confirm  Referral to Ortho for your finger   Hiatal Hernia A hiatal hernia occurs when part of your stomach slides above the muscle that separates your abdomen from your chest (diaphragm). You can be born with a hiatal hernia (congenital), or it may develop over time. In almost all cases of hiatal hernia, only the top part of the stomach pushes through.  Many people have a hiatal hernia with no symptoms. The larger the hernia, the more likely that you will have symptoms. In some cases, a hiatal hernia allows stomach acid to flow back into the tube that carries food from your mouth to your stomach (esophagus). This may cause heartburn symptoms. Severe heartburn symptoms may mean you have developed a condition called gastroesophageal reflux disease (GERD).  CAUSES  Hiatal hernias are caused by a weakness in the opening (hiatus) where your esophagus passes through your diaphragm to attach to the upper part of your stomach. You may be born with a weakness in your hiatus, or a weakness can develop. RISK FACTORS Older age is a major risk factor for a hiatal hernia. Anything that increases pressure on your diaphragm can also increase your risk of a hiatal hernia. This includes:  Pregnancy.  Excess weight.  Frequent constipation. SIGNS AND SYMPTOMS  People with a hiatal hernia often have no symptoms. If symptoms develop, they are almost always caused by GERD. They may include:  Heartburn.  Belching.  Indigestion.  Trouble swallowing.  Coughing or wheezing.  Sore throat.  Hoarseness.  Chest pain. DIAGNOSIS    A hiatal hernia is sometimes found during an exam for another problem. Your health care provider may suspect a hiatal hernia if you have symptoms of GERD. Tests may be done to diagnose GERD. These may include:  X-rays of your stomach or chest.  An upper gastrointestinal (GI) series. This is an X-ray exam of your GI tract involving the use of a chalky liquid that you swallow. The liquid shows up clearly on the X-ray.  Endoscopy. This is a procedure to look into your stomach using a thin, flexible tube that has a tiny camera and light on the end of it. TREATMENT  If you have no symptoms, you may not need treatment. If you have symptoms, treatment may include:  Dietary and lifestyle changes to help reduce GERD symptoms.  Medicines. These may include:  Over-the-counter antacids.  Medicines that make your stomach empty more quickly.  Medicines that block the production of stomach acid (H2 blockers).  Stronger medicines to reduce stomach acid (proton pump inhibitors).  You may need surgery to repair the hernia if other treatments are not helping. HOME CARE INSTRUCTIONS   Take all medicines as directed by your health care provider.  Quit smoking, if you smoke.  Try to achieve and maintain a healthy body weight.  Eat frequent small meals instead of three large meals a day. This keeps your stomach from getting too full.  Eat slowly.  Do not lie down right after eating.  Do noteat 1-2 hours before bed.  Do not drink beverages with caffeine. These include cola, coffee, cocoa, and tea.  Do not drink alcohol.  Avoid foods that can make symptoms of GERD worse. These may include:  Fatty foods.  Citrus fruits.  Other foods and drinks that contain acid.  Avoid putting pressure on your belly. Anything that puts pressure on your belly increases the amount of acid that may be pushed up into your esophagus.   Avoid bending over, especially after eating.  Raise the head of  your bed by putting blocks under the legs. This keeps your head and esophagus higher than your stomach.  Do not wear tight clothing around your chest or stomach.  Try not to strain when having a bowel movement, when urinating, or when lifting heavy objects. SEEK MEDICAL CARE IF:  Your symptoms are not controlled with medicines or lifestyle changes.  You are having trouble swallowing.  You have coughing or wheezing that will not go away. SEEK IMMEDIATE MEDICAL CARE IF:  Your pain is getting worse.  Your pain spreads to your arms, neck, jaw, teeth, or back.  You have shortness of breath.  You sweat for no reason.  You feel sick to your stomach (nauseous) or vomit.  You vomit blood.  You have bright red blood in your stools.  You have black, tarry stools.  Document Released: 04/03/2003 Document Revised: 05/28/2013 Document Reviewed: 12/29/2012 [facility] Patient Information 2015 [place], [place]. This information is not intended to replace advice given to you by your health care provider. Make sure you discuss any questions you have with your health care provider.

## 2013-11-05 NOTE — Progress Notes (Signed)
Patient ID: Barbara Wade, female   DOB: 1960/03/05, 53 y.o.   MRN: [id number]  Patient Active Problem List   Diagnosis Date Noted  . Trigger finger, acquired 09/19/2013  . Nausea alone 03/09/2013  . Seasonal affective disorder 12/20/2012  . Postprandial epigastric pain 11/01/2012  . Rosacea 05/02/2012  . Irritable bowel syndrome (IBS)   . Episcleritis of right eye 04/21/2012  . Obesity (BMI 30-39.9) 01/31/2012  . Vitamin D deficiency 01/05/2012  . Hand pain, right 12/15/2011  . Pain of left heel 12/15/2011  . Urinary frequency 10/30/2011  . Lymphedema of leg 07/17/2011  . Rhinitis, allergic 07/17/2011  . Diabetes mellitus type 2, diet-controlled   . Hyperlipidemia with target LDL less than 70 12/02/2010  . Hepatitis B virus infection   . Edema 10/18/2010  . History of colon polyps 10/16/2010  . Screening for breast cancer 10/16/2010  . Anaphylaxis   . Fibromyalgia     Subjective:  CC:   Chief Complaint  Patient presents with  . Follow-up    Hypergastric pain    HPI:   Barbara Wade is a 53 y.o. female who presents for  1) Recurrent epigastric pain.  Patient points to xiphoid process and reports moderate pain and tenderness to palpation that fells better if she inserts her cupped fingers under the bone.  The pain is  made worse by eating large meals and is accompanied by post prandial gas and dyspnea.  Symptoms are also aggravated by bending over .  Prior abdominal ultrasound was normal and symptoms resolved so no further workup was done. She has gained weight since then.   2) Obesity>  Patient has been trying to lose weight with diet and exercise and has been  prescribed phentermine, but stopped it for several months, while she was travelling to various vacation destinations and resumed it October 1  When she noted weight gain and has lost 3 lbs thus far     3) Index finger on left hand still hurting and swollen since she injured it in a fall while  [place] in  [place].  She was treated by me with a prednisone taper and symptoms of trigger finger improved but the finger still has decreased flexion due to swelling and stiffness.  She is requesting orthopedic evaluation.    Past Medical History  Diagnosis Date  . Allergy   . Hypertension   . Sleep apnea   . Anaphylaxis 2007    occurred during allergy testing to environmental allergen  . Neuromuscular disorder   . Fibromyalgia 2004  . Depression     managed currently with [facility]  . Hepatitis B virus infection   . Diabetes mellitus     new onset,  hgb1c 6.2  . Irritable bowel syndrome (IBS)     Past Surgical History  Procedure Laterality Date  . Knee arthroscopy    . Trigger finger release    . Cholecystectomy      elective  . Abdominal hysterectomy  2000    for fibroids       The following portions of the patient's history were reviewed and updated as appropriate: Allergies, current medications, and problem list.    Review of Systems:   Patient denies headache, fevers, malaise, unintentional weight loss, skin rash, eye pain, sinus congestion and sinus pain, sore throat, dysphagia,  hemoptysis , cough, dyspnea, wheezing, chest pain, palpitations, orthopnea, edema, abdominal pain, nausea, melena, diarrhea, constipation, flank pain, dysuria, hematuria, urinary  Frequency, nocturia, numbness, tingling, seizures,  Focal weakness, Loss of consciousness,  Tremor, insomnia, depression, anxiety, and suicidal ideation.     History   Social History  . Marital Status: Married    Spouse Name: Vania Rea     Number of Children: 2  . Years of Education: 16 yrs    Occupational History  . disabled     sec to meniere's 2009   Social History Main Topics  . Smoking status: Former Smoker    Quit date: 10/16/2002  . Smokeless tobacco: Never Used  . Alcohol Use: Yes     Comment: occasional  . Drug Use: No  . Sexual Activity: Yes   Other Topics Concern  . Not on file    Social History Narrative  . No narrative on file    Objective:  Filed Vitals:   11/05/13 1520  BP: 108/72  Pulse: 86  Temp: 98.1 F (36.7 C)  Resp: 16     General appearance: alert, cooperative and appears stated age Ears: normal TM's and external ear canals both ears Throat: lips, mucosa, and tongue normal; teeth and gums normal Neck: no adenopathy, no carotid bruit, supple, symmetrical, trachea midline and thyroid not enlarged, symmetric, no tenderness/mass/nodules Back: symmetric, no curvature. ROM normal. No CVA tenderness. Lungs: clear to auscultation bilaterally Heart: regular rate and rhythm, S1, S2 normal, no murmur, click, rub or gallop Abdomen: soft, non-tender; bowel sounds normal; no masses,  no organomegaly Pulses: 2+ and symmetric Skin: Skin color, texture, turgor normal. No rashes or lesions Lymph nodes: Cervical, supraclavicular, and axillary nodes normal.  Assessment and Plan:  Postprandial epigastric pain History now suggests hiatal hernia,  Upper GI/SBFT ordered.  PPI resumed, and advised to eat smaller meals.     Obesity (BMI 30-39.9) She has had difficulty losing weight due to increased appetite and is requesting a trial of  Phentermine.  She is aware of the possible side effects and risks and understands that    The medication will be discontinued if she has not lost 5% of her body weight over the next 3 months, which , based on today's weight is 10 lbs.  Goal wt for BMI < 30 is 168 lbs,  140 lbs for BMI < 25  A total of 25 minutes of face to face time was spent with patient more than half of which was spent in counselling and coordination of care    Updated Medication List Outpatient Encounter Prescriptions as of 11/05/2013  Medication Sig  . Cholecalciferol (VITAMIN D3) 1000 UNITS CAPS Take 2 capsules by mouth daily.   Marland Kitchen EPINEPHrine (EPIPEN) 0.3 mg/0.3 mL DEVI Inject 0.3 mLs (0.3 mg total) into the muscle once.  . fluticasone (FLONASE) 50  MCG/ACT nasal spray Place 2 sprays into the nose daily.  . hyoscyamine (ANASPAZ) 0.125 MG TBDP Place 0.125 mg under the tongue as needed.    . Lactulose 20 GM/30ML SOLN Take 15 mLs (10 g total) by mouth every 6 (six) hours as needed (for relief of constipation).  . meclizine (ANTIVERT) 25 MG tablet Take 25 mg by mouth 2 (two) times daily as needed.    . montelukast (SINGULAIR) 10 MG tablet TAKE 1 TABLET AT BEDTIME  . phentermine (ADIPEX-P) 37.5 MG tablet TAKE ONE-HALF TABLET BY MOUTH TWICE DAILY   . Red Yeast Rice Extract ([employer] RED YEAST RICE) 600 MG CAPS Take 1 capsule (600 mg total) by mouth 2 (two) times daily after a meal.  . triamterene-hydrochlorothiazide (MAXZIDE-25) 37.5-25 MG  per tablet TAKE ONE TABLET BY MOUTH ONE TIME DAILY   . fexofenadine (ALLEGRA) 180 MG tablet Take 1 tablet (180 mg total) by mouth daily.  . pantoprazole (PROTONIX) 40 MG tablet Take 1 tablet (40 mg total) by mouth daily.     Orders Placed This Encounter  Procedures  . DG UGI W/Small Bowel  . Ambulatory referral to Orthopedic Surgery    Return in about 3 months (around 02/05/2014).

## 2013-11-05 NOTE — Progress Notes (Signed)
Pre-visit discussion using our clinic review tool. No additional management support is needed unless otherwise documented below in the visit note.  

## 2013-11-06 NOTE — Assessment & Plan Note (Addendum)
She has had difficulty losing weight due to increased appetite and is requesting a trial of  Phentermine.  She is aware of the possible side effects and risks and understands that    The medication will be discontinued if she has not lost 5% of her body weight over the next 3 months, which , based on today's weight is 10 lbs.  Goal wt for BMI < 30 is 168 lbs,  140 lbs for BMI < 25

## 2013-11-06 NOTE — Assessment & Plan Note (Signed)
Trial of 6 day prednisone taper to manage inflammation resolved the trigger finer but she has persistent swelling and stiffness.  Refer to [facility] orthopedics

## 2013-11-06 NOTE — Assessment & Plan Note (Signed)
History now suggests hiatal hernia,  Upper GI/SBFT ordered.  PPI resumed, and advised to eat smaller meals.

## 2013-11-09 ENCOUNTER — Ambulatory Visit: Payer: Self-pay | Admitting: Internal Medicine

## 2013-11-09 DIAGNOSIS — K449 Diaphragmatic hernia without obstruction or gangrene: Secondary | ICD-10-CM | POA: Diagnosis not present

## 2013-11-09 DIAGNOSIS — K219 Gastro-esophageal reflux disease without esophagitis: Secondary | ICD-10-CM | POA: Diagnosis not present

## 2013-11-12 ENCOUNTER — Telehealth: Payer: Self-pay | Admitting: Internal Medicine

## 2013-11-12 DIAGNOSIS — K219 Gastro-esophageal reflux disease without esophagitis: Secondary | ICD-10-CM | POA: Insufficient documentation

## 2013-11-12 DIAGNOSIS — K449 Diaphragmatic hernia without obstruction or gangrene: Secondary | ICD-10-CM | POA: Insufficient documentation

## 2013-11-15 DIAGNOSIS — M65322 Trigger finger, left index finger: Secondary | ICD-10-CM | POA: Diagnosis not present

## 2013-12-07 ENCOUNTER — Encounter: Payer: Self-pay | Admitting: Internal Medicine

## 2013-12-18 ENCOUNTER — Other Ambulatory Visit: Payer: Self-pay | Admitting: *Deleted

## 2013-12-18 MED ORDER — PANTOPRAZOLE SODIUM 40 MG PO TBEC: 40.0000 mg | DELAYED_RELEASE_TABLET | Freq: Every day | ORAL | Status: DC

## 2014-01-03 ENCOUNTER — Telehealth: Payer: Self-pay | Admitting: Internal Medicine

## 2014-01-03 MED ORDER — PHENTERMINE HCL 37.5 MG PO TABS: 18.7500 mg | ORAL_TABLET | Freq: Two times a day (BID) | ORAL | Status: DC

## 2014-01-03 NOTE — Telephone Encounter (Signed)
Refill one 30 days only.

## 2014-01-04 ENCOUNTER — Other Ambulatory Visit: Payer: Self-pay | Admitting: Internal Medicine

## 2014-01-04 NOTE — Telephone Encounter (Signed)
Script faxed.

## 2014-01-04 NOTE — Telephone Encounter (Signed)
Ok to refill,  printed rx  

## 2014-01-16 ENCOUNTER — Encounter: Payer: Self-pay | Admitting: Internal Medicine

## 2014-01-16 DIAGNOSIS — K589 Irritable bowel syndrome without diarrhea: Secondary | ICD-10-CM

## 2014-02-05 ENCOUNTER — Ambulatory Visit: Payer: BC Managed Care – PPO | Admitting: Internal Medicine

## 2014-03-07 ENCOUNTER — Encounter: Payer: Self-pay | Admitting: Internal Medicine

## 2014-03-08 ENCOUNTER — Encounter: Payer: Self-pay | Admitting: Internal Medicine

## 2014-03-08 ENCOUNTER — Ambulatory Visit (INDEPENDENT_AMBULATORY_CARE_PROVIDER_SITE_OTHER): Payer: BLUE CROSS/BLUE SHIELD | Admitting: Internal Medicine

## 2014-03-08 VITALS — BP 112/76 | HR 81 | Temp 98.3°F | Resp 14 | Ht 63.0 in | Wt 197.5 lb

## 2014-03-08 DIAGNOSIS — K449 Diaphragmatic hernia without obstruction or gangrene: Secondary | ICD-10-CM | POA: Diagnosis not present

## 2014-03-08 DIAGNOSIS — K2100 Gastro-esophageal reflux disease with esophagitis, without bleeding: Secondary | ICD-10-CM

## 2014-03-08 DIAGNOSIS — E669 Obesity, unspecified: Secondary | ICD-10-CM

## 2014-03-08 MED ORDER — DICYCLOMINE HCL 10 MG PO CAPS: 10.0000 mg | ORAL_CAPSULE | Freq: Three times a day (TID) | ORAL | Status: DC

## 2014-03-08 NOTE — Patient Instructions (Addendum)
Continue Protonix daily in the  Am.  I am adding a trial of generic bentyl up to 4 times daily before meals  Can increase to 20 mg after 2 days if no difference in "squeezing" pain,  This is an antispasmodic for the stomach     Barrett's Esophagus Barrett's esophagus occurs when the lining of the esophagus is damaged. The esophagus is the tube that carries food from the mouth to the stomach. With Barrett's esophagus, the lining of the esophagus gets replaced by material that is similar to the lining in the intestines. This process is called intestinal metaplasia. A small number of people with Barrett's esophagus develop esophageal cancer. CAUSES  The exact cause of Barrett's esophagus is unknown. SYMPTOMS  Most people with Barrett's esophagus do not have symptoms. However, many patients also have gastroesophageal reflux disease (GERD). GERD can cause heartburn, trouble swallowing, and a dry cough. DIAGNOSIS Barrett's esophagus is diagnosed by an exam called upper gastrointestinal endoscopy. A thin, flexible tube (endoscope) is passed down the esophagus. The endoscope has a light and camera on the end. Your caregiver uses the endoscope to view the inside of the esophagus. A tissue sample may also be taken and examined under a microscope (biopsy). If cancer cells are found during the biopsy, this condition is called dysplasia. TREATMENT  If you have no dysplasia or low-grade dysplasia, your caregiver may recommend no treatment or only taking medicines to treat GERD. Sometimes, taking acid-blocking drugs to treat GERD helps improve the tissue affected by Barrett's esophagus. Your caregiver may also recommend periodic esophageal exams. If you have high-grade dysplasia, treatment may include removing the damaged parts of the esophagus. This can be done by heating, freezing, or surgically removing the tissue. In some cases, surgery may be done to remove most of the esophagus. The stomach is then attached to  the remaining portion of the esophagus. HOME CARE INSTRUCTIONS  Take acid-blocking drugs for GERD if recommended by your caregiver.  Keep all follow-up appointments as directed by your caregiver. You may need periodic esophageal exams. SEEK IMMEDIATE MEDICAL CARE IF:  You have chest pain.  You have trouble swallowing.  You vomit blood or material that looks like coffee grounds.  Your stools are bright red or dark. Document Released: 04/03/2003 Document Revised: 07/13/2011 Document Reviewed: 03/23/2011 [facility] Patient Information 2015 [place], [place]. This information is not intended to replace advice given to you by your health care provider. Make sure you discuss any questions you have with your health care provider.

## 2014-03-08 NOTE — Progress Notes (Signed)
Pre-visit discussion using our clinic review tool. No additional management support is needed unless otherwise documented below in the visit note.  

## 2014-03-08 NOTE — Progress Notes (Signed)
Patient ID: Barbara Wade, female   DOB: 1960/12/29, 54 y.o.   MRN: [id number]  Patient Active Problem List   Diagnosis Date Noted  . Esophageal reflux 11/12/2013  . Sliding hiatal hernia 11/12/2013  . Trigger finger, acquired 09/19/2013  . Nausea alone 03/09/2013  . Seasonal affective disorder 12/20/2012  . Postprandial epigastric pain 11/01/2012  . Rosacea 05/02/2012  . Irritable bowel syndrome (IBS)   . Episcleritis of right eye 04/21/2012  . Obesity (BMI 30-39.9) 01/31/2012  . Vitamin D deficiency 01/05/2012  . Hand pain, right 12/15/2011  . Pain of left heel 12/15/2011  . Urinary frequency 10/30/2011  . Lymphedema of leg 07/17/2011  . Rhinitis, allergic 07/17/2011  . Diabetes mellitus type 2, diet-controlled   . Hyperlipidemia with target LDL less than 70 12/02/2010  . Hepatitis B virus infection   . Edema 10/18/2010  . History of colon polyps 10/16/2010  . Screening for breast cancer 10/16/2010  . Anaphylaxis   . Fibromyalgia     Subjective:  CC:   Chief Complaint  Patient presents with  . Follow-up    hernia    HPI:   Barbara Wade is a 54 y.o. female who presents for   Follow up on recurrent abdominal pain with bloating.  She was referred for an upper GI /SBFT that was normal except for a smal sliding hiatal hernia .  She is having recurrent symptoms of a squeezing cramping sensation underneath her xiphoid process that occurs both at rest and with exercise and aggravated by eating.  She states that she was told she had Barrett's Esophagus years ago on  endoocopy but was not aware that she should be taking a PPI,   Past Medical History  Diagnosis Date  . Allergy   . Hypertension   . Sleep apnea   . Anaphylaxis 2007    occurred during allergy testing to environmental allergen  . Neuromuscular disorder   . Fibromyalgia 2004  . Depression     managed currently with [facility]  . Hepatitis B virus infection   . Diabetes mellitus    new onset,  hgb1c 6.2  . Irritable bowel syndrome (IBS)     Past Surgical History  Procedure Laterality Date  . Knee arthroscopy    . Trigger finger release    . Cholecystectomy      elective  . Abdominal hysterectomy  2000    for fibroids       The following portions of the patient's history were reviewed and updated as appropriate: Allergies, current medications, and problem list.    Review of Systems:   Patient denies headache, fevers, malaise, unintentional weight loss, skin rash, eye pain, sinus congestion and sinus pain, sore throat, dysphagia,  hemoptysis , cough, dyspnea, wheezing, chest pain, palpitations, orthopnea, edema, abdominal pain, nausea, melena, diarrhea, constipation, flank pain, dysuria, hematuria, urinary  Frequency, nocturia, numbness, tingling, seizures,  Focal weakness, Loss of consciousness,  Tremor, insomnia, depression, anxiety, and suicidal ideation.     History   Social History  . Marital Status: Married    Spouse Name: Vania Rea   . Number of Children: 2  . Years of Education: 16 yrs    Occupational History  . disabled     sec to meniere's 2009   Social History Main Topics  . Smoking status: Former Smoker    Quit date: 10/16/2002  . Smokeless tobacco: Never Used  . Alcohol Use: Yes     Comment: occasional  .  Drug Use: No  . Sexual Activity: Yes   Other Topics Concern  . Not on file   Social History Narrative    Objective:  Filed Vitals:   03/08/14 1437  BP: 112/76  Pulse: 81  Temp: 98.3 F (36.8 C)  Resp: 14     General appearance: alert, cooperative and appears stated age Ears: normal TM's and external ear canals both ears Throat: lips, mucosa, and tongue normal; teeth and gums normal Neck: no adenopathy, no carotid bruit, supple, symmetrical, trachea midline and thyroid not enlarged, symmetric, no tenderness/mass/nodules Back: symmetric, no curvature. ROM normal. No CVA tenderness. Lungs: clear to auscultation  bilaterally Heart: regular rate and rhythm, S1, S2 normal, no murmur, click, rub or gallop Abdomen: soft, non-tender; bowel sounds normal; no masses,  no organomegaly Pulses: 2+ and symmetric Skin: Skin color, texture, turgor normal. No rashes or lesions Lymph nodes: Cervical, supraclavicular, and axillary nodes normal.  Assessment and Plan:  Sliding hiatal hernia Patient advised to eat smaller more frequent meals to avoid over distension    Esophageal reflux Apparently with a history of Barrett's esophagus diagnosed at prior EGD over 3 years ago.   Patient advised to resume PPI daily  Records requested.  GI referral in process for EGD.  Addiing a trial of 10 mg dicyclomine  Up tp 4 times daily for spasm she is describing.    Obesity (BMI 30-39.9) She had difficulty losing weight due to increased appetite and requested a trial of  Phentermine in October.  She has not lost  lost 5% of her body weight over the last 3 months, or 8 lbs.  Will suspend the medication for now .   Goal wt for BMI < 30 is 168 lbs,  140 lbs for BMI < 25   Wt Readings from Last 3 Encounters:  03/08/14 197 lb 8 oz (89.585 kg)  11/05/13 197 lb 12 oz (89.699 kg)  09/18/13 195 lb 12 oz (88.792 kg)     A total of 40 minutes of face to face time was spent with patient more than half of which was spent in counselling and coordination of care   Updated Medication List Outpatient Encounter Prescriptions as of 03/08/2014  Medication Sig  . Cholecalciferol (VITAMIN D3) 1000 UNITS CAPS Take 2 capsules by mouth daily.   Marland Kitchen EPINEPHrine (EPIPEN) 0.3 mg/0.3 mL DEVI Inject 0.3 mLs (0.3 mg total) into the muscle once.  . fluticasone (FLONASE) 50 MCG/ACT nasal spray Place 2 sprays into the nose daily.  . hyoscyamine (ANASPAZ) 0.125 MG TBDP Place 0.125 mg under the tongue as needed.    . Lactulose 20 GM/30ML SOLN Take 15 mLs (10 g total) by mouth every 6 (six) hours as needed (for relief of constipation).  . meclizine  (ANTIVERT) 25 MG tablet Take 25 mg by mouth 2 (two) times daily as needed.    . montelukast (SINGULAIR) 10 MG tablet TAKE 1 TABLET AT BEDTIME  . pantoprazole (PROTONIX) 40 MG tablet Take 1 tablet (40 mg total) by mouth daily.  . Red Yeast Rice Extract ([employer]S RED YEAST RICE) 600 MG CAPS Take 1 capsule (600 mg total) by mouth 2 (two) times daily after a meal.  . triamterene-hydrochlorothiazide (MAXZIDE-25) 37.5-25 MG per tablet TAKE ONE TABLET BY MOUTH ONE TIME DAILY   . [DISCONTINUED] phentermine (ADIPEX-P) 37.5 MG tablet Take 0.5 tablets (18.75 mg total) by mouth 2 (two) times daily.  Marland Kitchen dicyclomine (BENTYL) 10 MG capsule Take 1 capsule (10 mg total) by  mouth 4 (four) times daily -  before meals and at bedtime.  . fexofenadine (ALLEGRA) 180 MG tablet Take 1 tablet (180 mg total) by mouth daily.     No orders of the defined types were placed in this encounter.    No Follow-up on file.

## 2014-03-10 ENCOUNTER — Encounter: Payer: Self-pay | Admitting: Internal Medicine

## 2014-03-10 NOTE — Assessment & Plan Note (Addendum)
Apparently with a history of Barrett's esophagus diagnosed at prior EGD over 3 years ago.   Patient advised to resume PPI daily  Records requested.  GI referral in process for EGD.  Addiing a trial of 10 mg dicyclomine  Up tp 4 times daily for spasm she is describing.

## 2014-03-10 NOTE — Assessment & Plan Note (Addendum)
She had difficulty losing weight due to increased appetite and requested a trial of  Phentermine in October.  She has not lost  lost 5% of her body weight over the last 3 months, or 8 lbs.  Will suspend the medication for now .   Goal wt for BMI < 30 is 168 lbs,  140 lbs for BMI < 25   Wt Readings from Last 3 Encounters:  03/08/14 197 lb 8 oz (89.585 kg)  11/05/13 197 lb 12 oz (89.699 kg)  09/18/13 195 lb 12 oz (88.792 kg)

## 2014-03-10 NOTE — Assessment & Plan Note (Signed)
Patient advised to eat smaller more frequent meals to avoid over distension

## 2014-03-20 ENCOUNTER — Encounter: Payer: Self-pay | Admitting: Internal Medicine

## 2014-03-22 ENCOUNTER — Other Ambulatory Visit: Payer: Self-pay | Admitting: Internal Medicine

## 2014-03-25 NOTE — Telephone Encounter (Signed)
Last OV 2.12.16, please advise refill

## 2014-03-25 NOTE — Telephone Encounter (Signed)
The medication was suspended at last visit because she wa snot following a diet or exercising regularly. She will need to start a diet and exercise plan first and then follow up with me in a month before I will consider refilling it

## 2014-03-29 NOTE — Telephone Encounter (Signed)
Spoke with pt, she is aware of MDs message

## 2014-04-08 ENCOUNTER — Encounter: Payer: Self-pay | Admitting: Internal Medicine

## 2014-04-08 MED ORDER — BENEFIBER PO POWD: ORAL | Status: DC

## 2014-04-08 MED ORDER — FEXOFENADINE HCL 180 MG PO TABS: 180.0000 mg | ORAL_TABLET | Freq: Every day | ORAL | Status: DC

## 2014-04-08 NOTE — Telephone Encounter (Signed)
Original msg asking for refill on meclizine and phentermine. Benefiber sent

## 2014-04-08 NOTE — Telephone Encounter (Signed)
SEE MY CHART MESSAGE. 

## 2014-04-08 NOTE — Telephone Encounter (Signed)
PLEASE CALL IN THE BENEFIBER REFILL FOR PATIENT

## 2014-04-08 NOTE — Addendum Note (Signed)
Addended by: Wynonia Lawman E on: 04/08/2014 02:48 PM   Modules accepted: Orders

## 2014-04-09 MED ORDER — MECLIZINE HCL 25 MG PO TABS: 25.0000 mg | ORAL_TABLET | Freq: Two times a day (BID) | ORAL | Status: DC | PRN

## 2014-04-09 NOTE — Addendum Note (Signed)
Addended by: Crecencio Mc on: 04/09/2014 01:11 PM   Modules accepted: Orders

## 2014-04-17 ENCOUNTER — Other Ambulatory Visit: Payer: Self-pay

## 2014-04-17 MED ORDER — TRIAMTERENE-HCTZ 37.5-25 MG PO TABS: 1.0000 | ORAL_TABLET | Freq: Every day | ORAL | Status: DC

## 2014-05-08 DIAGNOSIS — K589 Irritable bowel syndrome without diarrhea: Secondary | ICD-10-CM | POA: Diagnosis not present

## 2014-05-08 DIAGNOSIS — K5900 Constipation, unspecified: Secondary | ICD-10-CM | POA: Diagnosis not present

## 2014-05-08 DIAGNOSIS — D126 Benign neoplasm of colon, unspecified: Secondary | ICD-10-CM | POA: Diagnosis not present

## 2014-05-14 ENCOUNTER — Encounter: Payer: Self-pay | Admitting: Internal Medicine

## 2014-05-15 MED ORDER — RED YEAST RICE EXTRACT 600 MG PO CAPS: 1.0000 | ORAL_CAPSULE | Freq: Two times a day (BID) | ORAL | Status: DC

## 2014-05-15 MED ORDER — VITAMIN D3 25 MCG (1000 UT) PO CAPS: 2.0000 | ORAL_CAPSULE | Freq: Every day | ORAL | Status: DC

## 2014-05-15 NOTE — Telephone Encounter (Signed)
My Chart message sent

## 2014-05-21 DIAGNOSIS — H8109 Meniere's disease, unspecified ear: Secondary | ICD-10-CM | POA: Diagnosis not present

## 2014-05-21 DIAGNOSIS — H8111 Benign paroxysmal vertigo, right ear: Secondary | ICD-10-CM | POA: Diagnosis not present

## 2014-05-23 ENCOUNTER — Encounter: Payer: Self-pay | Admitting: Internal Medicine

## 2014-05-23 ENCOUNTER — Other Ambulatory Visit: Payer: Self-pay | Admitting: *Deleted

## 2014-05-23 MED ORDER — MONTELUKAST SODIUM 10 MG PO TABS
10.0000 mg | ORAL_TABLET | Freq: Every day | ORAL | Status: DC
Start: 2014-05-23 — End: 2015-01-27

## 2014-05-29 ENCOUNTER — Encounter: Payer: Self-pay | Admitting: Internal Medicine

## 2014-05-30 ENCOUNTER — Other Ambulatory Visit: Payer: Self-pay | Admitting: Internal Medicine

## 2014-05-30 DIAGNOSIS — H8110 Benign paroxysmal vertigo, unspecified ear: Secondary | ICD-10-CM | POA: Diagnosis not present

## 2014-05-30 DIAGNOSIS — H8111 Benign paroxysmal vertigo, right ear: Secondary | ICD-10-CM | POA: Diagnosis not present

## 2014-06-05 DIAGNOSIS — J309 Allergic rhinitis, unspecified: Secondary | ICD-10-CM | POA: Diagnosis not present

## 2014-06-05 DIAGNOSIS — G4733 Obstructive sleep apnea (adult) (pediatric): Secondary | ICD-10-CM | POA: Diagnosis not present

## 2014-06-27 ENCOUNTER — Ambulatory Visit: Payer: BLUE CROSS/BLUE SHIELD | Attending: Otolaryngology

## 2014-06-27 DIAGNOSIS — R400 Somnolence: Secondary | ICD-10-CM | POA: Diagnosis present

## 2014-06-27 DIAGNOSIS — R51 Headache: Secondary | ICD-10-CM | POA: Diagnosis present

## 2014-06-27 DIAGNOSIS — R0683 Snoring: Secondary | ICD-10-CM | POA: Diagnosis not present

## 2014-06-27 DIAGNOSIS — E669 Obesity, unspecified: Secondary | ICD-10-CM | POA: Diagnosis not present

## 2014-06-27 DIAGNOSIS — G4733 Obstructive sleep apnea (adult) (pediatric): Secondary | ICD-10-CM | POA: Diagnosis not present

## 2014-06-27 DIAGNOSIS — I10 Essential (primary) hypertension: Secondary | ICD-10-CM | POA: Diagnosis present

## 2014-08-14 ENCOUNTER — Other Ambulatory Visit: Payer: Self-pay | Admitting: Internal Medicine

## 2014-08-16 ENCOUNTER — Encounter: Payer: Self-pay | Admitting: Internal Medicine

## 2014-10-16 ENCOUNTER — Encounter: Payer: Self-pay | Admitting: Internal Medicine

## 2014-10-24 ENCOUNTER — Other Ambulatory Visit: Payer: Self-pay | Admitting: Internal Medicine

## 2014-11-06 DIAGNOSIS — G4733 Obstructive sleep apnea (adult) (pediatric): Secondary | ICD-10-CM | POA: Diagnosis not present

## 2014-11-06 DIAGNOSIS — Z1231 Encounter for screening mammogram for malignant neoplasm of breast: Secondary | ICD-10-CM | POA: Diagnosis not present

## 2014-11-06 LAB — HM MAMMOGRAPHY: HM Mammogram: NORMAL

## 2014-11-07 ENCOUNTER — Encounter: Payer: Self-pay | Admitting: Internal Medicine

## 2014-11-15 ENCOUNTER — Telehealth: Payer: Self-pay | Admitting: Internal Medicine

## 2014-11-15 NOTE — Telephone Encounter (Signed)
4:30 TUESDAY

## 2014-11-15 NOTE — Telephone Encounter (Signed)
Pt sent me a message about scheduling a appt for extreme tiredness and raging IBS. No avail appt to sch. Can you let me know where to sch once spoken to Dr. Derrel Nip. Thank You!

## 2014-11-15 NOTE — Telephone Encounter (Signed)
Left message for patient to return call to office. 

## 2014-11-16 ENCOUNTER — Encounter: Payer: Self-pay | Admitting: Internal Medicine

## 2014-11-17 ENCOUNTER — Encounter: Payer: Self-pay | Admitting: Internal Medicine

## 2014-11-19 ENCOUNTER — Other Ambulatory Visit: Payer: Self-pay | Admitting: Internal Medicine

## 2014-11-19 ENCOUNTER — Encounter: Payer: Self-pay | Admitting: Internal Medicine

## 2014-11-19 ENCOUNTER — Ambulatory Visit (INDEPENDENT_AMBULATORY_CARE_PROVIDER_SITE_OTHER): Payer: BLUE CROSS/BLUE SHIELD | Admitting: Internal Medicine

## 2014-11-19 VITALS — BP 124/78 | HR 77 | Temp 98.2°F | Resp 12 | Ht 63.0 in | Wt 218.4 lb

## 2014-11-19 DIAGNOSIS — R1032 Left lower quadrant pain: Secondary | ICD-10-CM

## 2014-11-19 DIAGNOSIS — K5732 Diverticulitis of large intestine without perforation or abscess without bleeding: Secondary | ICD-10-CM | POA: Diagnosis not present

## 2014-11-19 MED ORDER — METRONIDAZOLE 500 MG PO TABS: 500.0000 mg | ORAL_TABLET | Freq: Three times a day (TID) | ORAL | Status: DC

## 2014-11-19 MED ORDER — CIPROFLOXACIN HCL 500 MG PO TABS: 500.0000 mg | ORAL_TABLET | Freq: Two times a day (BID) | ORAL | Status: DC

## 2014-11-19 MED ORDER — SACCHAROMYCES BOULARDII 250 MG PO CAPS: 250.0000 mg | ORAL_CAPSULE | Freq: Two times a day (BID) | ORAL | Status: DC

## 2014-11-19 NOTE — Progress Notes (Signed)
Pre-visit discussion using our clinic review tool. No additional management support is needed unless otherwise documented below in the visit note.  

## 2014-11-19 NOTE — Patient Instructions (Addendum)
I am treating you for diverticulitis with cipro and metronidazole for 7 days,  These are antibiotics.  Please take a probiotic ( Align, Floraque or Culturelle) while you are on the antibiotic to prevent a serious antibiotic associated diarrhea  Called clostirudium dificile colitis and a vaginal yeast infection   CT scan will be ordered    Diverticulitis Diverticulitis is inflammation or infection of small pouches in your colon that form when you have a condition called diverticulosis. The pouches in your colon are called diverticula. Your colon, or large intestine, is where water is absorbed and stool is formed. Complications of diverticulitis can include:  Bleeding.  Severe infection.  Severe pain.  Perforation of your colon.  Obstruction of your colon. CAUSES  Diverticulitis is caused by bacteria. Diverticulitis happens when stool becomes trapped in diverticula. This allows bacteria to grow in the diverticula, which can lead to inflammation and infection. RISK FACTORS People with diverticulosis are at risk for diverticulitis. Eating a diet that does not include enough fiber from fruits and vegetables may make diverticulitis more likely to develop. SYMPTOMS  Symptoms of diverticulitis may include:  Abdominal pain and tenderness. The pain is normally located on the left side of the abdomen, but may occur in other areas.  Fever and chills.  Bloating.  Cramping.  Nausea.  Vomiting.  Constipation.  Diarrhea.  Blood in your stool. DIAGNOSIS  Your health care provider will ask you about your medical history and do a physical exam. You may need to have tests done because many medical conditions can cause the same symptoms as diverticulitis. Tests may include:  Blood tests.  Urine tests.  Imaging tests of the abdomen, including X-rays and CT scans. When your condition is under control, your health care provider may recommend that you have a colonoscopy. A colonoscopy can  show how severe your diverticula are and whether something else is causing your symptoms. TREATMENT  Most cases of diverticulitis are mild and can be treated at home. Treatment may include:  Taking over-the-counter pain medicines.  Following a clear liquid diet.  Taking antibiotic medicines by mouth for 7-10 days. More severe cases may be treated at a hospital. Treatment may include:  Not eating or drinking.  Taking prescription pain medicine.  Receiving antibiotic medicines through an IV tube.  Receiving fluids and nutrition through an IV tube.  Surgery. HOME CARE INSTRUCTIONS   Follow your health care provider's instructions carefully.  Follow a full liquid diet or other diet as directed by your health care provider. After your symptoms improve, your health care provider may tell you to change your diet. He or she may recommend you eat a high-fiber diet. Fruits and vegetables are good sources of fiber. Fiber makes it easier to pass stool.  Take fiber supplements or probiotics as directed by your health care provider.  Only take medicines as directed by your health care provider.  Keep all your follow-up appointments. SEEK MEDICAL CARE IF:   Your pain does not improve.  You have a hard time eating food.  Your bowel movements do not return to normal. SEEK IMMEDIATE MEDICAL CARE IF:   Your pain becomes worse.  Your symptoms do not get better.  Your symptoms suddenly get worse.  You have a fever.  You have repeated vomiting.  You have bloody or black, tarry stools. MAKE SURE YOU:   Understand these instructions.  Will watch your condition.  Will get help right away if you are not doing well or  get worse.   This information is not intended to replace advice given to you by your health care provider. Make sure you discuss any questions you have with your health care provider.   Document Released: 10/21/2004 Document Revised: 01/16/2013 Document Reviewed:  12/06/2012 Elsevier Interactive Patient Education [employer].

## 2014-11-19 NOTE — Progress Notes (Signed)
Subjective:  Patient ID: Barbara Wade, female    DOB: 09/05/1960  Age: 54 y.o. MRN: [id number]  CC: The primary encounter diagnosis was Diverticulitis of colon. Diagnoses of LLQ pain and LLQ abdominal pain were also pertinent to this visit.  HPI Barbara Wade presents for evaluation  of suspected diverticulitis.   She has a history of  IBS , feels it has been flaring for 6 weeks,  With prolonged bouts of  loose stools This week developed LLQ pain on Saturday not relieved with moving bowels.  Had mucous and blood mixed with stool on Saturday,  No blood since then but stools are every soft.  Last colonoscopy was 3 years ago  Diverticulosis was noted. No recent travel.  Does not use laxatives.   Outpatient Prescriptions Prior to Visit  Medication Sig Dispense Refill  . Cholecalciferol (VITAMIN D3) 1000 UNITS CAPS Take 2 capsules (2,000 Units total) by mouth daily. 60 capsule 11  . dicyclomine (BENTYL) 10 MG capsule Take 1 capsule (10 mg total) by mouth 4 (four) times daily -  before meals and at bedtime. 120 capsule 2  . EPINEPHrine (EPIPEN) 0.3 mg/0.3 mL DEVI Inject 0.3 mLs (0.3 mg total) into the muscle once. 1 Device 6  . fexofenadine (ALLEGRA) 180 MG tablet Take 1 tablet (180 mg total) by mouth daily. 30 tablet 5  . fluticasone (FLONASE) 50 MCG/ACT nasal spray Place 2 sprays into the nose daily.    . hyoscyamine (ANASPAZ) 0.125 MG TBDP Place 0.125 mg under the tongue as needed.      . Lactulose 20 GM/30ML SOLN Take 15 mLs (10 g total) by mouth every 6 (six) hours as needed (for relief of constipation). 240 mL 0  . meclizine (ANTIVERT) 25 MG tablet Take 1 tablet (25 mg total) by mouth 2 (two) times daily as needed. 30 tablet 4  . montelukast (SINGULAIR) 10 MG tablet Take 1 tablet (10 mg total) by mouth at bedtime. 90 tablet 1  . montelukast (SINGULAIR) 10 MG tablet TAKE 1 TABLET AT BEDTIME 90 tablet 0  . pantoprazole (PROTONIX) 40 MG tablet TAKE 1 TABLET DAILY 90 tablet 2   . Red Yeast Rice Extract ([employer]S RED YEAST RICE) 600 MG CAPS Take 1 capsule (600 mg total) by mouth 2 (two) times daily after a meal. 180 capsule 1  . triamterene-hydrochlorothiazide (MAXZIDE-25) 37.5-25 MG per tablet Take 1 tablet by mouth daily. 30 tablet 5  . Wheat Dextrin (BENEFIBER) POWD 2 tsp tid as directed 529 g 1   No facility-administered medications prior to visit.    Review of Systems;  Patient denies headache, fevers, malaise, unintentional weight loss, skin rash, eye pain, sinus congestion and sinus pain, sore throat, dysphagia,  hemoptysis , cough, dyspnea, wheezing, chest pain, palpitations, orthopnea, edema, abdominal pain, nausea, melena, diarrhea, constipation, flank pain, dysuria, hematuria, urinary  Frequency, nocturia, numbness, tingling, seizures,  Focal weakness, Loss of consciousness,  Tremor, insomnia, depression, anxiety, and suicidal ideation.      Objective:  BP 124/78 mmHg  Pulse 77  Temp(Src) 98.2 F (36.8 C) (Oral)  Resp 12  Ht 5' 3"  (1.6 m)  Wt 218 lb 6 oz (99.054 kg)  BMI 38.69 kg/m2  SpO2 98%  BP Readings from Last 3 Encounters:  11/19/14 124/78  03/08/14 112/76  11/05/13 108/72    Wt Readings from Last 3 Encounters:  11/19/14 218 lb 6 oz (99.054 kg)  03/08/14 197 lb 8 oz (89.585 kg)  11/05/13 197  lb 12 oz (89.699 kg)    General appearance: alert, cooperative and appears stated age Ears: normal TM's and external ear canals both ears Throat: lips, mucosa, and tongue normal; teeth and gums normal Neck: no adenopathy, no carotid bruit, supple, symmetrical, trachea midline and thyroid not enlarged, symmetric, no tenderness/mass/nodules Back: symmetric, no curvature. ROM normal. No CVA tenderness. Lungs: clear to auscultation bilaterally Heart: regular rate and rhythm, S1, S2 normal, no murmur, click, rub or gallop Abdomen: soft, non-tender; bowel sounds normal; no masses,  no organomegaly Pulses: 2+ and symmetric Skin: Skin color, texture,  turgor normal. No rashes or lesions Lymph nodes: Cervical, supraclavicular, and axillary nodes normal.  Lab Results  Component Value Date   HGBA1C 5.6 09/07/2013   HGBA1C 5.7 12/20/2012   HGBA1C 6.1 12/13/2011    Lab Results  Component Value Date   CREATININE 0.84 11/19/2014   CREATININE 0.8 10/12/2013   CREATININE 0.9 09/07/2013    Lab Results  Component Value Date   WBC 7.8 11/19/2014   HGB 15.1* 11/19/2014   HCT 44.1 11/19/2014   PLT 311.0 11/19/2014   GLUCOSE 173* 11/19/2014   CHOL 212* 10/12/2013   TRIG 171* 10/12/2013   HDL 53 10/12/2013   LDLDIRECT 129.9 12/20/2012   LDLCALC 125 10/12/2013   ALT 56* 11/19/2014   AST 36 11/19/2014   NA 140 11/19/2014   K 4.5 11/19/2014   CL 101 11/19/2014   CREATININE 0.84 11/19/2014   BUN 12 11/19/2014   CO2 30 11/19/2014   TSH 3.02 10/12/2013   HGBA1C 5.6 09/07/2013   MICROALBUR 1.1 12/20/2012    No results found.  Assessment & Plan:   Problem List Items Addressed This Visit    LLQ abdominal pain    Need to rule out diverticulitis given reports of blood and mucous in stool.   empiric cipro/flagyl and bland diet .  Ct ordered.   Lab Results  Component Value Date   WBC 7.8 11/19/2014   HGB 15.1* 11/19/2014   HCT 44.1 11/19/2014   MCV 95.4 11/19/2014   PLT 311.0 11/19/2014   Lab Results  Component Value Date   ESRSEDRATE 68* 11/19/2014   Lab Results  Component Value Date   CRP 2.7 11/19/2014          Other Visit Diagnoses    Diverticulitis of colon    -  Primary    Relevant Medications    ciprofloxacin (CIPRO) 500 MG tablet    metroNIDAZOLE (FLAGYL) 500 MG tablet    Other Relevant Orders    Comp Met (CMET) (Completed)    CBC with Differential/Platelet (Completed)    C-reactive protein (Completed)    Sedimentation rate (Completed)    CT Abdomen Pelvis W Contrast    LLQ pain        Relevant Orders    POCT urinalysis dipstick (Completed)    Urine Culture    Urinalysis, Routine w reflex microscopic        I am having Barbara Wade start on ciprofloxacin. I am also having her maintain her hyoscyamine, Lactulose, EPINEPHrine, fluticasone, dicyclomine, BENEFIBER, meclizine, fexofenadine, triamterene-hydrochlorothiazide, Red Yeast Rice Extract, Vitamin D3, montelukast, pantoprazole, montelukast, and metroNIDAZOLE.  Meds ordered this encounter  Medications  . ciprofloxacin (CIPRO) 500 MG tablet    Sig: Take 1 tablet (500 mg total) by mouth 2 (two) times daily.    Dispense:  14 tablet    Refill:  0  . DISCONTD: metroNIDAZOLE (FLAGYL) 500 MG tablet  Sig: Take 1 tablet (500 mg total) by mouth 3 (three) times daily.    Dispense:  21 tablet    Refill:  0  . metroNIDAZOLE (FLAGYL) 500 MG tablet    Sig: Take 1 tablet (500 mg total) by mouth 3 (three) times daily.    Dispense:  21 tablet    Refill:  0    Medications Discontinued During This Encounter  Medication Reason  . metroNIDAZOLE (FLAGYL) 500 MG tablet Reorder    Follow-up: No Follow-up on file.   Crecencio Mc, MD

## 2014-11-20 LAB — CBC WITH DIFFERENTIAL/PLATELET
BASOS PCT: 0.6 % (ref 0.0–3.0)
Basophils Absolute: 0 10*3/uL (ref 0.0–0.1)
EOS PCT: 2.2 % (ref 0.0–5.0)
Eosinophils Absolute: 0.2 10*3/uL (ref 0.0–0.7)
HCT: 44.1 % (ref 36.0–46.0)
HEMOGLOBIN: 15.1 g/dL — AB (ref 12.0–15.0)
Lymphocytes Relative: 30.7 % (ref 12.0–46.0)
Lymphs Abs: 2.4 10*3/uL (ref 0.7–4.0)
MCHC: 34.2 g/dL (ref 30.0–36.0)
MCV: 95.4 fl (ref 78.0–100.0)
MONOS PCT: 8.2 % (ref 3.0–12.0)
Monocytes Absolute: 0.6 10*3/uL (ref 0.1–1.0)
Neutro Abs: 4.6 10*3/uL (ref 1.4–7.7)
Neutrophils Relative %: 58.3 % (ref 43.0–77.0)
Platelets: 311 10*3/uL (ref 150.0–400.0)
RBC: 4.62 Mil/uL (ref 3.87–5.11)
RDW: 12.9 % (ref 11.5–15.5)
WBC: 7.8 10*3/uL (ref 4.0–10.5)

## 2014-11-20 LAB — COMPREHENSIVE METABOLIC PANEL
ALK PHOS: 120 U/L — AB (ref 39–117)
ALT: 56 U/L — ABNORMAL HIGH (ref 0–35)
AST: 36 U/L (ref 0–37)
Albumin: 3.9 g/dL (ref 3.5–5.2)
BUN: 12 mg/dL (ref 6–23)
CHLORIDE: 101 meq/L (ref 96–112)
CO2: 30 mEq/L (ref 19–32)
Calcium: 9.8 mg/dL (ref 8.4–10.5)
Creatinine, Ser: 0.84 mg/dL (ref 0.40–1.20)
GFR: 74.38 mL/min (ref >60.00)
GLUCOSE: 173 mg/dL — AB (ref 70–99)
POTASSIUM: 4.5 meq/L (ref 3.5–5.1)
Sodium: 140 mEq/L (ref 135–145)
TOTAL PROTEIN: 7.3 g/dL (ref 6.0–8.3)
Total Bilirubin: 0.5 mg/dL (ref 0.2–1.2)

## 2014-11-20 LAB — POCT URINALYSIS DIPSTICK
BILIRUBIN UA: NEGATIVE
Glucose, UA: NEGATIVE
Ketones, UA: NEGATIVE
LEUKOCYTES UA: NEGATIVE
NITRITE UA: NEGATIVE
PH UA: 5.5
Protein, UA: NEGATIVE
RBC UA: NEGATIVE
Spec Grav, UA: 1.025
UROBILINOGEN UA: 0.2

## 2014-11-20 LAB — C-REACTIVE PROTEIN: CRP: 2.7 mg/dL (ref 0.5–20.0)

## 2014-11-20 LAB — SEDIMENTATION RATE: Sed Rate: 55 mm/hr — ABNORMAL HIGH (ref 0–22)

## 2014-11-21 DIAGNOSIS — R1032 Left lower quadrant pain: Secondary | ICD-10-CM | POA: Insufficient documentation

## 2014-11-21 NOTE — Assessment & Plan Note (Addendum)
UA is normal. Need to rule out diverticulitis given reports of blood and mucous in stool.   empiric cipro/flagyl and bland diet .  Ct ordered.   Lab Results  Component Value Date   WBC 7.8 11/19/2014   HGB 15.1* 11/19/2014   HCT 44.1 11/19/2014   MCV 95.4 11/19/2014   PLT 311.0 11/19/2014   Lab Results  Component Value Date   ESRSEDRATE 55* 11/19/2014   Lab Results  Component Value Date   CRP 2.7 11/19/2014

## 2014-11-25 ENCOUNTER — Ambulatory Visit
Admission: RE | Admit: 2014-11-25 | Discharge: 2014-11-25 | Disposition: A | Discharge status: Home / Self Care | Payer: BLUE CROSS/BLUE SHIELD | Source: Ambulatory Visit | Attending: Internal Medicine | Admitting: Internal Medicine

## 2014-11-25 DIAGNOSIS — K5732 Diverticulitis of large intestine without perforation or abscess without bleeding: Secondary | ICD-10-CM | POA: Diagnosis not present

## 2014-11-25 DIAGNOSIS — N281 Cyst of kidney, acquired: Secondary | ICD-10-CM | POA: Insufficient documentation

## 2014-11-25 DIAGNOSIS — K760 Fatty (change of) liver, not elsewhere classified: Secondary | ICD-10-CM | POA: Insufficient documentation

## 2014-11-25 DIAGNOSIS — R1030 Lower abdominal pain, unspecified: Secondary | ICD-10-CM | POA: Diagnosis not present

## 2014-11-25 DIAGNOSIS — R197 Diarrhea, unspecified: Secondary | ICD-10-CM | POA: Diagnosis not present

## 2014-11-25 MED ORDER — IOHEXOL 300 MG/ML  SOLN
125.0000 mL | Freq: Once | INTRAMUSCULAR | Status: AC | PRN
  Administered 2014-11-25: 125 mL via INTRAVENOUS

## 2014-11-26 ENCOUNTER — Encounter: Payer: Self-pay | Admitting: Internal Medicine

## 2014-11-26 MED ORDER — DOCUSATE SODIUM 250 MG PO CAPS: 250.0000 mg | ORAL_CAPSULE | Freq: Every day | ORAL | Status: DC

## 2014-12-02 ENCOUNTER — Other Ambulatory Visit: Payer: Self-pay | Admitting: Internal Medicine

## 2014-12-12 ENCOUNTER — Encounter: Payer: Self-pay | Admitting: Internal Medicine

## 2015-01-20 ENCOUNTER — Other Ambulatory Visit: Payer: Self-pay | Admitting: Internal Medicine

## 2015-01-27 ENCOUNTER — Encounter: Payer: Self-pay | Admitting: Emergency Medicine

## 2015-01-27 ENCOUNTER — Emergency Department: Payer: BLUE CROSS/BLUE SHIELD

## 2015-01-27 ENCOUNTER — Emergency Department
Admission: EM | Admit: 2015-01-27 | Discharge: 2015-01-27 | Disposition: A | Discharge status: Home / Self Care | Payer: BLUE CROSS/BLUE SHIELD | Attending: Emergency Medicine | Admitting: Emergency Medicine

## 2015-01-27 DIAGNOSIS — Z7951 Long term (current) use of inhaled steroids: Secondary | ICD-10-CM | POA: Insufficient documentation

## 2015-01-27 DIAGNOSIS — Z79899 Other long term (current) drug therapy: Secondary | ICD-10-CM | POA: Insufficient documentation

## 2015-01-27 DIAGNOSIS — R739 Hyperglycemia, unspecified: Secondary | ICD-10-CM

## 2015-01-27 DIAGNOSIS — E1165 Type 2 diabetes mellitus with hyperglycemia: Secondary | ICD-10-CM | POA: Diagnosis not present

## 2015-01-27 DIAGNOSIS — R7989 Other specified abnormal findings of blood chemistry: Secondary | ICD-10-CM | POA: Diagnosis not present

## 2015-01-27 DIAGNOSIS — R05 Cough: Secondary | ICD-10-CM | POA: Diagnosis not present

## 2015-01-27 DIAGNOSIS — Z87891 Personal history of nicotine dependence: Secondary | ICD-10-CM | POA: Diagnosis not present

## 2015-01-27 LAB — URINALYSIS COMPLETE WITH MICROSCOPIC (ARMC ONLY)
BACTERIA UA: NONE SEEN
Bilirubin Urine: NEGATIVE
Hgb urine dipstick: NEGATIVE
Nitrite: NEGATIVE
PROTEIN: NEGATIVE mg/dL
Specific Gravity, Urine: 1.03 (ref 1.005–1.030)
pH: 5 (ref 5.0–8.0)

## 2015-01-27 LAB — GLUCOSE, CAPILLARY
GLUCOSE-CAPILLARY: 376 mg/dL — AB (ref 65–99)
GLUCOSE-CAPILLARY: 305 mg/dL — AB (ref 65–99)
GLUCOSE-CAPILLARY: 288 mg/dL — AB (ref 65–99)
GLUCOSE-CAPILLARY: 259 mg/dL — AB (ref 65–99)

## 2015-01-27 LAB — CBC
HCT: 42.4 % (ref 35.0–47.0)
HEMOGLOBIN: 14.8 g/dL (ref 12.0–16.0)
MCH: 32.3 pg (ref 26.0–34.0)
MCHC: 34.8 g/dL (ref 32.0–36.0)
MCV: 92.8 fL (ref 80.0–100.0)
Platelets: 239 10*3/uL (ref 150–440)
RBC: 4.57 MIL/uL (ref 3.80–5.20)
RDW: 12.9 % (ref 11.5–14.5)
WBC: 6.6 10*3/uL (ref 3.6–11.0)

## 2015-01-27 LAB — BASIC METABOLIC PANEL
ANION GAP: 9 (ref 5–15)
BUN: 14 mg/dL (ref 6–20)
CALCIUM: 9.5 mg/dL (ref 8.9–10.3)
CHLORIDE: 102 mmol/L (ref 101–111)
CO2: 24 mmol/L (ref 22–32)
Creatinine, Ser: 0.86 mg/dL (ref 0.44–1.00)
GFR calc non Af Amer: >60 mL/min (ref >60)
GLUCOSE: 401 mg/dL — AB (ref 65–99)
Potassium: 3.8 mmol/L (ref 3.5–5.1)
Sodium: 135 mmol/L (ref 135–145)

## 2015-01-27 LAB — TROPONIN I

## 2015-01-27 MED ORDER — SODIUM CHLORIDE 0.9 % IV BOLUS (SEPSIS)
1000.0000 mL | Freq: Once | INTRAVENOUS | Status: AC
  Administered 2015-01-27: 1000 mL via INTRAVENOUS

## 2015-01-27 MED ORDER — SODIUM CHLORIDE 0.9 % IV BOLUS (SEPSIS)
1000.0000 mL | Freq: Once | INTRAVENOUS | Status: AC
Start: 2015-01-27 — End: 2015-01-27
  Administered 2015-01-27: 1000 mL via INTRAVENOUS

## 2015-01-27 MED ORDER — METFORMIN HCL 500 MG PO TABS: 500.0000 mg | ORAL_TABLET | Freq: Two times a day (BID) | ORAL | Status: DC

## 2015-01-27 MED ORDER — INSULIN ASPART 100 UNIT/ML ~~LOC~~ SOLN
8.0000 [IU] | Freq: Once | SUBCUTANEOUS | Status: AC
  Administered 2015-01-27: 8 [IU] via INTRAVENOUS
  Filled 2015-01-27: qty 8

## 2015-01-27 NOTE — ED Notes (Signed)
Pt presents with elevated blood sugar, lightheadedness and increased thirst. Pt states she was diagnosed with pre-diabetes and had been managing her blood sugar with diet and exercise. Pt states symptoms started today.

## 2015-01-27 NOTE — ED Notes (Signed)
Pt states has symptoms of "lightheadedness" that started this morning . Pt states she was confused a little this morning and checked blood sugar at home as 441 without previous hx of diabetes. Alert and oriented x4

## 2015-01-27 NOTE — Discharge Instructions (Signed)
Hyperglycemia °Hyperglycemia occurs when the glucose (sugar) in your blood is too high. Hyperglycemia can happen for many reasons, but it most often happens to people who do not know they have diabetes or are not managing their diabetes properly.  °CAUSES  °Whether you have diabetes or not, there are other causes of hyperglycemia. Hyperglycemia can occur when you have diabetes, but it can also occur in other situations that you might not be as aware of, such as: °Diabetes °· If you have diabetes and are having problems controlling your blood glucose, hyperglycemia could occur because of some of the following reasons: °¨ Not following your meal plan. °¨ Not taking your diabetes medications or not taking it properly. °¨ Exercising less or doing less activity than you normally do. °¨ Being sick. °Pre-diabetes °· This cannot be ignored. Before people develop Type 2 diabetes, they almost always have "pre-diabetes." This is when your blood glucose levels are higher than normal, but not yet high enough to be diagnosed as diabetes. Research has shown that some long-term damage to the body, especially the heart and circulatory system, may already be occurring during pre-diabetes. If you take action to manage your blood glucose when you have pre-diabetes, you may delay or prevent Type 2 diabetes from developing. °Stress °· If you have diabetes, you may be "diet" controlled or on oral medications or insulin to control your diabetes. However, you may find that your blood glucose is higher than usual in the hospital whether you have diabetes or not. This is often referred to as "stress hyperglycemia." Stress can elevate your blood glucose. This happens because of hormones put out by the body during times of stress. If stress has been the cause of your high blood glucose, it can be followed regularly by your caregiver. That way he/she can make sure your hyperglycemia does not continue to get worse or progress to  diabetes. °Steroids °· Steroids are medications that act on the infection fighting system (immune system) to block inflammation or infection. One side effect can be a rise in blood glucose. Most people can produce enough extra insulin to allow for this rise, but for those who cannot, steroids make blood glucose levels go even higher. It is not unusual for steroid treatments to "uncover" diabetes that is developing. It is not always possible to determine if the hyperglycemia will go away after the steroids are stopped. A special blood test called an A1c is sometimes done to determine if your blood glucose was elevated before the steroids were started. °SYMPTOMS °· Thirsty. °· Frequent urination. °· Dry mouth. °· Blurred vision. °· Tired or fatigue. °· Weakness. °· Sleepy. °· Tingling in feet or leg. °DIAGNOSIS  °Diagnosis is made by monitoring blood glucose in one or all of the following ways: °· A1c test. This is a chemical found in your blood. °· Fingerstick blood glucose monitoring. °· Laboratory results. °TREATMENT  °First, knowing the cause of the hyperglycemia is important before the hyperglycemia can be treated. Treatment may include, but is not be limited to: °· Education. °· Change or adjustment in medications. °· Change or adjustment in meal plan. °· Treatment for an illness, infection, etc. °· More frequent blood glucose monitoring. °· Change in exercise plan. °· Decreasing or stopping steroids. °· Lifestyle changes. °HOME CARE INSTRUCTIONS  °· Test your blood glucose as directed. °· Exercise regularly. Your caregiver will give you instructions about exercise. Pre-diabetes or diabetes which comes on with stress is helped by exercising. °· Eat wholesome,   balanced meals. Eat often and at regular, fixed times. Your caregiver or nutritionist will give you a meal plan to guide your sugar intake.  Being at an ideal weight is important. If needed, losing as little as 10 to 15 pounds may help improve blood  glucose levels. SEEK MEDICAL CARE IF:   You have questions about medicine, activity, or diet.  You continue to have symptoms (problems such as increased thirst, urination, or weight gain). SEEK IMMEDIATE MEDICAL CARE IF:   You are vomiting or have diarrhea.  Your breath smells fruity.  You are breathing faster or slower.  You are very sleepy or incoherent.  You have numbness, tingling, or pain in your feet or hands.  You have chest pain.  Your symptoms get worse even though you have been following your caregiver's orders.  If you have any other questions or concerns.   This information is not intended to replace advice given to you by your health care provider. Make sure you discuss any questions you have with your health care provider.   Document Released: 07/07/2000 Document Revised: 04/05/2011 Document Reviewed: 09/17/2014 Elsevier Interactive Patient Education [employer].  Please return immediately if condition worsens. Please contact her primary physician or the physician you were given for referral. If you have any specialist physicians involved in her treatment and plan please also contact them. Thank you for using [facility].

## 2015-01-27 NOTE — ED Provider Notes (Signed)
Time Seen: Approximately 1605 I have reviewed the triage notes  Chief Complaint: Dizziness; Abnormal Lab; and Hyperglycemia   History of Present Illness: Barbara Wade is a 55 y.o. female who is been diagnosed in the past as being "borderline diabetic". Patient states she monitor her fingerstick over the last couple of days and noticed that her blood sugar was in the 400 range. She states she had some upper respiratory symptoms of previous week but no fever. States otherwise he felt fine except for thirst and frequent urination. She denies any burning with urination or blood in her urine. She denies any chest pain, shortness of breath, productive cough or wheezing. She denies any unusual skin lesions. Some generalized nonfocal weakness. She states that she's felt somewhat lightheaded and has had trouble concentrating but otherwise no trouble with speech or swallowing. No headache or neck stiffness. Past Medical History  Diagnosis Date  . Allergy   . Sleep apnea   . Anaphylaxis 2007    occurred during allergy testing to environmental allergen  . Neuromuscular disorder ([place])   . Fibromyalgia 2004  . Depression     managed currently with [facility]  . Hepatitis B virus infection   . Irritable bowel syndrome (IBS)     Patient Active Problem List   Diagnosis Date Noted  . LLQ abdominal pain 11/21/2014  . Esophageal reflux 11/12/2013  . Sliding hiatal hernia 11/12/2013  . Trigger finger, acquired 09/19/2013  . Nausea alone 03/09/2013  . Seasonal affective disorder ([place]) 12/20/2012  . Postprandial epigastric pain 11/01/2012  . Rosacea 05/02/2012  . Irritable bowel syndrome (IBS)   . Episcleritis of right eye 04/21/2012  . Obesity (BMI 30-39.9) 01/31/2012  . Vitamin D deficiency 01/05/2012  . Hand pain, right 12/15/2011  . Pain of left heel 12/15/2011  . Urinary frequency 10/30/2011  . Lymphedema of leg 07/17/2011  . Rhinitis, allergic 07/17/2011  . Diabetes mellitus  type 2, diet-controlled ([place])   . Hyperlipidemia with target LDL less than 70 12/02/2010  . Hepatitis B virus infection   . Edema 10/18/2010  . History of colon polyps 10/16/2010  . Screening for breast cancer 10/16/2010  . Anaphylaxis   . Fibromyalgia     Past Surgical History  Procedure Laterality Date  . Knee arthroscopy    . Trigger finger release    . Cholecystectomy      elective  . Abdominal hysterectomy  2000    for fibroids    Past Surgical History  Procedure Laterality Date  . Knee arthroscopy    . Trigger finger release    . Cholecystectomy      elective  . Abdominal hysterectomy  2000    for fibroids    Current Outpatient Rx  Name  Route  Sig  Dispense  Refill  . Cholecalciferol (VITAMIN D3) 1000 UNITS CAPS   Oral   Take 2 capsules (2,000 Units total) by mouth daily.   60 capsule   11   . dicyclomine (BENTYL) 10 MG capsule   Oral   Take 1 capsule (10 mg total) by mouth 4 (four) times daily -  before meals and at bedtime.   120 capsule   2   . EPINEPHrine (EPIPEN) 0.3 mg/0.3 mL DEVI   Intramuscular   Inject 0.3 mLs (0.3 mg total) into the muscle once.   1 Device   6   . fexofenadine (ALLEGRA) 180 MG tablet   Oral   Take 1 tablet (180  mg total) by mouth daily.   30 tablet   5   . fluticasone (FLONASE) 50 MCG/ACT nasal spray   Nasal   Place 2 sprays into the nose daily.         . hyoscyamine (ANASPAZ) 0.125 MG TBDP   Sublingual   Place 0.125 mg under the tongue as needed.           . meclizine (ANTIVERT) 25 MG tablet   Oral   Take 1 tablet (25 mg total) by mouth 2 (two) times daily as needed.   30 tablet   4   . montelukast (SINGULAIR) 10 MG tablet      TAKE 1 TABLET AT BEDTIME   90 tablet   3   . pantoprazole (PROTONIX) 40 MG tablet      TAKE 1 TABLET DAILY   90 tablet   2   . Red Yeast Rice Extract ([employer]S RED YEAST RICE) 600 MG CAPS   Oral   Take 1 capsule (600 mg total) by mouth 2 (two) times daily after a meal.   180  capsule   1   . triamterene-hydrochlorothiazide (MAXZIDE-25) 37.5-25 MG tablet      TAKE ONE TABLET BY MOUTH ONE TIME DAILY   30 tablet   5     Allergies:  Hydrocodone  Family History: Family History  Problem Relation Age of Onset  . Heart disease Maternal Aunt   . Heart disease Maternal Uncle   . Heart disease Maternal Grandmother   . Stroke Maternal Grandmother     Social History: Social History  Substance Use Topics  . Smoking status: Former Smoker    Quit date: 10/16/2002  . Smokeless tobacco: Never Used  . Alcohol Use: Yes     Comment: occasional     Review of Systems:   10 point review of systems was performed and was otherwise negative:  Constitutional: No fever Eyes: No visual disturbances ENT: No sore throat, ear pain Cardiac: No chest pain Respiratory: No shortness of breath, wheezing, or stridor Abdomen: No abdominal pain, no vomiting, No diarrhea Endocrine: No weight loss, No night sweats Extremities: No peripheral edema, cyanosis Skin: No rashes, easy bruising Neurologic: No focal weakness, trouble with speech or swollowing Urologic: No dysuria, Hematuria, or urinary frequency   Physical Exam:  ED Triage Vitals  Enc Vitals Group     BP 01/27/15 1524 184/85 mmHg     Pulse Rate 01/27/15 1524 80     Resp 01/27/15 1524 18     Temp 01/27/15 1524 98.6 F (37 C)     Temp Source 01/27/15 1524 Oral     SpO2 01/27/15 1524 96 %     Weight 01/27/15 1524 220 lb (99.791 kg)     Height 01/27/15 1524 5\' 3"  (1.6 m)     Head Cir --      Peak Flow --      Pain Score --      Pain Loc --      Pain Edu? --      Excl. in [place]? --     General: Awake , Alert , and Oriented times 3; GCS 15 Head: Normal cephalic , atraumatic Eyes: Pupils equal , round, reactive to light Nose/Throat: No nasal drainage, patent upper airway without erythema or exudate. Nonketotic breath Neck: Supple, Full range of motion, No anterior adenopathy or palpable thyroid  masses Lungs: Clear to ascultation without wheezes , rhonchi, or rales Heart: Regular rate, regular rhythm  without murmurs , gallops , or rubs Abdomen: Soft, non tender without rebound, guarding , or rigidity; bowel sounds positive and symmetric in all 4 quadrants. No organomegaly .        Extremities: 2 plus symmetric pulses. No edema, clubbing or cyanosis Neurologic: normal ambulation, Motor symmetric without deficits, sensory intact Skin: warm, dry, no rashes   Labs:   All laboratory work was reviewed including any pertinent negatives or positives listed below:  Labs Reviewed  GLUCOSE, CAPILLARY - Abnormal; Notable for the following:    Glucose-Capillary 376 (*)    All other components within normal limits  BASIC METABOLIC PANEL - Abnormal; Notable for the following:    Glucose, Bld 401 (*)    All other components within normal limits  URINALYSIS COMPLETEWITH MICROSCOPIC ([facility] ONLY) - Abnormal; Notable for the following:    Color, Urine YELLOW (*)    APPearance CLEAR (*)    Glucose, UA >500 (*)    Ketones, ur TRACE (*)    Leukocytes, UA 1+ (*)    Squamous Epithelial / LPF 0-5 (*)    All other components within normal limits  CBC  TROPONIN I  CBG MONITORING, ED   reviewed the patient's laboratory work showed some trace ketones in her urine and no indications of acidosis with hyperglycemia.  EKG:  ED ECG REPORT I, Daymon Larsen, the attending physician, personally viewed and interpreted this ECG.  Date: 01/27/2015 EKG Time: 1726 Rate: 61 Rhythm: normal sinus rhythm QRS Axis: normal Intervals: normal ST/T Wave abnormalities: Nonspecific T wave abnormalities Conduction Disutrbances: none Narrative Interpretation: unremarkable No acute ischemic change noted   Radiology:   23rd, no other lung/ heart symptoms or surgeries per pt, shielded  EXAM: CHEST 2 VIEW  COMPARISON: 11/03/2012  FINDINGS: Right hemidiaphragm eventration anteriorly. Midline trachea.  Normal heart size. Tortuous thoracic aorta. No pleural effusion or pneumothorax. Clear lungs.  IMPRESSION: No acute cardiopulmonary disease.     I personally reviewed the radiologic studies   P ED Course:  Patient's stay here showed some gradual improvement. She was given IV insulin and was given 2 L of normal saline. Blood sugar at the time of discharge was below 250. I felt the patient would do well on outpatient basis and did not feel that she required further evaluation for diabetic ketoacidosis. Patient has the capability of checking her blood sugars at home and she was advised to record the numbers and I will start her on metformin. She was given hyperglycemia instructions. There is no obvious infectious or other causes for her hyperglycemia.*    Assessment: *  hyperglycemia    Plan:  Outpatient management Patient was advised to return immediately if condition worsens. Patient was advised to follow up with their primary care physician or other specialized physicians involved in their outpatient care            Daymon Larsen, MD 01/27/15 2332

## 2015-01-28 ENCOUNTER — Encounter: Payer: Self-pay | Admitting: Internal Medicine

## 2015-01-28 ENCOUNTER — Other Ambulatory Visit: Payer: Self-pay | Admitting: Internal Medicine

## 2015-01-28 DIAGNOSIS — IMO0002 Reserved for concepts with insufficient information to code with codable children: Secondary | ICD-10-CM

## 2015-01-28 DIAGNOSIS — N182 Chronic kidney disease, stage 2 (mild): Principal | ICD-10-CM

## 2015-01-28 DIAGNOSIS — E1122 Type 2 diabetes mellitus with diabetic chronic kidney disease: Secondary | ICD-10-CM

## 2015-01-28 DIAGNOSIS — E1165 Type 2 diabetes mellitus with hyperglycemia: Principal | ICD-10-CM

## 2015-01-28 NOTE — Telephone Encounter (Signed)
Pt left a vm regarding yesterday she went to the ED with Glucose 441 was not feeling right. Pt sent Dr Derrel Nip a mess she wants to know does Dr Derrel Nip wants her to fill the Rx or wait. It was for Metphormin 500 mg or wait til her appt? Pt number is [phone] Thank You!

## 2015-01-30 ENCOUNTER — Encounter: Payer: Self-pay | Admitting: Internal Medicine

## 2015-01-30 ENCOUNTER — Ambulatory Visit: Payer: BLUE CROSS/BLUE SHIELD | Admitting: *Deleted

## 2015-01-30 ENCOUNTER — Ambulatory Visit (INDEPENDENT_AMBULATORY_CARE_PROVIDER_SITE_OTHER): Payer: BLUE CROSS/BLUE SHIELD | Admitting: Internal Medicine

## 2015-01-30 VITALS — BP 112/78 | HR 66 | Temp 97.9°F | Resp 12 | Ht 63.0 in | Wt 215.8 lb

## 2015-01-30 DIAGNOSIS — R358 Other polyuria: Secondary | ICD-10-CM

## 2015-01-30 DIAGNOSIS — N182 Chronic kidney disease, stage 2 (mild): Secondary | ICD-10-CM | POA: Diagnosis not present

## 2015-01-30 DIAGNOSIS — E1165 Type 2 diabetes mellitus with hyperglycemia: Secondary | ICD-10-CM | POA: Diagnosis not present

## 2015-01-30 DIAGNOSIS — IMO0001 Reserved for inherently not codable concepts without codable children: Secondary | ICD-10-CM

## 2015-01-30 DIAGNOSIS — E1122 Type 2 diabetes mellitus with diabetic chronic kidney disease: Secondary | ICD-10-CM | POA: Diagnosis not present

## 2015-01-30 DIAGNOSIS — IMO0002 Reserved for concepts with insufficient information to code with codable children: Secondary | ICD-10-CM

## 2015-01-30 DIAGNOSIS — E119 Type 2 diabetes mellitus without complications: Secondary | ICD-10-CM

## 2015-01-30 DIAGNOSIS — R3589 Other polyuria: Secondary | ICD-10-CM

## 2015-01-30 DIAGNOSIS — Z23 Encounter for immunization: Secondary | ICD-10-CM

## 2015-01-30 LAB — LIPID PANEL
Cholesterol: 243 mg/dL — ABNORMAL HIGH (ref 0–200)
HDL: 43.7 mg/dL (ref >39.00)
NonHDL: 199.03
Total CHOL/HDL Ratio: 6
Triglycerides: 233 mg/dL — ABNORMAL HIGH (ref 0.0–149.0)
VLDL: 46.6 mg/dL — ABNORMAL HIGH (ref 0.0–40.0)

## 2015-01-30 LAB — HEMOGLOBIN A1C: HEMOGLOBIN A1C: 11.2 % — AB (ref 4.6–6.5)

## 2015-01-30 LAB — POCT URINALYSIS DIPSTICK
BILIRUBIN UA: NEGATIVE
Blood, UA: NEGATIVE
GLUCOSE UA: 500
KETONES UA: 15
Nitrite, UA: NEGATIVE
Protein, UA: NEGATIVE
SPEC GRAV UA: 1.025
Urobilinogen, UA: 0.2
pH, UA: 5

## 2015-01-30 LAB — COMPREHENSIVE METABOLIC PANEL
ALBUMIN: 4.1 g/dL (ref 3.5–5.2)
ALK PHOS: 141 U/L — AB (ref 39–117)
ALT: 66 U/L — ABNORMAL HIGH (ref 0–35)
AST: 44 U/L — ABNORMAL HIGH (ref 0–37)
BUN: 17 mg/dL (ref 6–23)
CALCIUM: 10 mg/dL (ref 8.4–10.5)
CO2: 27 mEq/L (ref 19–32)
Chloride: 98 mEq/L (ref 96–112)
Creatinine, Ser: 0.87 mg/dL (ref 0.40–1.20)
GFR: 71.38 mL/min (ref >60.00)
Glucose, Bld: 340 mg/dL — ABNORMAL HIGH (ref 70–99)
POTASSIUM: 4.4 meq/L (ref 3.5–5.1)
SODIUM: 134 meq/L — AB (ref 135–145)
TOTAL PROTEIN: 7.3 g/dL (ref 6.0–8.3)
Total Bilirubin: 0.6 mg/dL (ref 0.2–1.2)

## 2015-01-30 LAB — MICROALBUMIN / CREATININE URINE RATIO
Creatinine,U: 128.2 mg/dL
MICROALB UR: 1.7 mg/dL (ref 0.0–1.9)
Microalb Creat Ratio: 1.3 mg/g (ref 0.0–30.0)

## 2015-01-30 LAB — LDL CHOLESTEROL, DIRECT: LDL DIRECT: 156 mg/dL

## 2015-01-30 MED ORDER — INSULIN GLARGINE 100 UNIT/ML SOLOSTAR PEN: 15.0000 [IU] | PEN_INJECTOR | Freq: Every day | SUBCUTANEOUS | Status: DC

## 2015-01-30 NOTE — Progress Notes (Signed)
Pre-visit discussion using our clinic review tool. No additional management support is needed unless otherwise documented below in the visit note.  

## 2015-01-30 NOTE — Patient Instructions (Addendum)
I am starting you on Lantus,  A long acting form of insulin to use daily.  Give yourself  15 units once a day,  Continue the metformin 500 mg twice daily.  I Stay on the low carb diet.  Return in 1 week with your blood sugars   Barbara Wade has a new breakfast entree that is low carb:  Frittata    Try the [employer] Flakes cereal available at [facility] on the bottom shelf, with vanilla flavored almond milk  [employer] sells an organic almond mik, non refrigerated,  Called :"orgain"  That is fortified with protein and very low carb

## 2015-01-31 MED ORDER — INSULIN PEN NEEDLE 32G X 4 MM MISC: Status: DC

## 2015-01-31 NOTE — Progress Notes (Signed)
Patient presented for teaching on the self administration of insulin through a pen needle system. Patient verbalized understanding of the injection procces and was able to repeat a demonstration to the nurse. Approximately 20 min spent with patient in teaching.

## 2015-02-01 ENCOUNTER — Encounter: Payer: Self-pay | Admitting: Internal Medicine

## 2015-02-02 NOTE — Assessment & Plan Note (Addendum)
Starting Lantus 15 units daily and metformin 500 mg bid.  Will follow up in one week and add glipizide if BS are <200 ,  Increase Lantus if still > 200. Instruction on administration and use of glucometer were both  Given today ,  Pateint has resumed low glycemic index diet.   Lab Results  Component Value Date   HGBA1C 11.2* 01/30/2015   Lab Results  Component Value Date   MICROALBUR 1.7 01/30/2015

## 2015-02-02 NOTE — Progress Notes (Signed)
Subjective:  Patient ID: Barbara Wade, female    DOB: October 13, 1960  Age: 55 y.o. MRN: [id number]  CC: The primary encounter diagnosis was Encounter for immunization. Diagnoses of Other polyuria, Uncontrolled type 2 diabetes mellitus with stage 2 chronic kidney disease, without long-term current use of insulin ([place]), and Uncontrolled diabetes mellitus type 2 without complications, unspecified long term insulin use status ([place]) were also pertinent to this visit.  HPI Barbara Wade presents for ER follow up.  She presented to the ED on Jan 2 with fatigue and dehydration and was diagnosed with severe hyperglycemia.  She was treated with with IV fluids and discharged on 500 mg  metformin.  She has returned to a low glycemic index diet.   Outpatient Prescriptions Prior to Visit  Medication Sig Dispense Refill  . Cholecalciferol (VITAMIN D3) 1000 UNITS CAPS Take 2 capsules (2,000 Units total) by mouth daily. 60 capsule 11  . dicyclomine (BENTYL) 10 MG capsule Take 1 capsule (10 mg total) by mouth 4 (four) times daily -  before meals and at bedtime. 120 capsule 2  . EPINEPHrine (EPIPEN) 0.3 mg/0.3 mL DEVI Inject 0.3 mLs (0.3 mg total) into the muscle once. 1 Device 6  . fexofenadine (ALLEGRA) 180 MG tablet Take 1 tablet (180 mg total) by mouth daily. 30 tablet 5  . fluticasone (FLONASE) 50 MCG/ACT nasal spray Place 2 sprays into the nose daily.    . hyoscyamine (ANASPAZ) 0.125 MG TBDP Place 0.125 mg under the tongue as needed.      . meclizine (ANTIVERT) 25 MG tablet Take 1 tablet (25 mg total) by mouth 2 (two) times daily as needed. 30 tablet 4  . metFORMIN (GLUCOPHAGE) 500 MG tablet Take 1 tablet (500 mg total) by mouth 2 (two) times daily with a meal. 60 tablet 0  . montelukast (SINGULAIR) 10 MG tablet TAKE 1 TABLET AT BEDTIME 90 tablet 3  . pantoprazole (PROTONIX) 40 MG tablet TAKE 1 TABLET DAILY 90 tablet 2  . Red Yeast Rice Extract ([employer] RED YEAST RICE) 600 MG CAPS Take 1  capsule (600 mg total) by mouth 2 (two) times daily after a meal. 180 capsule 1  . triamterene-hydrochlorothiazide (MAXZIDE-25) 37.5-25 MG tablet TAKE ONE TABLET BY MOUTH ONE TIME DAILY 30 tablet 5   No facility-administered medications prior to visit.    Review of Systems;  Patient denies headache, fevers, malaise, unintentional weight loss, skin rash, eye pain, sinus congestion and sinus pain, sore throat, dysphagia,  hemoptysis , cough, dyspnea, wheezing, chest pain, palpitations, orthopnea, edema, abdominal pain, nausea, melena, diarrhea, constipation, flank pain, dysuria, hematuria, urinary  Frequency, nocturia, numbness, tingling, seizures,  Focal weakness, Loss of consciousness,  Tremor, insomnia, depression, anxiety, and suicidal ideation.      Objective:  BP 112/78 mmHg  Pulse 66  Temp(Src) 97.9 F (36.6 C) (Oral)  Resp 12  Ht 5\' 3"  (1.6 m)  Wt 215 lb 12 oz (97.864 kg)  BMI 38.23 kg/m2  SpO2 94%  BP Readings from Last 3 Encounters:  01/30/15 112/78  01/27/15 132/73  11/19/14 124/78    Wt Readings from Last 3 Encounters:  01/30/15 215 lb 12 oz (97.864 kg)  01/27/15 220 lb (99.791 kg)  11/19/14 218 lb 6 oz (99.054 kg)    General appearance: alert, cooperative and appears stated age Ears: normal TM's and external ear canals both ears Throat: lips, mucosa, and tongue normal; teeth and gums normal Neck: no adenopathy, no carotid bruit, supple, symmetrical,  trachea midline and thyroid not enlarged, symmetric, no tenderness/mass/nodules Back: symmetric, no curvature. ROM normal. No CVA tenderness. Lungs: clear to auscultation bilaterally Heart: regular rate and rhythm, S1, S2 normal, no murmur, click, rub or gallop Abdomen: soft, non-tender; bowel sounds normal; no masses,  no organomegaly Pulses: 2+ and symmetric Skin: Skin color, texture, turgor normal. No rashes or lesions Lymph nodes: Cervical, supraclavicular, and axillary nodes normal.  Lab Results  Component  Value Date   HGBA1C 11.2* 01/30/2015   HGBA1C 5.6 09/07/2013   HGBA1C 5.7 12/20/2012    Lab Results  Component Value Date   CREATININE 0.87 01/30/2015   CREATININE 0.86 01/27/2015   CREATININE 0.84 11/19/2014    Lab Results  Component Value Date   WBC 6.6 01/27/2015   HGB 14.8 01/27/2015   HCT 42.4 01/27/2015   PLT 239 01/27/2015   GLUCOSE 340* 01/30/2015   CHOL 243* 01/30/2015   TRIG 233.0* 01/30/2015   HDL 43.70 01/30/2015   LDLDIRECT 156.0 01/30/2015   LDLCALC 125 10/12/2013   ALT 66* 01/30/2015   AST 44* 01/30/2015   NA 134* 01/30/2015   K 4.4 01/30/2015   CL 98 01/30/2015   CREATININE 0.87 01/30/2015   BUN 17 01/30/2015   CO2 27 01/30/2015   TSH 3.02 10/12/2013   HGBA1C 11.2* 01/30/2015   MICROALBUR 1.7 01/30/2015    Dg Chest 2 View  01/27/2015  CLINICAL DATA:  High blood sugar but not diabetic, cough since Dec 23rd, no other lung/ heart symptoms or surgeries per pt, shielded EXAM: CHEST  2 VIEW COMPARISON:  11/03/2012 FINDINGS: Right hemidiaphragm eventration anteriorly. Midline trachea. Normal heart size. Tortuous thoracic aorta. No pleural effusion or pneumothorax. Clear lungs. IMPRESSION: No acute cardiopulmonary disease. Electronically Signed   By: Abigail Miyamoto M.D.   On: 01/27/2015 17:35    Assessment & Plan:   Problem List Items Addressed This Visit    Uncontrolled diabetes mellitus type 2 without complications ([place])    Starting Lantus 15 units daily and metformin 500 mg bid.  Will follow up in one week and add glipizide if BS are <200 ,  Increase Lantus if still > 200. Instruction on administration and use of glucometer were both  Given today ,  Pateint has resumed low glycemic index diet.   Lab Results  Component Value Date   HGBA1C 11.2* 01/30/2015   Lab Results  Component Value Date   MICROALBUR 1.7 01/30/2015         Relevant Medications   Insulin Glargine (LANTUS SOLOSTAR) 100 UNIT/ML Solostar Pen    Other Visit Diagnoses    Encounter  for immunization    -  Primary    Other polyuria        Relevant Orders    POCT urinalysis dipstick (Completed)    Uncontrolled type 2 diabetes mellitus with stage 2 chronic kidney disease, without long-term current use of insulin ([facility])        Relevant Medications    Insulin Glargine (LANTUS SOLOSTAR) 100 UNIT/ML Solostar Pen     A total of 25 minutes was spent with patient more than half of which was spent in counseling patient on the above mentioned issues , reviewing and explaining recent labs and imaging studies done, and coordination of care.   I am having Barbara Wade start on Insulin Glargine. I am also having her maintain her hyoscyamine, EPINEPHrine, fluticasone, dicyclomine, meclizine, fexofenadine, Red Yeast Rice Extract, Vitamin D3, pantoprazole, triamterene-hydrochlorothiazide, montelukast, and metFORMIN.  Meds  ordered this encounter  Medications  . Insulin Glargine (LANTUS SOLOSTAR) 100 UNIT/ML Solostar Pen    Sig: Inject 15 Units into the skin daily at 10 pm.    Dispense:  5 pen    Refill:  PRN    There are no discontinued medications.  Follow-up: No Follow-up on file.   Crecencio Mc, MD

## 2015-02-14 ENCOUNTER — Ambulatory Visit: Payer: BLUE CROSS/BLUE SHIELD | Admitting: Internal Medicine

## 2015-02-20 ENCOUNTER — Other Ambulatory Visit: Payer: Self-pay

## 2015-02-20 ENCOUNTER — Telehealth: Payer: Self-pay | Admitting: Internal Medicine

## 2015-02-20 NOTE — Telephone Encounter (Signed)
Pt called needing a refill on Insulin Pen Needle (BD PEN NEEDLE NANO U/F) 32G X 4 MM MISC. Pharmacy is [facility] [place]. Call pt @ [phone]. Thank you!

## 2015-02-20 NOTE — Telephone Encounter (Signed)
Rx was filled 01/31/15 #50 pens with 3 refills

## 2015-02-23 ENCOUNTER — Other Ambulatory Visit: Payer: Self-pay | Admitting: Internal Medicine

## 2015-02-26 ENCOUNTER — Encounter: Payer: Self-pay | Admitting: Internal Medicine

## 2015-02-27 MED ORDER — GLIPIZIDE 5 MG PO TABS: 5.0000 mg | ORAL_TABLET | Freq: Two times a day (BID) | ORAL | Status: DC

## 2015-02-28 ENCOUNTER — Encounter: Payer: Self-pay | Admitting: Internal Medicine

## 2015-02-28 ENCOUNTER — Ambulatory Visit (INDEPENDENT_AMBULATORY_CARE_PROVIDER_SITE_OTHER): Payer: BLUE CROSS/BLUE SHIELD | Admitting: Internal Medicine

## 2015-02-28 VITALS — BP 110/78 | HR 73 | Temp 98.2°F | Resp 12 | Ht 63.0 in | Wt 215.5 lb

## 2015-02-28 DIAGNOSIS — R5383 Other fatigue: Secondary | ICD-10-CM | POA: Diagnosis not present

## 2015-02-28 DIAGNOSIS — E1165 Type 2 diabetes mellitus with hyperglycemia: Secondary | ICD-10-CM

## 2015-02-28 DIAGNOSIS — IMO0001 Reserved for inherently not codable concepts without codable children: Secondary | ICD-10-CM

## 2015-02-28 NOTE — Progress Notes (Signed)
Subjective:  Patient ID: Barbara Wade, female    DOB: 1960/11/21  Age: 55 y.o. MRN: [id number]  CC: The primary encounter diagnosis was Uncontrolled type 2 diabetes mellitus without complication, without long-term current use of insulin ([place]). A diagnosis of Other fatigue was also pertinent to this visit.  HPI Barbara Wade presents for one month follow up on new onset  Uncontrolled type 2 DM .  She was seen two weeks ago after an ER visit for dehydration and A1c was over 10.  Insulin and metformin was started.  Her blood sugars improved quickly and all are currently < 200 except on the day seh forgot to take her insulin.  She was instructed to start glipizide and stop insulin. She was given incorrect advice by Triage to stop metformin as well, so she has made no medication changes until she was seen.    Despite significant improvement in polyuria and hyperglycemia , she continues to feel very fatigued .  She denies orthostasis and dyspnea.  She has OSA , is using CPAP but still waking up tired after 9-10 hours of sleep.  Her last cpap titration study was in the spring   She is Exercising 30 minutes of walking daily  Lab Results  Component Value Date   HGBA1C 11.2* 01/30/2015     Outpatient Prescriptions Prior to Visit  Medication Sig Dispense Refill  . Cholecalciferol (VITAMIN D3) 1000 UNITS CAPS Take 2 capsules (2,000 Units total) by mouth daily. 60 capsule 11  . dicyclomine (BENTYL) 10 MG capsule Take 1 capsule (10 mg total) by mouth 4 (four) times daily -  before meals and at bedtime. 120 capsule 2  . EPINEPHrine (EPIPEN) 0.3 mg/0.3 mL DEVI Inject 0.3 mLs (0.3 mg total) into the muscle once. 1 Device 6  . fexofenadine (ALLEGRA) 180 MG tablet Take 1 tablet (180 mg total) by mouth daily. 30 tablet 5  . fluticasone (FLONASE) 50 MCG/ACT nasal spray Place 2 sprays into the nose daily.    . hyoscyamine (ANASPAZ) 0.125 MG TBDP Place 0.125 mg under the tongue as needed.       . Insulin Glargine (LANTUS SOLOSTAR) 100 UNIT/ML Solostar Pen Inject 15 Units into the skin daily at 10 pm. 5 pen PRN  . Insulin Pen Needle (BD PEN NEEDLE NANO U/F) 32G X 4 MM MISC Use to inject prescribed amount of insulin once daily. 50 each 3  . meclizine (ANTIVERT) 25 MG tablet Take 1 tablet (25 mg total) by mouth 2 (two) times daily as needed. 30 tablet 4  . metFORMIN (GLUCOPHAGE) 500 MG tablet Take 1 tablet (500 mg total) by mouth 2 (two) times daily with a meal. 60 tablet 0  . montelukast (SINGULAIR) 10 MG tablet TAKE 1 TABLET AT BEDTIME 90 tablet 3  . pantoprazole (PROTONIX) 40 MG tablet TAKE 1 TABLET DAILY 90 tablet 1  . Red Yeast Rice Extract ([employer]S RED YEAST RICE) 600 MG CAPS Take 1 capsule (600 mg total) by mouth 2 (two) times daily after a meal. 180 capsule 1  . triamterene-hydrochlorothiazide (MAXZIDE-25) 37.5-25 MG tablet TAKE ONE TABLET BY MOUTH ONE TIME DAILY 30 tablet 5  . glipiZIDE (GLUCOTROL) 5 MG tablet Take 1 tablet (5 mg total) by mouth 2 (two) times daily before a meal. (Patient not taking: Reported on 02/28/2015) 60 tablet 3   No facility-administered medications prior to visit.    Review of Systems;  Patient denies headache, fevers, malaise, unintentional weight loss, skin rash,  eye pain, sinus congestion and sinus pain, sore throat, dysphagia,  hemoptysis , cough, dyspnea, wheezing, chest pain, palpitations, orthopnea, edema, abdominal pain, nausea, melena, diarrhea, constipation, flank pain, dysuria, hematuria, urinary  Frequency, nocturia, numbness, tingling, seizures,  Focal weakness, Loss of consciousness,  Tremor, insomnia, depression, anxiety, and suicidal ideation.      Objective:  BP 110/78 mmHg  Pulse 73  Temp(Src) 98.2 F (36.8 C) (Oral)  Resp 12  Ht 5\' 3"  (1.6 m)  Wt 215 lb 8 oz (97.75 kg)  BMI 38.18 kg/m2  SpO2 97%  BP Readings from Last 3 Encounters:  02/28/15 110/78  01/30/15 112/78  01/27/15 132/73    Wt Readings from Last 3 Encounters:    02/28/15 215 lb 8 oz (97.75 kg)  01/30/15 215 lb 12 oz (97.864 kg)  01/27/15 220 lb (99.791 kg)    General appearance: alert, cooperative and appears stated age Ears: normal TM's and external ear canals both ears Throat: lips, mucosa, and tongue normal; teeth and gums normal Neck: no adenopathy, no carotid bruit, supple, symmetrical, trachea midline and thyroid not enlarged, symmetric, no tenderness/mass/nodules Back: symmetric, no curvature. ROM normal. No CVA tenderness. Lungs: clear to auscultation bilaterally Heart: regular rate and rhythm, S1, S2 normal, no murmur, click, rub or gallop Abdomen: soft, non-tender; bowel sounds normal; no masses,  no organomegaly Pulses: 2+ and symmetric Skin: Skin color, texture, turgor normal. No rashes or lesions Lymph nodes: Cervical, supraclavicular, and axillary nodes normal.  Lab Results  Component Value Date   HGBA1C 11.2* 01/30/2015   HGBA1C 5.6 09/07/2013   HGBA1C 5.7 12/20/2012    Lab Results  Component Value Date   CREATININE 0.87 01/30/2015   CREATININE 0.86 01/27/2015   CREATININE 0.84 11/19/2014    Lab Results  Component Value Date   WBC 6.6 01/27/2015   HGB 14.8 01/27/2015   HCT 42.4 01/27/2015   PLT 239 01/27/2015   GLUCOSE 340* 01/30/2015   CHOL 243* 01/30/2015   TRIG 233.0* 01/30/2015   HDL 43.70 01/30/2015   LDLDIRECT 156.0 01/30/2015   LDLCALC 125 10/12/2013   ALT 66* 01/30/2015   AST 44* 01/30/2015   NA 134* 01/30/2015   K 4.4 01/30/2015   CL 98 01/30/2015   CREATININE 0.87 01/30/2015   BUN 17 01/30/2015   CO2 27 01/30/2015   TSH 3.02 10/12/2013   HGBA1C 11.2* 01/30/2015   MICROALBUR 1.7 01/30/2015    Dg Chest 2 View  01/27/2015  CLINICAL DATA:  High blood sugar but not diabetic, cough since Dec 23rd, no other lung/ heart symptoms or surgeries per pt, shielded EXAM: CHEST  2 VIEW COMPARISON:  11/03/2012 FINDINGS: Right hemidiaphragm eventration anteriorly. Midline trachea. Normal heart size. Tortuous  thoracic aorta. No pleural effusion or pneumothorax. Clear lungs. IMPRESSION: No acute cardiopulmonary disease. Electronically Signed   By: Abigail Miyamoto M.D.   On: 01/27/2015 17:35    Assessment & Plan:   Problem List Items Addressed This Visit    Uncontrolled diabetes mellitus type 2 without complications ([place]) - Primary    Improving control with Lantus and metfomrin,  Adding glipizide 5 mg bid and reducing lantus dose to 5 units daily.  Continue metformin .       Fatigue    No obivous source other than deconditioning. Rechecking thyroid ,  Other labs were normal last month            A total of 25 minutes of face to face time was spent with patient more  than half of which was spent in counselling about the above mentioned conditions  and coordination of care   I am having Barbara Wade maintain her hyoscyamine, EPINEPHrine, fluticasone, dicyclomine, meclizine, fexofenadine, Red Yeast Rice Extract, Vitamin D3, triamterene-hydrochlorothiazide, montelukast, metFORMIN, Insulin Glargine, Insulin Pen Needle, pantoprazole, and glipiZIDE.  No orders of the defined types were placed in this encounter.    There are no discontinued medications.  Follow-up: No Follow-up on file.   Crecencio Mc, MD

## 2015-02-28 NOTE — Progress Notes (Signed)
Pre-visit discussion using our clinic review tool. No additional management support is needed unless otherwise documented below in the visit note.  

## 2015-02-28 NOTE — Patient Instructions (Addendum)
Continue metformin.  Start the glipzide at 5 mg twice daily before breakfast and dinner.  Check sugars 2 hours after a meal.  No more fastings   Stop the insulin and resume at 5 units daily if bs are consistently > 180 and send me readings every week  Goal is fasting 80 to 130 ; post prandial 100 to 160   Hypo is < 80  Hyper >180

## 2015-03-02 DIAGNOSIS — R5383 Other fatigue: Secondary | ICD-10-CM | POA: Insufficient documentation

## 2015-03-02 NOTE — Assessment & Plan Note (Signed)
No obivous source other than deconditioning. Rechecking thyroid ,  Other labs were normal last month

## 2015-03-02 NOTE — Assessment & Plan Note (Signed)
Improving control with Lantus and metfomrin,  Adding glipizide 5 mg bid and reducing lantus dose to 5 units daily.  Continue metformin .

## 2015-03-03 ENCOUNTER — Other Ambulatory Visit: Payer: Self-pay

## 2015-03-03 MED ORDER — METFORMIN HCL 500 MG PO TABS: 500.0000 mg | ORAL_TABLET | Freq: Two times a day (BID) | ORAL | Status: DC

## 2015-03-11 ENCOUNTER — Encounter: Payer: Self-pay | Admitting: Internal Medicine

## 2015-03-12 ENCOUNTER — Ambulatory Visit: Payer: BLUE CROSS/BLUE SHIELD | Admitting: Family Medicine

## 2015-03-17 ENCOUNTER — Ambulatory Visit (INDEPENDENT_AMBULATORY_CARE_PROVIDER_SITE_OTHER): Payer: BLUE CROSS/BLUE SHIELD | Admitting: Internal Medicine

## 2015-03-17 ENCOUNTER — Encounter: Payer: Self-pay | Admitting: Internal Medicine

## 2015-03-17 VITALS — BP 114/72 | HR 74 | Temp 98.0°F | Resp 15 | Ht 63.0 in | Wt 217.8 lb

## 2015-03-17 DIAGNOSIS — R1013 Epigastric pain: Secondary | ICD-10-CM | POA: Diagnosis not present

## 2015-03-17 DIAGNOSIS — E1165 Type 2 diabetes mellitus with hyperglycemia: Secondary | ICD-10-CM | POA: Diagnosis not present

## 2015-03-17 DIAGNOSIS — IMO0001 Reserved for inherently not codable concepts without codable children: Secondary | ICD-10-CM

## 2015-03-17 DIAGNOSIS — G8929 Other chronic pain: Secondary | ICD-10-CM

## 2015-03-17 DIAGNOSIS — K449 Diaphragmatic hernia without obstruction or gangrene: Secondary | ICD-10-CM | POA: Diagnosis not present

## 2015-03-17 DIAGNOSIS — E119 Type 2 diabetes mellitus without complications: Secondary | ICD-10-CM

## 2015-03-17 MED ORDER — HYOSCYAMINE SULFATE 0.125 MG PO TBDP
0.1250 mg | ORAL_TABLET | ORAL | Status: DC | PRN
Start: 2015-03-17 — End: 2017-01-19

## 2015-03-17 NOTE — Progress Notes (Signed)
Pre visit review using our clinic review tool, if applicable. No additional management support is needed unless otherwise documented below in the visit note. 

## 2015-03-17 NOTE — Progress Notes (Signed)
Subjective:  Patient ID: Barbara Wade, female    DOB: [birth year]  Age: 55 y.o. MRN: [id number]  CC: The primary encounter diagnosis was Abdominal pain, chronic, epigastric. Diagnoses of Diabetes mellitus without complication ([place]), Uncontrolled type 2 diabetes mellitus without complication, without long-term current use of insulin ([place]), Sliding hiatal hernia, and Postprandial epigastric pain were also pertinent to this visit.  HPI Barbara Wade presents for evaluation of persistent epigastric   Pain initially improved with use of daily protonix,but has become more bothersome. The pain is intermittent , and relieved by nothing,  Occasionally has a hiccup like burp,.  Denies  nausea .  at times is really painful  And lasts several hours. At times wakes her up. Occurring now on a dialy basis   Has not tried the hyoscyamine .  The bentyl did not help ,  Taking protonix daily.    DM type 2:  Glipizide 5 mg bid and metformin   Bid .  Has not taken lantus  In several weeks,.  folllowing an extremely low carbohydrate diet,  Limiting daily carbs to 30/day.  NO weight loss   Outpatient Prescriptions Prior to Visit  Medication Sig Dispense Refill  . Cholecalciferol (VITAMIN D3) 1000 UNITS CAPS Take 2 capsules (2,000 Units total) by mouth daily. 60 capsule 11  . dicyclomine (BENTYL) 10 MG capsule Take 1 capsule (10 mg total) by mouth 4 (four) times daily -  before meals and at bedtime. 120 capsule 2  . EPINEPHrine (EPIPEN) 0.3 mg/0.3 mL DEVI Inject 0.3 mLs (0.3 mg total) into the muscle once. 1 Device 6  . fexofenadine (ALLEGRA) 180 MG tablet Take 1 tablet (180 mg total) by mouth daily. 30 tablet 5  . fluticasone (FLONASE) 50 MCG/ACT nasal spray Place 2 sprays into the nose daily.    Marland Kitchen glipiZIDE (GLUCOTROL) 5 MG tablet Take 1 tablet (5 mg total) by mouth 2 (two) times daily before a meal. 60 tablet 3  . hyoscyamine (ANASPAZ) 0.125 MG TBDP Place 0.125 mg under the tongue as needed.       . Insulin Glargine (LANTUS SOLOSTAR) 100 UNIT/ML Solostar Pen Inject 15 Units into the skin daily at 10 pm. 5 pen PRN  . Insulin Pen Needle (BD PEN NEEDLE NANO U/F) 32G X 4 MM MISC Use to inject prescribed amount of insulin once daily. 50 each 3  . meclizine (ANTIVERT) 25 MG tablet Take 1 tablet (25 mg total) by mouth 2 (two) times daily as needed. 30 tablet 4  . metFORMIN (GLUCOPHAGE) 500 MG tablet Take 1 tablet (500 mg total) by mouth 2 (two) times daily with a meal. 60 tablet 11  . montelukast (SINGULAIR) 10 MG tablet TAKE 1 TABLET AT BEDTIME 90 tablet 3  . pantoprazole (PROTONIX) 40 MG tablet TAKE 1 TABLET DAILY 90 tablet 1  . Red Yeast Rice Extract ([employer]S RED YEAST RICE) 600 MG CAPS Take 1 capsule (600 mg total) by mouth 2 (two) times daily after a meal. 180 capsule 1  . triamterene-hydrochlorothiazide (MAXZIDE-25) 37.5-25 MG tablet TAKE ONE TABLET BY MOUTH ONE TIME DAILY 30 tablet 5   No facility-administered medications prior to visit.    Review of Systems;  Patient denies headache, fevers, malaise, unintentional weight loss, skin rash, eye pain, sinus congestion and sinus pain, sore throat, dysphagia,  hemoptysis , cough, dyspnea, wheezing, chest pain, palpitations, orthopnea, edema, abdominal pain, nausea, melena, diarrhea, constipation, flank pain, dysuria, hematuria, urinary  Frequency, nocturia, numbness, tingling, seizures,  Focal weakness, Loss of consciousness,  Tremor, insomnia, depression, anxiety, and suicidal ideation.      Objective:  BP 114/72 mmHg  Pulse 74  Temp(Src) 98 F (36.7 C) (Oral)  Resp 15  Ht 5\' 3"  (1.6 m)  Wt 217 lb 12.8 oz (98.793 kg)  BMI 38.59 kg/m2  SpO2 95%  BP Readings from Last 3 Encounters:  03/17/15 114/72  02/28/15 110/78  01/30/15 112/78    Wt Readings from Last 3 Encounters:  03/17/15 217 lb 12.8 oz (98.793 kg)  02/28/15 215 lb 8 oz (97.75 kg)  01/30/15 215 lb 12 oz (97.864 kg)    General appearance: alert, cooperative and appears  stated age Ears: normal TM's and external ear canals both ears Throat: lips, mucosa, and tongue normal; teeth and gums normal Neck: no adenopathy, no carotid bruit, supple, symmetrical, trachea midline and thyroid not enlarged, symmetric, no tenderness/mass/nodules Back: symmetric, no curvature. ROM normal. No CVA tenderness. Lungs: clear to auscultation bilaterally Heart: regular rate and rhythm, S1, S2 normal, no murmur, click, rub or gallop Abdomen: soft, non-tender; bowel sounds normal; no masses,  no organomegaly Pulses: 2+ and symmetric Skin: Skin color, texture, turgor normal. No rashes or lesions Lymph nodes: Cervical, supraclavicular, and axillary nodes normal.  Lab Results  Component Value Date   HGBA1C 11.2* 01/30/2015   HGBA1C 5.6 09/07/2013   HGBA1C 5.7 12/20/2012    Lab Results  Component Value Date   CREATININE 0.87 01/30/2015   CREATININE 0.86 01/27/2015   CREATININE 0.84 11/19/2014    Lab Results  Component Value Date   WBC 6.6 01/27/2015   HGB 14.8 01/27/2015   HCT 42.4 01/27/2015   PLT 239 01/27/2015   GLUCOSE 340* 01/30/2015   CHOL 243* 01/30/2015   TRIG 233.0* 01/30/2015   HDL 43.70 01/30/2015   LDLDIRECT 156.0 01/30/2015   LDLCALC 125 10/12/2013   ALT 66* 01/30/2015   AST 44* 01/30/2015   NA 134* 01/30/2015   K 4.4 01/30/2015   CL 98 01/30/2015   CREATININE 0.87 01/30/2015   BUN 17 01/30/2015   CO2 27 01/30/2015   TSH 3.02 10/12/2013   HGBA1C 11.2* 01/30/2015   MICROALBUR 1.7 01/30/2015    Dg Chest 2 View  01/27/2015  CLINICAL DATA:  High blood sugar but not diabetic, cough since Dec 23rd, no other lung/ heart symptoms or surgeries per pt, shielded EXAM: CHEST  2 VIEW COMPARISON:  11/03/2012 FINDINGS: Right hemidiaphragm eventration anteriorly. Midline trachea. Normal heart size. Tortuous thoracic aorta. No pleural effusion or pneumothorax. Clear lungs. IMPRESSION: No acute cardiopulmonary disease. Electronically Signed   By: Abigail Miyamoto M.D.    On: 01/27/2015 17:35    Assessment & Plan:   Problem List Items Addressed This Visit    Uncontrolled diabetes mellitus type 2 without complications ([place])    Improving control with glipizide and metfomrin, no longer on Lantus.  Diet discussed, referral to Diabetes education requested.      Postprandial epigastric pain      PPI resumed, and advised to eat smaller meals.  Prn mylanta , hyoscyamine.          Sliding hiatal hernia    May be the etiology of her abdominal pain. Marland Kitchen REFERRAL TO gI        Other Visit Diagnoses    Abdominal pain, chronic, epigastric    -  Primary    Relevant Orders    Ambulatory referral to Gastroenterology    Diabetes mellitus without complication ([place])  Relevant Orders    Referral to Nutrition and Diabetes Services      A total of 25 minutes of face to face time was spent with patient more than half of which was spent in counselling and coordination of care `  I am having Barbara Wade start on hyoscyamine. I am also having her maintain her hyoscyamine, EPINEPHrine, fluticasone, dicyclomine, meclizine, fexofenadine, Red Yeast Rice Extract, Vitamin D3, triamterene-hydrochlorothiazide, montelukast, Insulin Glargine, Insulin Pen Needle, pantoprazole, glipiZIDE, and metFORMIN.  Meds ordered this encounter  Medications  . hyoscyamine (ANASPAZ) 0.125 MG TBDP disintergrating tablet    Sig: Place 1 tablet (0.125 mg total) under the tongue every 4 (four) hours as needed for cramping.    Dispense:  60 tablet    Refill:  0    There are no discontinued medications.  Follow-up: No Follow-up on file.   Crecencio Mc, MD

## 2015-03-17 NOTE — Patient Instructions (Signed)
Your abdominal pain may be coming from your hiatal hernia,  A gastric ulcer,  Or esophageal spasm   Continue taking pantoprazole  Please try dissolving the hysocyamine under your tongue the next time you feel a "ball" in your upper abdomen  You can also try using Mylanta or Gaviscon for the "burning /gnawing  Sensation   I am referrring you to [facility] for an endoscopy   Referral to [facility] for diabetic nutrition class ,  Or to [facility]'s classes if the [facility] is not covered bvy your insurance

## 2015-03-18 NOTE — Assessment & Plan Note (Addendum)
May be the etiology of her abdominal pain. Marland Kitchen REFERRAL TO gI

## 2015-03-18 NOTE — Assessment & Plan Note (Signed)
Improving control with glipizide and metfomrin, no longer on Lantus.  Diet discussed, referral to Diabetes education requested.

## 2015-03-18 NOTE — Assessment & Plan Note (Addendum)
PPI resumed, and advised to eat smaller meals.  Prn mylanta , hyoscyamine.

## 2015-03-19 ENCOUNTER — Encounter: Payer: Self-pay | Admitting: Gastroenterology

## 2015-03-27 ENCOUNTER — Encounter: Payer: BLUE CROSS/BLUE SHIELD | Attending: Internal Medicine | Admitting: *Deleted

## 2015-03-27 ENCOUNTER — Encounter: Payer: Self-pay | Admitting: *Deleted

## 2015-03-27 VITALS — BP 110/62 | Ht 63.0 in | Wt 217.1 lb

## 2015-03-27 DIAGNOSIS — E119 Type 2 diabetes mellitus without complications: Secondary | ICD-10-CM

## 2015-03-27 NOTE — Patient Instructions (Addendum)
Check blood sugars 2 hrs after supper every day as directed by MD Exercise: Continue walking  for  30-45  minutes 5 days a week Eat 3 meals day,  2-3 snacks a day Space meals 4-6 hours apart Make an eye doctor appointment Bring blood sugar records to the next class

## 2015-03-27 NOTE — Progress Notes (Signed)
Diabetes Self-Management Education  Visit Type: First/Initial  Appt. Start Time: 1510 Appt. End Time: [id number]  03/27/2015  Ms. Barbara Wade, identified by name and date of birth, is a 55 y.o. female with a diagnosis of Diabetes: Type 2.   ASSESSMENT  Blood pressure 110/62, height 5\' 3"  (1.6 m), weight 217 lb 1.6 oz (98.476 kg). Body mass index is 38.47 kg/(m^2).      Diabetes Self-Management Education - 03/27/15 1711    Visit Information   Visit Type First/Initial   Initial Visit   Diabetes Type Type 2   Are you currently following a meal plan? Yes   What type of meal plan do you follow? low carb   Are you taking your medications as prescribed? Yes   Date Diagnosed 2 months ago - Jan 2017   Health Coping   How would you rate your overall health? Good   Psychosocial Assessment   Patient Belief/Attitude about Diabetes Motivated to manage diabetes  "stressed and anxious"   Self-care barriers None   Self-management support Doctor's office   Patient Concerns Nutrition/Meal planning;Glycemic Control;Weight Control;Healthy Lifestyle   Special Needs None   Preferred Learning Style Visual   Learning Readiness Change in progress   How often do you need to have someone help you when you read instructions, pamphlets, or other written materials from your doctor or pharmacy? 1 - Never   What is the last grade level you completed in school? BA   Complications   Last HgB A1C per patient/outside source 11.2 %  01/30/15   How often do you check your blood sugar? 1-2 times/day   Postprandial Blood glucose range (mg/dL) 70-129;130-179;>200  pp's 112-149 mg/dL with 2 readings of 220 and 207 mg/dL related to food choices   Have you had a dilated eye exam in the past 12 months? No   Have you had a dental exam in the past 12 months? No   Are you checking your feet? No   Dietary Intake   Breakfast greek yogurt; flakes, eggs   Snack (morning) nuts   Lunch ham on sugar free bread or pita    Snack (afternoon) Akins bar; apple and nuts   Dinner chicken, steak or fish; salad, brussel sprouts, asparagus, green beans   Snack (evening) cheese and sausage, fruit    Beverage(s) water, unsweetened tea   Exercise   Exercise Type Light (walking / raking leaves)   How many days per week to you exercise? 5   How many minutes per day do you exercise? 40   Total minutes per week of exercise 200   Patient Education   Previous Diabetes Education No   Disease state  Definition of diabetes, type 1 and 2, and the diagnosis of diabetes   Nutrition management  Food label reading, portion sizes and measuring food.;Carbohydrate counting;Reviewed blood glucose goals for pre and post meals and how to evaluate the patients' food intake on their blood glucose level.   Physical activity and exercise  Role of exercise on diabetes management, blood pressure control and cardiac health.   Medications Reviewed patients medication for diabetes, action, purpose, timing of dose and side effects.   Monitoring Purpose and frequency of SMBG.;Identified appropriate SMBG and/or A1C goals.   Chronic complications Relationship between chronic complications and blood glucose control   Psychosocial adjustment Identified and addressed patients feelings and concerns about diabetes;Role of stress on diabetes   Individualized Goals (developed by patient)   Reducing Risk Improve blood  sugars Lose weight Lead a healthier lifestyle Become more fit   Outcomes   Expected Outcomes Demonstrated interest in learning. Expect positive outcomes      Individualized Plan for Diabetes Self-Management Training:   Learning Objective:  Patient will have a greater understanding of diabetes self-management. Patient education plan is to attend individual and/or group sessions per assessed needs and concerns.   Plan:   Patient Instructions  Check blood sugars 2 hrs after supper every day as directed by MD Exercise: Continue walking   for  30-45  minutes 5 days a week Eat 3 meals day,  2-3 snacks a day Space meals 4-6 hours apart Make an eye doctor appointment Bring blood sugar records to the next class  Expected Outcomes:  Demonstrated interest in learning. Expect positive outcomes  Education material provided:  General Meal Planning Guidelines Simple Meal Plan  If problems or questions, patient to contact team via:   Johny Drilling, [place], [place], CDE [phone]  Future DSME appointment: Monday April 07, 2015 for Class 1

## 2015-03-30 ENCOUNTER — Encounter: Payer: Self-pay | Admitting: Internal Medicine

## 2015-04-07 ENCOUNTER — Encounter: Payer: Self-pay | Admitting: Dietician

## 2015-04-07 ENCOUNTER — Encounter: Payer: BLUE CROSS/BLUE SHIELD | Admitting: Dietician

## 2015-04-07 VITALS — Ht 63.0 in | Wt 218.4 lb

## 2015-04-07 DIAGNOSIS — E119 Type 2 diabetes mellitus without complications: Secondary | ICD-10-CM | POA: Diagnosis not present

## 2015-04-07 NOTE — Progress Notes (Signed)

## 2015-04-08 ENCOUNTER — Encounter: Payer: Self-pay | Admitting: Internal Medicine

## 2015-04-10 ENCOUNTER — Other Ambulatory Visit: Payer: Self-pay | Admitting: Internal Medicine

## 2015-04-10 MED ORDER — METFORMIN HCL 500 MG PO TABS: 1000.0000 mg | ORAL_TABLET | Freq: Two times a day (BID) | ORAL | Status: DC

## 2015-04-10 NOTE — Telephone Encounter (Signed)
[facility] was calling about clarification on pt's metformin RX they are requesting a thirty day supply for #120 tabs. Please advise, thanks

## 2015-04-14 ENCOUNTER — Encounter: Payer: Self-pay | Admitting: Dietician

## 2015-04-14 ENCOUNTER — Encounter: Payer: BLUE CROSS/BLUE SHIELD | Admitting: Dietician

## 2015-04-14 VITALS — Wt 216.0 lb

## 2015-04-14 DIAGNOSIS — E119 Type 2 diabetes mellitus without complications: Secondary | ICD-10-CM | POA: Diagnosis not present

## 2015-04-14 NOTE — Progress Notes (Signed)

## 2015-04-21 ENCOUNTER — Encounter: Payer: BLUE CROSS/BLUE SHIELD | Admitting: Dietician

## 2015-04-21 ENCOUNTER — Encounter: Payer: Self-pay | Admitting: Dietician

## 2015-04-21 VITALS — Ht 65.0 in | Wt 219.8 lb

## 2015-04-21 DIAGNOSIS — E119 Type 2 diabetes mellitus without complications: Secondary | ICD-10-CM | POA: Diagnosis not present

## 2015-04-21 NOTE — Progress Notes (Signed)

## 2015-04-24 ENCOUNTER — Encounter: Payer: Self-pay | Admitting: *Deleted

## 2015-04-28 ENCOUNTER — Encounter: Payer: Self-pay | Admitting: Internal Medicine

## 2015-05-09 IMAGING — CR DG CHEST 2V
1 series · 2 of 2 positions shown · non-contrast
Comparison: none

REASON FOR EXAM: chest wall pain
COMMENTS:

PROCEDURE:     DXR - DXR CHEST PA (OR AP) AND LATERAL  - November 03, 2012 [DATE]
RESULT:     The lungs are clear. The heart and pulmonary vessels are normal.
The bony and mediastinal structures are unremarkable. There is no effusion.
There is no pneumothorax or evidence of congestive failure.

[Series 1: w chest pa · 0.14mm/px · 2 of 2 slices shown]
[im 1/2]
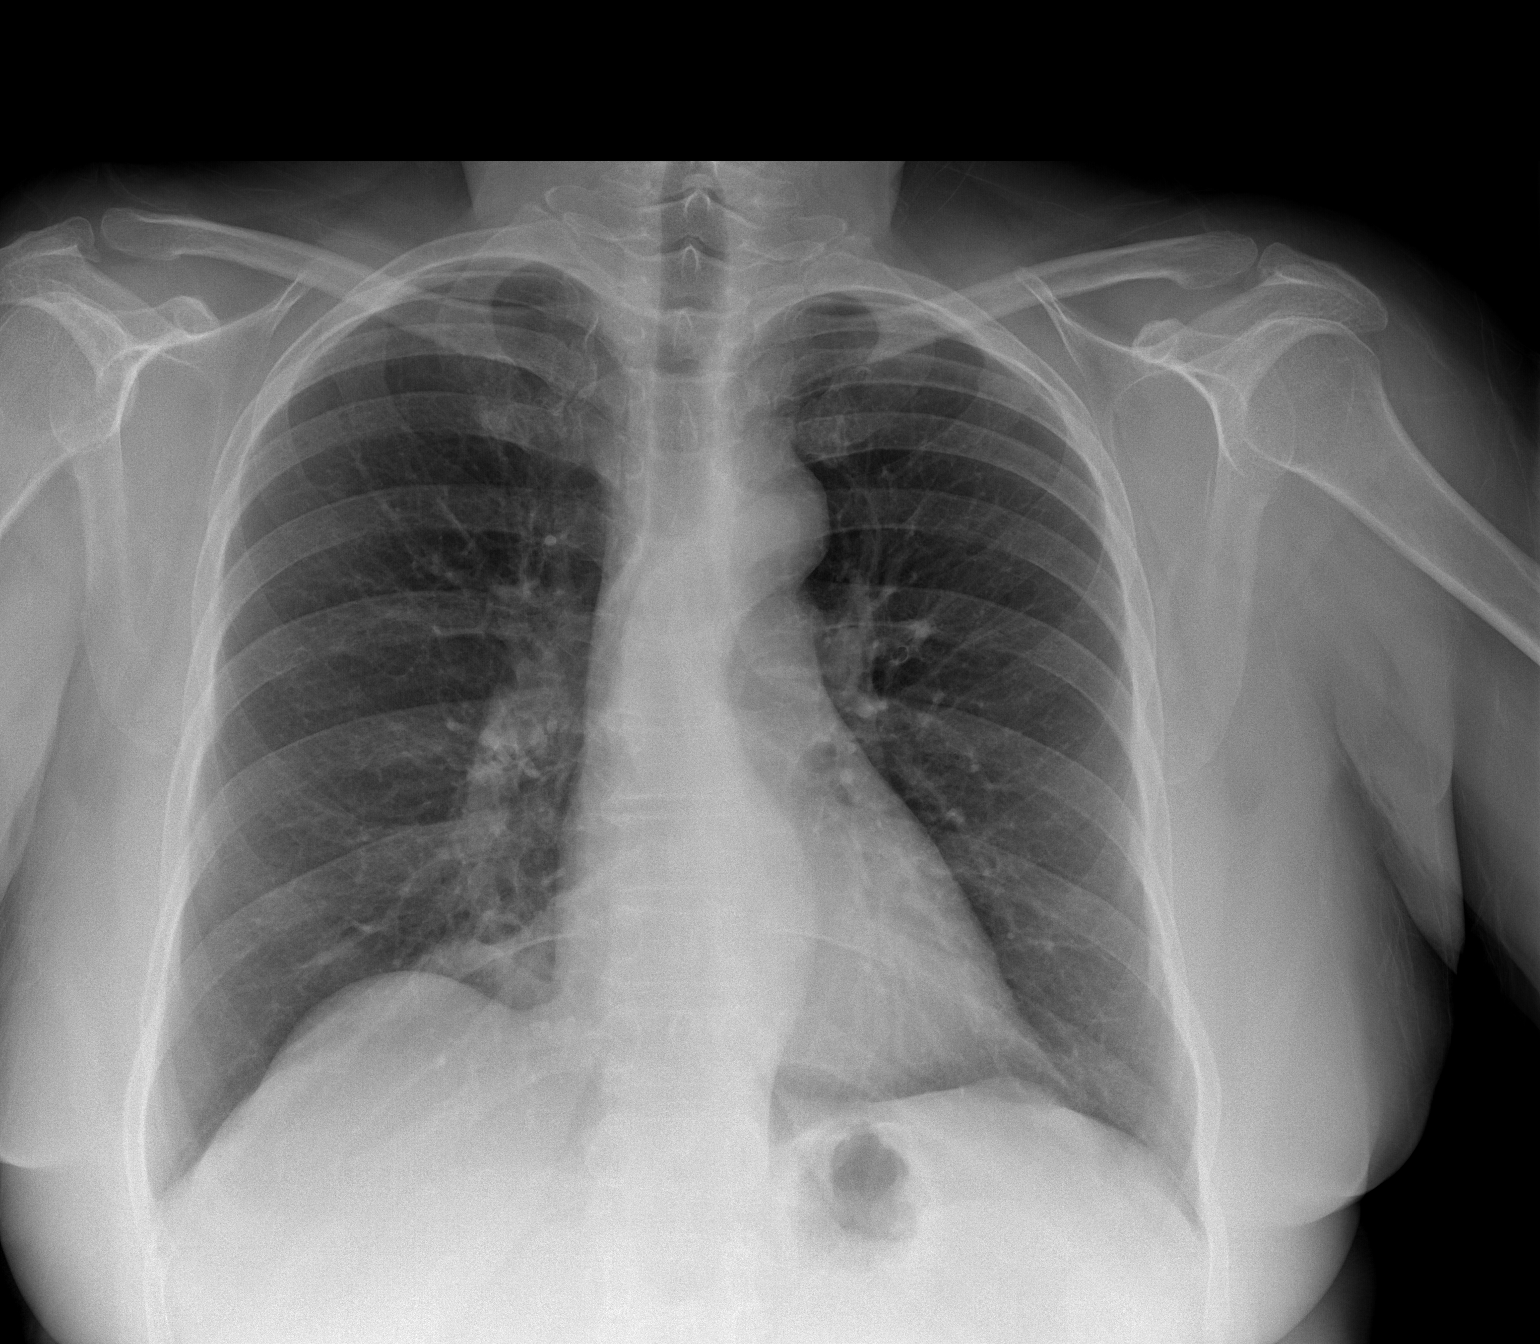
[im 2/2]
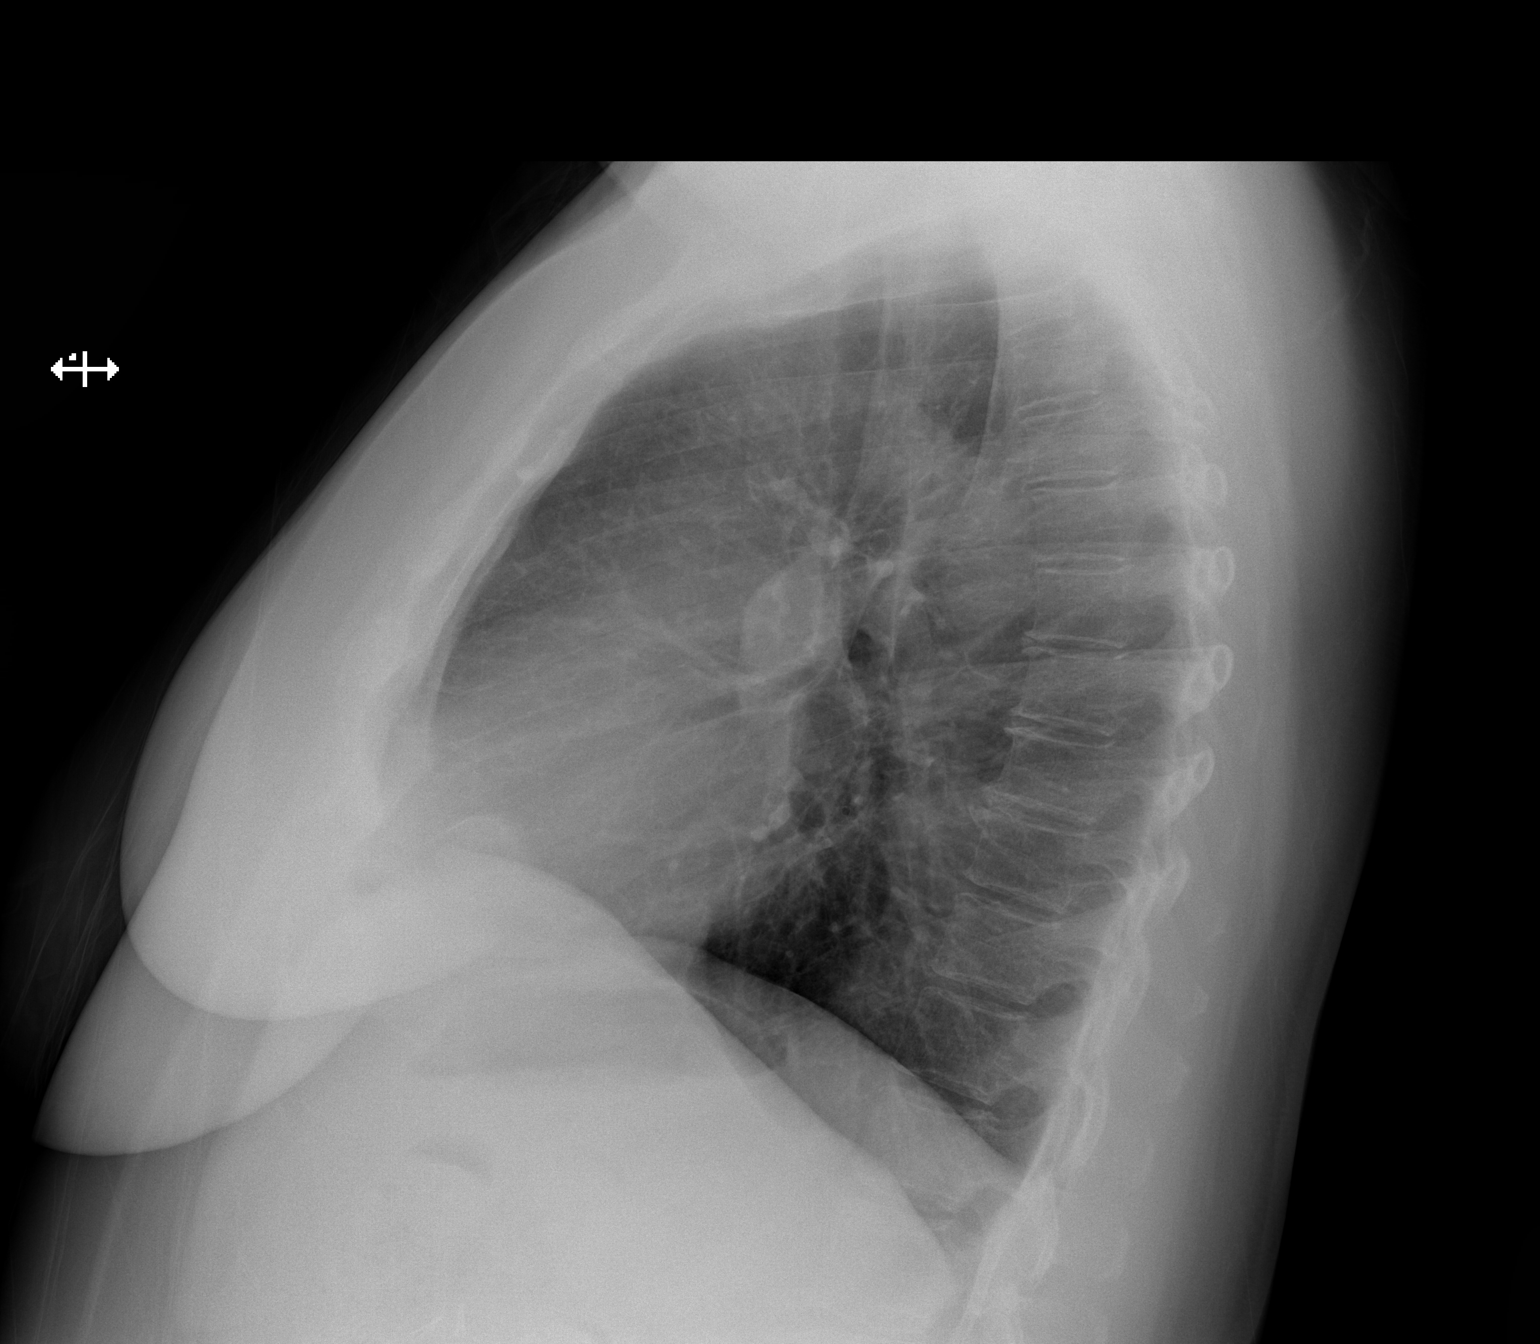

[2 of 2 positions shown; findings below may reference images not displayed]

IMPRESSION: No acute cardiopulmonary disease.

[REDACTED]

## 2015-05-14 ENCOUNTER — Ambulatory Visit: Payer: BLUE CROSS/BLUE SHIELD | Admitting: Gastroenterology

## 2015-05-27 ENCOUNTER — Other Ambulatory Visit: Payer: Self-pay | Admitting: Internal Medicine

## 2015-06-10 ENCOUNTER — Other Ambulatory Visit: Payer: Self-pay | Admitting: Internal Medicine

## 2015-06-30 ENCOUNTER — Other Ambulatory Visit: Payer: Self-pay

## 2015-06-30 ENCOUNTER — Ambulatory Visit (INDEPENDENT_AMBULATORY_CARE_PROVIDER_SITE_OTHER): Payer: BLUE CROSS/BLUE SHIELD | Admitting: Gastroenterology

## 2015-06-30 ENCOUNTER — Encounter: Payer: Self-pay | Admitting: Gastroenterology

## 2015-06-30 VITALS — BP 116/72 | HR 64 | Ht 63.5 in | Wt 215.4 lb

## 2015-06-30 DIAGNOSIS — R1013 Epigastric pain: Secondary | ICD-10-CM | POA: Diagnosis not present

## 2015-06-30 DIAGNOSIS — G8929 Other chronic pain: Secondary | ICD-10-CM | POA: Diagnosis not present

## 2015-06-30 NOTE — Patient Instructions (Addendum)
  Start Aleve one pill twice daily (2 weeks). Stay on Protonix once daily for now.  You have been scheduled for an endoscopy. Please follow written instructions given to you at your visit today. If you use inhalers (even only as needed), please bring them with you on the day of your procedure. Your physician has requested that you go to www.startemmi.com and enter the access code given to you at your visit today. This web site gives a general overview about your procedure. However, you should still follow specific instructions given to you by our office regarding your preparation for the procedure.

## 2015-06-30 NOTE — Progress Notes (Signed)
HPI: This is a    very pleasant 55 year old woman    who was referred to me by Crecencio Mc, MD  to evaluate  chronic epigastric pain .    Chief complaint is chronic epigastric pain  SUB STERnal pains for over a year.  Pain is constant.  Feels like something is there, a fist being shoved into the belly. Eating and moving bowels do not change it.    She tried an antispasm meds.  Take PPI protonix for about year.  It has not helped.  Takes in AM shortly before breakfast.  No NSAIDs routinely.  Overall weighyt down 6 pounds (joined WW)_  No nausea, vomiting.  She's never had EGD that she can recall.  She had small bowel capsule remotely.  Laying on her stomach hurts at the site.  She is usually constipated.  After eating hiccups.   Colonoscopy October 2011, [facility] system. Done for "melena and personal history of polyps". Findings 5 subcentimeter polyps, left-sided diverticulosis. Pathology results were not available at the time of this visit.  That report shows a recommendation for repeat colonoscopy 2 years later. I do not have the results of that 2 year recall colonoscopy.   CT scan 10/2014 done for LLQ pains:  1. No evidence of acute inflammatory process within abdomen or pelvis. 2. Significant fatty infiltration of the liver. Status postcholecystectomy. 3. Moderate stool throughout the colon. No pericecal inflammation. No colitis. Normal appendix. No pericecal inflammation. 4. Sigmoid colon diverticula. No evidence of acute diverticulitis. 5. Status post hysterectomy.6. No hydronephrosis or hydroureter. Right renal cyst measures 1.8 cm.  10/2013 UGI with SBFT: done for epigastric fullness: Small HH, otherwise normal.   Review of systems: Pertinent positive and negative review of systems were noted in the above HPI section. Complete review of systems was performed and was otherwise normal.   Past Medical History  Diagnosis Date  . Allergy   . Sleep apnea   . Anaphylaxis  2007    occurred during allergy testing to environmental allergen  . Neuromuscular disorder ([place])   . Fibromyalgia 2004  . Depression     managed currently with [facility]  . Hepatitis B virus infection   . Irritable bowel syndrome (IBS)   . Diabetes mellitus without complication ([place])   . Meniere's disease   . Chronic headaches   . Colon polyps   . Diverticulosis   . Gallstones     Past Surgical History  Procedure Laterality Date  . Knee arthroscopy Left 2004  . Trigger finger release Bilateral   . Cholecystectomy      elective  . Abdominal hysterectomy  2000    for fibroids  . Pilonidal cyst excision  1983    Current Outpatient Prescriptions  Medication Sig Dispense Refill  . chlorpheniramine (CHLOR-TRIMETON) 4 MG tablet Take 4 mg by mouth daily.    . [employer] D3 1000 units capsule TAKE 2 CAPSULES BY MOUTH DAILY 120 capsule 1  . dicyclomine (BENTYL) 10 MG capsule Take 1 capsule (10 mg total) by mouth 4 (four) times daily -  before meals and at bedtime. 120 capsule 2  . EPINEPHrine (EPIPEN) 0.3 mg/0.3 mL DEVI Inject 0.3 mLs (0.3 mg total) into the muscle once. 1 Device 6  . fexofenadine (ALLEGRA) 180 MG tablet Take 1 tablet (180 mg total) by mouth daily. (Patient taking differently: Take 180 mg by mouth daily as needed. ) 30 tablet 5  . fluticasone (FLONASE) 50 MCG/ACT nasal spray Place  2 sprays into the nose daily as needed.     Marland Kitchen glipiZIDE (GLUCOTROL) 5 MG tablet TAKE 1 TABLET BY MOUTH TWICE DAILY BEFORE A MEAL 60 tablet 5  . hyoscyamine (ANASPAZ) 0.125 MG TBDP disintergrating tablet Place 1 tablet (0.125 mg total) under the tongue every 4 (four) hours as needed for cramping. 60 tablet 0  . meclizine (ANTIVERT) 25 MG tablet Take 1 tablet (25 mg total) by mouth 2 (two) times daily as needed. 30 tablet 4  . metFORMIN (GLUCOPHAGE) 500 MG tablet Take 2 tablets (1,000 mg total) by mouth 2 (two) times daily with a meal. 60 tablet 11  . montelukast (SINGULAIR) 10 MG tablet TAKE 1  TABLET AT BEDTIME 90 tablet 3  . pantoprazole (PROTONIX) 40 MG tablet TAKE 1 TABLET DAILY 90 tablet 1  . Red Yeast Rice Extract (CVS RED YEAST RICE) 600 MG CAPS Take 1 capsule (600 mg total) by mouth 2 (two) times daily after a meal. 180 capsule 1  . saccharomyces boulardii (FLORASTOR) 250 MG capsule Take 250 mg by mouth daily.    Marland Kitchen triamterene-hydrochlorothiazide (MAXZIDE-25) 37.5-25 MG tablet TAKE ONE TABLET BY MOUTH ONE TIME DAILY 30 tablet 5   No current facility-administered medications for this visit.    Allergies as of 06/30/2015 - Review Complete 06/30/2015  Allergen Reaction Noted  . Hydrocodone Hives 10/16/2010    Family History  Problem Relation Age of Onset  . Heart disease Maternal Aunt   . Heart disease Maternal Uncle   . Heart disease Maternal Grandmother   . Stroke Maternal Grandmother   . Diabetes Father   . Colon cancer Paternal Grandmother   . Colon polyps Father   . Colon polyps Paternal Uncle     Social History   Social History  . Marital Status: Married    Spouse Name: Vania Rea   . Number of Children: 2  . Years of Education: 16 yrs    Occupational History  . 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 Smoking status: Former Smoker -- 1.00 packs/day for 20 years    Types: Cigarettes    Quit date: 10/16/2002  . Smokeless tobacco: Never Used  . Alcohol Use: 0.0 - 0.6 oz/week    0-1 Standard drinks or equivalent per week     Comment: occasional  . Drug Use: No  . Sexual Activity: Yes   Other Topics Concern  . Not on file   Social History Narrative     Physical Exam: Ht 5' 3.5" (1.613 m)  Wt 215 lb 6 oz (97.693 kg)  BMI 37.55 kg/m2 Constitutional: generally well-appearing Psychiatric: alert and oriented x3 Eyes: extraocular movements intact Mouth: oral pharynx moist, no lesions Neck: supple no lymphadenopathy Cardiovascular: heart regular rate and rhythm Lungs: clear to auscultation  bilaterally Abdomen: soft, nontender, nondistended, no obvious ascites, no peritoneal signs, normal bowel sounds Extremities: no lower extremity edema bilaterally Skin: no lesions on visible extremities   Assessment and plan: 55 y.o. female with  Chronic substernal, epigastric pain  She is slightly tender the xiphoid process and this may be the discomfort about which she is bothered. Her symptoms are not related to eating, moving her bowels, any physicians. She has been on proton pump inhibitor for a year for this discomfort and has not made any difference. She gets no heartburn. I recommended EGD to check for underlying gastritis, H. pylori, peptic ulcer disease, neoplasm which I  think is unlikely area I also recommended she try twice daily Aleve as this might be related to musculoskeletal inflammation. She will do this for 2 weeks. She will stay on proton X in the meantime even though I was considering removing it since it has not made any improvement in her epigastric pains over the year that she has been taking it.   Owens Loffler, MD [facility] 06/30/2015, 8:30 AM  Cc: Crecencio Mc, MD

## 2015-08-08 ENCOUNTER — Encounter: Payer: Self-pay | Admitting: Gastroenterology

## 2015-08-08 ENCOUNTER — Ambulatory Visit (AMBULATORY_SURGERY_CENTER): Payer: BLUE CROSS/BLUE SHIELD | Admitting: Gastroenterology

## 2015-08-08 VITALS — BP 117/72 | HR 77 | Temp 97.1°F | Resp 11 | Ht 63.0 in | Wt 215.0 lb

## 2015-08-08 DIAGNOSIS — K2970 Gastritis, unspecified, without bleeding: Secondary | ICD-10-CM

## 2015-08-08 DIAGNOSIS — R1013 Epigastric pain: Secondary | ICD-10-CM

## 2015-08-08 DIAGNOSIS — K3189 Other diseases of stomach and duodenum: Secondary | ICD-10-CM | POA: Diagnosis not present

## 2015-08-08 MED ORDER — SODIUM CHLORIDE 0.9 % IV SOLN: 500.0000 mL | INTRAVENOUS | Status: DC

## 2015-08-08 NOTE — Progress Notes (Signed)
Called to room to assist during endoscopic procedure.  Patient ID and intended procedure confirmed with present staff. Received instructions for my participation in the procedure from the performing physician.  

## 2015-08-08 NOTE — Patient Instructions (Signed)
YOU HAD AN ENDOSCOPIC PROCEDURE TODAY AT [facility]:   Refer to the procedure report that was given to you for any specific questions about what was found during the examination.  If the procedure report does not answer your questions, please call your gastroenterologist to clarify.  If you requested that your care partner not be given the details of your procedure findings, then the procedure report has been included in a sealed envelope for you to review at your convenience later.  YOU SHOULD EXPECT: Some feelings of bloating in the abdomen. Passage of more gas than usual.  Walking can help get rid of the air that was put into your GI tract during the procedure and reduce the bloating. If you had a lower endoscopy (such as a colonoscopy or flexible sigmoidoscopy) you may notice spotting of blood in your stool or on the toilet paper. If you underwent a bowel prep for your procedure, you may not have a normal bowel movement for a few days.  Please Note:  You might notice some irritation and congestion in your nose or some drainage.  This is from the oxygen used during your procedure.  There is no need for concern and it should clear up in a day or so.  SYMPTOMS TO REPORT IMMEDIATELY:   Following upper endoscopy (EGD)  Vomiting of blood or coffee ground material  New chest pain or pain under the shoulder blades  Painful or persistently difficult swallowing  New shortness of breath  Fever of 100F or higher  Black, tarry-looking stools  For urgent or emergent issues, a gastroenterologist can be reached at any hour by calling [phone].   DIET: Your first meal following the procedure should be a small meal and then it is ok to progress to your normal diet. Heavy or fried foods are harder to digest and may make you feel nauseous or bloated.  Likewise, meals heavy in dairy and vegetables can increase bloating.  Drink plenty of fluids but you should avoid alcoholic beverages for  24 hours.  ACTIVITY:  You should plan to take it easy for the rest of today and you should NOT DRIVE or use heavy machinery until tomorrow (because of the sedation medicines used during the test).    FOLLOW UP: Our staff will call the number listed on your records the next business day following your procedure to check on you and address any questions or concerns that you may have regarding the information given to you following your procedure. If we do not reach you, we will leave a message.  However, if you are feeling well and you are not experiencing any problems, there is no need to return our call.  We will assume that you have returned to your regular daily activities without incident.  If any biopsies were taken you will be contacted by phone or by letter within the next 1-3 weeks.  Please call us at [phone] if you have not heard about the biopsies in 3 weeks.    SIGNATURES/CONFIDENTIALITY: You and/or your care partner have signed paperwork which will be entered into your electronic medical record.  These signatures attest to the fact that that the information above on your After Visit Summary has been reviewed and is understood.  Full responsibility of the confidentiality of this discharge information lies with you and/or your care-partner.  Please read gastritis handout provided.

## 2015-08-08 NOTE — Op Note (Signed)
[name] Patient Name: Barbara Wade Procedure Date: 08/08/2015 9:24 AM MRN: [id number] Endoscopist: Milus Banister , MD Age: 55 Referring MD:  Date of Birth: 06/09/1960 Gender: Female Account #: [id number] Procedure:                Upper GI endoscopy Indications:              Epigastric abdominal pain Medicines:                Monitored Anesthesia Care Procedure:                Pre-Anesthesia Assessment:                           - Prior to the procedure, a History and Physical                            was performed, and patient medications and                            allergies were reviewed. The patient's tolerance of                            previous anesthesia was also reviewed. The risks                            and benefits of the procedure and the sedation                            options and risks were discussed with the patient.                            All questions were answered, and informed consent                            was obtained. Prior Anticoagulants: The patient has                            taken no previous anticoagulant or antiplatelet                            agents. ASA Grade Assessment: II - A patient with                            mild systemic disease. After reviewing the risks                            and benefits, the patient was deemed in                            satisfactory condition to undergo the procedure.                           After obtaining informed consent, the endoscope was  passed under direct vision. Throughout the                            procedure, the patient's blood pressure, pulse, and                            oxygen saturations were monitored continuously. The                            Model GIF-[id number]) scope was introduced                            through the mouth, and advanced to the second part                            of duodenum. The  upper GI endoscopy was                            accomplished without difficulty. The patient                            tolerated the procedure well. Scope In: Scope Out: Findings:                 The esophagus was normal.                           Moderate inflammation characterized by erythema and                            linear erosions was found in the gastric antrum.                            Biopsies were taken with a cold forceps for                            histology.                           The examined duodenum was normal. Complications:            No immediate complications. Estimated blood loss:                            None. Estimated Blood Loss:     Estimated blood loss: none. Impression:               - Normal esophagus.                           - Gastritis (this may be from the alleve you've                            been taking the past month. Biopsied.                           - Normal examined duodenum. Recommendation:           -  Patient has a contact number available for                            emergencies. The signs and symptoms of potential                            delayed complications were discussed with the                            patient. Return to normal activities tomorrow.                            Written discharge instructions were provided to the                            patient.                           - Resume previous diet.                           - Please stop taking the alleve, also stop taking                            protonix. Neither of these medicines have seemed to                            really help your pains.                           - Await pathology results. If no sign of H. pylori                            infection, will likely ask that you return to see                            your primary care provider to consider other                            possible etiologies of the pain. Milus Banister,  MD 08/08/2015 9:40:32 AM This report has been signed electronically.

## 2015-08-08 NOTE — Progress Notes (Signed)
A and O x3. Report to RN. Tolerated MAC anesthesia well. 

## 2015-08-11 ENCOUNTER — Telehealth: Payer: Self-pay

## 2015-08-11 NOTE — Telephone Encounter (Signed)
  Follow up Call-  Call back number 08/08/2015  Post procedure Call Back phone  # [phone]  Permission to leave phone message Yes    Patient was called for follow up after her procedure on 7/14/207. No answer at the number given for follow up phone call. A message was left on the answering machine.

## 2015-08-18 ENCOUNTER — Other Ambulatory Visit: Payer: Self-pay

## 2015-08-18 MED ORDER — TRIAMTERENE-HCTZ 37.5-25 MG PO TABS: 1.0000 | ORAL_TABLET | Freq: Every day | ORAL | 0 refills | Status: DC

## 2015-08-21 ENCOUNTER — Telehealth: Payer: Self-pay | Admitting: Internal Medicine

## 2015-08-21 DIAGNOSIS — E1169 Type 2 diabetes mellitus with other specified complication: Secondary | ICD-10-CM

## 2015-08-21 DIAGNOSIS — E1165 Type 2 diabetes mellitus with hyperglycemia: Secondary | ICD-10-CM

## 2015-08-21 DIAGNOSIS — IMO0001 Reserved for inherently not codable concepts without codable children: Secondary | ICD-10-CM

## 2015-08-21 DIAGNOSIS — E785 Hyperlipidemia, unspecified: Secondary | ICD-10-CM

## 2015-08-21 DIAGNOSIS — E034 Atrophy of thyroid (acquired): Secondary | ICD-10-CM

## 2015-08-21 DIAGNOSIS — Z79899 Other long term (current) drug therapy: Secondary | ICD-10-CM

## 2015-08-21 NOTE — Telephone Encounter (Signed)
Pt called wanting to get labs done before her appt. Need order please and thank you!  Call pt @ [phone]. Thank you!

## 2015-08-21 NOTE — Telephone Encounter (Signed)
Last labs were on 01/30/2015, next appt is 09/30/15, please advise and order if needed. thanks

## 2015-08-21 NOTE — Assessment & Plan Note (Signed)
Lab Results  Component Value Date   HGBA1C 11.2 (H) 01/30/2015

## 2015-08-21 NOTE — Telephone Encounter (Signed)
Please move her appointment up if possible since Sept is well past 6 month follow up and she has uncontrolled DM by last visit ,. Fasting labs ordered

## 2015-08-21 NOTE — Telephone Encounter (Signed)
Can we try to move her appt up, Per Dr. Derrel Nip request, thanks

## 2015-08-27 ENCOUNTER — Other Ambulatory Visit (INDEPENDENT_AMBULATORY_CARE_PROVIDER_SITE_OTHER): Payer: BLUE CROSS/BLUE SHIELD

## 2015-08-27 DIAGNOSIS — E034 Atrophy of thyroid (acquired): Secondary | ICD-10-CM | POA: Diagnosis not present

## 2015-08-27 DIAGNOSIS — E785 Hyperlipidemia, unspecified: Secondary | ICD-10-CM | POA: Diagnosis not present

## 2015-08-27 DIAGNOSIS — E038 Other specified hypothyroidism: Secondary | ICD-10-CM | POA: Diagnosis not present

## 2015-08-27 DIAGNOSIS — E1169 Type 2 diabetes mellitus with other specified complication: Secondary | ICD-10-CM | POA: Diagnosis not present

## 2015-08-27 DIAGNOSIS — IMO0001 Reserved for inherently not codable concepts without codable children: Secondary | ICD-10-CM

## 2015-08-27 DIAGNOSIS — E1165 Type 2 diabetes mellitus with hyperglycemia: Secondary | ICD-10-CM

## 2015-08-27 DIAGNOSIS — Z79899 Other long term (current) drug therapy: Secondary | ICD-10-CM | POA: Diagnosis not present

## 2015-08-27 LAB — COMPREHENSIVE METABOLIC PANEL
ALBUMIN: 3.9 g/dL (ref 3.5–5.2)
ALK PHOS: 106 U/L (ref 39–117)
ALT: 32 U/L (ref 0–35)
AST: 21 U/L (ref 0–37)
BILIRUBIN TOTAL: 0.4 mg/dL (ref 0.2–1.2)
BUN: 24 mg/dL — ABNORMAL HIGH (ref 6–23)
CALCIUM: 9.4 mg/dL (ref 8.4–10.5)
CO2: 27 meq/L (ref 19–32)
CREATININE: 0.79 mg/dL (ref 0.40–1.20)
Chloride: 104 mEq/L (ref 96–112)
GFR: 79.63 mL/min (ref >60.00)
Glucose, Bld: 144 mg/dL — ABNORMAL HIGH (ref 70–99)
Potassium: 4 mEq/L (ref 3.5–5.1)
SODIUM: 141 meq/L (ref 135–145)
TOTAL PROTEIN: 7.1 g/dL (ref 6.0–8.3)

## 2015-08-27 LAB — LIPID PANEL
CHOLESTEROL: 192 mg/dL (ref 0–200)
HDL: 42.9 mg/dL (ref >39.00)
NonHDL: 149.29
Total CHOL/HDL Ratio: 4
Triglycerides: 232 mg/dL — ABNORMAL HIGH (ref 0.0–149.0)
VLDL: 46.4 mg/dL — AB (ref 0.0–40.0)

## 2015-08-27 LAB — HEMOGLOBIN A1C: Hgb A1c MFr Bld: 5.9 % (ref 4.6–6.5)

## 2015-08-27 LAB — VITAMIN B12: Vitamin B-12: 290 pg/mL (ref 211–911)

## 2015-08-27 LAB — LDL CHOLESTEROL, DIRECT: LDL DIRECT: 120 mg/dL

## 2015-08-27 LAB — TSH: TSH: 3.39 u[IU]/mL (ref 0.35–4.50)

## 2015-08-27 LAB — MICROALBUMIN / CREATININE URINE RATIO
CREATININE, U: 94.8 mg/dL
MICROALB/CREAT RATIO: 0.4 mg/g (ref 0.0–30.0)

## 2015-08-29 ENCOUNTER — Ambulatory Visit: Payer: BLUE CROSS/BLUE SHIELD | Admitting: Internal Medicine

## 2015-08-29 ENCOUNTER — Encounter: Payer: Self-pay | Admitting: Internal Medicine

## 2015-09-03 DIAGNOSIS — R070 Pain in throat: Secondary | ICD-10-CM | POA: Diagnosis not present

## 2015-09-03 DIAGNOSIS — J309 Allergic rhinitis, unspecified: Secondary | ICD-10-CM | POA: Diagnosis not present

## 2015-09-03 DIAGNOSIS — T783XXA Angioneurotic edema, initial encounter: Secondary | ICD-10-CM | POA: Diagnosis not present

## 2015-09-22 ENCOUNTER — Ambulatory Visit (INDEPENDENT_AMBULATORY_CARE_PROVIDER_SITE_OTHER): Payer: BLUE CROSS/BLUE SHIELD | Admitting: Internal Medicine

## 2015-09-22 ENCOUNTER — Encounter: Payer: Self-pay | Admitting: Internal Medicine

## 2015-09-22 VITALS — BP 106/70 | HR 69 | Temp 98.4°F | Resp 12 | Ht 63.0 in | Wt 213.5 lb

## 2015-09-22 DIAGNOSIS — R0789 Other chest pain: Secondary | ICD-10-CM

## 2015-09-22 DIAGNOSIS — E119 Type 2 diabetes mellitus without complications: Secondary | ICD-10-CM

## 2015-09-22 DIAGNOSIS — M948X8 Other specified disorders of cartilage, other site: Secondary | ICD-10-CM

## 2015-09-22 DIAGNOSIS — E669 Obesity, unspecified: Secondary | ICD-10-CM

## 2015-09-22 DIAGNOSIS — E785 Hyperlipidemia, unspecified: Secondary | ICD-10-CM

## 2015-09-22 DIAGNOSIS — R29898 Other symptoms and signs involving the musculoskeletal system: Secondary | ICD-10-CM

## 2015-09-22 DIAGNOSIS — M949 Disorder of cartilage, unspecified: Secondary | ICD-10-CM | POA: Diagnosis not present

## 2015-09-22 NOTE — Progress Notes (Signed)
Pre-visit discussion using our clinic review tool. No additional management support is needed unless otherwise documented below in the visit note.  

## 2015-09-22 NOTE — Progress Notes (Signed)
Subjective:  Patient ID: Barbara Wade, female    DOB: 1960/02/21  Age: 55 y.o. MRN: [id number]  CC: The primary encounter diagnosis was Xiphoid pain. Diagnoses of Xiphoid prominence, Obesity (BMI 30-39.9), Xiphoidalgia syndrome, Diabetes mellitus without complication ([place]), and Hyperlipidemia with target LDL less than 70 were also pertinent to this visit.  HPI Barbara Wade presents for follow up on Type 2 DM  With obesity , and persistnet epigastric pain.  She was referred for GI evaluation with  Endoscopy for persistent "severe" pain in her epigastric area that is aggravated by applying pressure to xiphoid process , with lying supine or on her side.  Not bothered  By walking Not relieved with NSAIDS.  EGD was normal. atient has fibromyalgia but cannot state that the pain is similar or different,  Does not appear uncomfortable currently although she describes the pain as "killing her.'   O besity:  She has been actively trying to lose weight,  Has been going to Weight [employer] for 3 months and has lost 8 lbs.   Exercising nly 2 times per week 30-40 mintues of walking on treadmill or Elliptiglider.    DM:  She is following a low GI diet and taking metformin and glipizide.  Has had no lows recently, no nausea or diarrhea.  Blood sugars ranging from 97 to 189 .         Lab Results  Component Value Date   HGBA1C 5.9 08/27/2015     Outpatient Medications Prior to Visit  Medication Sig Dispense Refill  . [employer]S D3 1000 units capsule TAKE 2 CAPSULES BY MOUTH DAILY 120 capsule 1  . dicyclomine (BENTYL) 10 MG capsule Take 1 capsule (10 mg total) by mouth 4 (four) times daily -  before meals and at bedtime. 120 capsule 2  . EPINEPHrine (EPIPEN) 0.3 mg/0.3 mL DEVI Inject 0.3 mLs (0.3 mg total) into the muscle once. 1 Device 6  . fluticasone (FLONASE) 50 MCG/ACT nasal spray Place 2 sprays into the nose daily as needed.     Marland Kitchen glipiZIDE (GLUCOTROL) 5 MG tablet TAKE 1 TABLET BY  MOUTH TWICE DAILY BEFORE A MEAL 60 tablet 5  . hyoscyamine (ANASPAZ) 0.125 MG TBDP disintergrating tablet Place 1 tablet (0.125 mg total) under the tongue every 4 (four) hours as needed for cramping. 60 tablet 0  . meclizine (ANTIVERT) 25 MG tablet Take 1 tablet (25 mg total) by mouth 2 (two) times daily as needed. 30 tablet 4  . metFORMIN (GLUCOPHAGE) 500 MG tablet Take 2 tablets (1,000 mg total) by mouth 2 (two) times daily with a meal. 60 tablet 11  . montelukast (SINGULAIR) 10 MG tablet TAKE 1 TABLET AT BEDTIME 90 tablet 3  . Red Yeast Rice Extract (CVS RED YEAST RICE) 600 MG CAPS Take 1 capsule (600 mg total) by mouth 2 (two) times daily after a meal. 180 capsule 1  . triamterene-hydrochlorothiazide (MAXZIDE-25) 37.5-25 MG tablet Take 1 tablet by mouth daily. NEEDS OV FOR FURTHER REFILLS 30 tablet 0  . fexofenadine (ALLEGRA) 180 MG tablet Take 1 tablet (180 mg total) by mouth daily. (Patient taking differently: Take 180 mg by mouth daily as needed. ) 30 tablet 5  . saccharomyces boulardii (FLORASTOR) 250 MG capsule Take 250 mg by mouth daily.    . chlorpheniramine (CHLOR-TRIMETON) 4 MG tablet Take 4 mg by mouth daily.    . pantoprazole (PROTONIX) 40 MG tablet TAKE 1 TABLET DAILY (Patient not taking: Reported on 09/22/2015)  90 tablet 1   No facility-administered medications prior to visit.     Review of Systems;  Patient denies headache, fevers, malaise, unintentional weight loss, skin rash, eye pain, sinus congestion and sinus pain, sore throat, dysphagia,  hemoptysis , cough, dyspnea, wheezing, chest pain, palpitations, orthopnea, edema, abdominal pain, nausea, melena, diarrhea, constipation, flank pain, dysuria, hematuria, urinary  Frequency, nocturia, numbness, tingling, seizures,  Focal weakness, Loss of consciousness,  Tremor, insomnia, depression, anxiety, and suicidal ideation.      Objective:  BP 106/70   Pulse 69   Temp 98.4 F (36.9 C) (Oral)   Resp 12   Ht 5\' 3"  (1.6 m)    Wt 213 lb 8 oz (96.8 kg)   SpO2 95%   BMI 37.82 kg/m   BP Readings from Last 3 Encounters:  09/22/15 106/70  08/08/15 117/72  06/30/15 116/72    Wt Readings from Last 3 Encounters:  09/22/15 213 lb 8 oz (96.8 kg)  08/08/15 215 lb (97.5 kg)  06/30/15 215 lb 6 oz (97.7 kg)    General appearance: alert, cooperative and appears stated age Ears: normal TM's and external ear canals both ears Throat: lips, mucosa, and tongue normal; teeth and gums normal Neck: no adenopathy, no carotid bruit, supple, symmetrical, trachea midline and thyroid not enlarged, symmetric, no tenderness/mass/nodules Back: symmetric, no curvature. ROM normal. No CVA tenderness. Lungs: clear to auscultation bilaterally Heart: regular rate and rhythm, S1, S2 normal, no murmur, click, rub or gallop Abdomen: soft, non-tender; bowel sounds normal; no masses,  no organomegaly Pulses: 2+ and symmetric Skin: Skin color, texture, turgor normal. No rashes or lesions Lymph nodes: Cervical, supraclavicular, and axillary nodes normal.  Lab Results  Component Value Date   HGBA1C 5.9 08/27/2015   HGBA1C 11.2 (H) 01/30/2015   HGBA1C 5.6 09/07/2013    Lab Results  Component Value Date   CREATININE 0.79 08/27/2015   CREATININE 0.87 01/30/2015   CREATININE 0.86 01/27/2015    Lab Results  Component Value Date   WBC 6.6 01/27/2015   HGB 14.8 01/27/2015   HCT 42.4 01/27/2015   PLT 239 01/27/2015   GLUCOSE 144 (H) 08/27/2015   CHOL 192 08/27/2015   TRIG 232.0 (H) 08/27/2015   HDL 42.90 08/27/2015   LDLDIRECT 120.0 08/27/2015   LDLCALC 125 10/12/2013   ALT 32 08/27/2015   AST 21 08/27/2015   NA 141 08/27/2015   K 4.0 08/27/2015   CL 104 08/27/2015   CREATININE 0.79 08/27/2015   BUN 24 (H) 08/27/2015   CO2 27 08/27/2015   TSH 3.39 08/27/2015   HGBA1C 5.9 08/27/2015   MICROALBUR <0.4 08/27/2015    Dg Chest 2 View  Result Date: 01/27/2015 CLINICAL DATA:  High blood sugar but not diabetic, cough since Dec  23rd, no other lung/ heart symptoms or surgeries per pt, shielded EXAM: CHEST  2 VIEW COMPARISON:  11/03/2012 FINDINGS: Right hemidiaphragm eventration anteriorly. Midline trachea. Normal heart size. Tortuous thoracic aorta. No pleural effusion or pneumothorax. Clear lungs. IMPRESSION: No acute cardiopulmonary disease. Electronically Signed   By: Abigail Miyamoto M.D.   On: 01/27/2015 17:35    Assessment & Plan:   Problem List Items Addressed This Visit    Hyperlipidemia with target LDL less than 70    Based on current lipid profile, the risk of clinically significant Coronary artery disease has been reduced from 10% to  8% over the next 10 years, using Red Yeast Rice (calculation done using the  the Framingham risk calculator).  Lab Results  Component Value Date   CHOL 192 08/27/2015   HDL 42.90 08/27/2015   LDLCALC 125 10/12/2013   LDLDIRECT 120.0 08/27/2015   TRIG 232.0 (H) 08/27/2015   CHOLHDL 4 08/27/2015         Diabetes mellitus without complication (HCC)    Currently well-controlled on current medications .  hemoglobin A1c is at goal of less than 7.0 . Patient is reminded to schedule an annual eye exam and foot exam is normal today. Patient has no microalbuminuria. Patient is tolerating Red Yeast Rice  For management of hyperlipidemia.  Advised to start a baby aspirin daily .  Lab Results  Component Value Date   HGBA1C 5.9 08/27/2015   Lab Results  Component Value Date   MICROALBUR <0.4 08/27/2015         Obesity (BMI 30-39.9)    She is followoing a low GI diet but has had minimal improvement despite 3 months of Weight Watchers.  Encouraged to increase exercise to 5 days per week.(from 2 currently) .  Once her abdominal pain issues have resolved will discuss trial of Saxenda for longterm management of DM and obesity       Xiphoidalgia syndrome - Primary    Suspected,  But recent chest x ray noted that right hemidiaphram eventration.  MRI abdomen ordered to rule out  herniation or mass behind the xiphoid .  May need referral to Pain management         Other Visit Diagnoses    Xiphoid prominence       Relevant Orders   MR Abdomen Wo Contrast      I have discontinued Ms. Von Hassel Davies's pantoprazole and chlorpheniramine. I am also having her maintain her EPINEPHrine, fluticasone, dicyclomine, meclizine, fexofenadine, Red Yeast Rice Extract, montelukast, hyoscyamine, saccharomyces boulardii, metFORMIN, glipiZIDE, CVS D3, and cetirizine.  Meds ordered this encounter  Medications  . cetirizine (ZYRTEC) 10 MG tablet    Sig: Take 10 mg by mouth 2 (two) times daily.    Medications Discontinued During This Encounter  Medication Reason  . pantoprazole (PROTONIX) 40 MG tablet Patient Preference  . chlorpheniramine (CHLOR-TRIMETON) 4 MG tablet Patient Preference    Follow-up: No Follow-up on file.   Crecencio Mc, MD

## 2015-09-22 NOTE — Patient Instructions (Signed)
Ordering MRI of abdomen to investigate the pain you are still having under your xiphoid process.  It may be a different kind of hernia.   Your diabetes remains under excellent control   Please continue your current medications. return in 3 months for follow up on diabetes and make sure you are seeing your eye doctor at least once a year.

## 2015-09-23 ENCOUNTER — Other Ambulatory Visit: Payer: Self-pay | Admitting: Internal Medicine

## 2015-09-23 DIAGNOSIS — R0789 Other chest pain: Secondary | ICD-10-CM | POA: Insufficient documentation

## 2015-09-23 NOTE — Assessment & Plan Note (Signed)
Currently well-controlled on current medications .  hemoglobin A1c is at goal of less than 7.0 . Patient is reminded to schedule an annual eye exam and foot exam is normal today. Patient has no microalbuminuria. Patient is tolerating Red Yeast Rice  For management of hyperlipidemia.  Advised to start a baby aspirin daily .  Lab Results  Component Value Date   HGBA1C 5.9 08/27/2015   Lab Results  Component Value Date   MICROALBUR <0.4 08/27/2015

## 2015-09-23 NOTE — Assessment & Plan Note (Addendum)
She is followoing a low GI diet but has had minimal improvement despite 3 months of Weight Watchers.  Encouraged to increase exercise to 5 days per week.(from 2 currently) .  Once her abdominal pain issues have resolved will discuss trial of Saxenda for longterm management of DM and obesity

## 2015-09-23 NOTE — Assessment & Plan Note (Addendum)
Based on current lipid profile, the risk of clinically significant Coronary artery disease has been reduced from 10% to  8% over the next 10 years, using Red Yeast Rice (calculation done using the  the Framingham risk calculator).    Lab Results  Component Value Date   CHOL 192 08/27/2015   HDL 42.90 08/27/2015   LDLCALC 125 10/12/2013   LDLDIRECT 120.0 08/27/2015   TRIG 232.0 (H) 08/27/2015   CHOLHDL 4 08/27/2015

## 2015-09-23 NOTE — Assessment & Plan Note (Signed)
Suspected,  But recent chest x ray noted that right hemidiaphram eventration.  MRI abdomen ordered to rule out herniation or mass behind the xiphoid .  May need referral to Pain management

## 2015-09-26 ENCOUNTER — Ambulatory Visit: Payer: BLUE CROSS/BLUE SHIELD

## 2015-09-30 ENCOUNTER — Ambulatory Visit: Payer: BLUE CROSS/BLUE SHIELD | Admitting: Internal Medicine

## 2015-10-07 ENCOUNTER — Encounter: Payer: Self-pay | Admitting: Internal Medicine

## 2015-10-07 ENCOUNTER — Ambulatory Visit
Admission: RE | Admit: 2015-10-07 | Discharge: 2015-10-07 | Disposition: A | Discharge status: Home / Self Care | Payer: BLUE CROSS/BLUE SHIELD | Source: Ambulatory Visit | Attending: Internal Medicine | Admitting: Internal Medicine

## 2015-10-07 DIAGNOSIS — M948X8 Other specified disorders of cartilage, other site: Secondary | ICD-10-CM | POA: Diagnosis not present

## 2015-10-07 DIAGNOSIS — R1010 Upper abdominal pain, unspecified: Secondary | ICD-10-CM | POA: Diagnosis not present

## 2015-10-07 DIAGNOSIS — N281 Cyst of kidney, acquired: Secondary | ICD-10-CM | POA: Diagnosis not present

## 2015-10-07 DIAGNOSIS — R0789 Other chest pain: Secondary | ICD-10-CM

## 2015-10-07 DIAGNOSIS — Z9049 Acquired absence of other specified parts of digestive tract: Secondary | ICD-10-CM | POA: Diagnosis not present

## 2015-10-07 DIAGNOSIS — R29898 Other symptoms and signs involving the musculoskeletal system: Secondary | ICD-10-CM

## 2015-10-21 DIAGNOSIS — J3089 Other allergic rhinitis: Secondary | ICD-10-CM | POA: Diagnosis not present

## 2015-10-21 DIAGNOSIS — T781XXA Other adverse food reactions, not elsewhere classified, initial encounter: Secondary | ICD-10-CM | POA: Diagnosis not present

## 2015-10-21 DIAGNOSIS — T783XXA Angioneurotic edema, initial encounter: Secondary | ICD-10-CM | POA: Diagnosis not present

## 2015-10-21 DIAGNOSIS — R062 Wheezing: Secondary | ICD-10-CM | POA: Diagnosis not present

## 2015-10-27 ENCOUNTER — Encounter: Payer: Self-pay | Admitting: Internal Medicine

## 2015-10-28 DIAGNOSIS — R062 Wheezing: Secondary | ICD-10-CM | POA: Diagnosis not present

## 2015-10-28 DIAGNOSIS — J3089 Other allergic rhinitis: Secondary | ICD-10-CM | POA: Diagnosis not present

## 2015-11-25 ENCOUNTER — Encounter: Payer: Self-pay | Admitting: Internal Medicine

## 2015-12-02 DIAGNOSIS — R062 Wheezing: Secondary | ICD-10-CM | POA: Diagnosis not present

## 2015-12-02 DIAGNOSIS — J3089 Other allergic rhinitis: Secondary | ICD-10-CM | POA: Diagnosis not present

## 2015-12-02 DIAGNOSIS — Z6838 Body mass index (BMI) 38.0-38.9, adult: Secondary | ICD-10-CM | POA: Diagnosis not present

## 2015-12-02 DIAGNOSIS — T783XXD Angioneurotic edema, subsequent encounter: Secondary | ICD-10-CM | POA: Diagnosis not present

## 2015-12-22 ENCOUNTER — Telehealth: Payer: Self-pay

## 2015-12-22 DIAGNOSIS — B1910 Unspecified viral hepatitis B without hepatic coma: Secondary | ICD-10-CM

## 2015-12-22 DIAGNOSIS — E559 Vitamin D deficiency, unspecified: Secondary | ICD-10-CM

## 2015-12-22 DIAGNOSIS — E785 Hyperlipidemia, unspecified: Secondary | ICD-10-CM

## 2015-12-22 DIAGNOSIS — E119 Type 2 diabetes mellitus without complications: Secondary | ICD-10-CM

## 2015-12-22 DIAGNOSIS — E039 Hypothyroidism, unspecified: Secondary | ICD-10-CM

## 2015-12-22 NOTE — Telephone Encounter (Signed)
Pt coming for labs 12/23/15. Please place future orders. Thank you.

## 2015-12-23 ENCOUNTER — Other Ambulatory Visit (INDEPENDENT_AMBULATORY_CARE_PROVIDER_SITE_OTHER): Payer: BLUE CROSS/BLUE SHIELD

## 2015-12-23 DIAGNOSIS — B1910 Unspecified viral hepatitis B without hepatic coma: Secondary | ICD-10-CM

## 2015-12-23 DIAGNOSIS — E119 Type 2 diabetes mellitus without complications: Secondary | ICD-10-CM | POA: Diagnosis not present

## 2015-12-23 DIAGNOSIS — E785 Hyperlipidemia, unspecified: Secondary | ICD-10-CM

## 2015-12-23 DIAGNOSIS — E039 Hypothyroidism, unspecified: Secondary | ICD-10-CM | POA: Diagnosis not present

## 2015-12-23 LAB — COMPREHENSIVE METABOLIC PANEL
ALK PHOS: 102 U/L (ref 39–117)
ALT: 30 U/L (ref 0–35)
AST: 24 U/L (ref 0–37)
Albumin: 4.2 g/dL (ref 3.5–5.2)
BUN: 23 mg/dL (ref 6–23)
CALCIUM: 9.8 mg/dL (ref 8.4–10.5)
CO2: 30 meq/L (ref 19–32)
Chloride: 101 mEq/L (ref 96–112)
Creatinine, Ser: 0.88 mg/dL (ref 0.40–1.20)
GFR: 70.22 mL/min (ref >60.00)
GLUCOSE: 128 mg/dL — AB (ref 70–99)
POTASSIUM: 4.3 meq/L (ref 3.5–5.1)
Sodium: 141 mEq/L (ref 135–145)
TOTAL PROTEIN: 7.2 g/dL (ref 6.0–8.3)
Total Bilirubin: 0.6 mg/dL (ref 0.2–1.2)

## 2015-12-23 LAB — LIPID PANEL
CHOL/HDL RATIO: 5
CHOLESTEROL: 217 mg/dL — AB (ref 0–200)
HDL: 44.1 mg/dL (ref >39.00)
NonHDL: 173.17
TRIGLYCERIDES: 280 mg/dL — AB (ref 0.0–149.0)
VLDL: 56 mg/dL — ABNORMAL HIGH (ref 0.0–40.0)

## 2015-12-23 LAB — MICROALBUMIN / CREATININE URINE RATIO
Creatinine,U: 167.7 mg/dL
Microalb Creat Ratio: 1.2 mg/g (ref 0.0–30.0)
Microalb, Ur: 2 mg/dL — ABNORMAL HIGH (ref 0.0–1.9)

## 2015-12-23 LAB — HEMOGLOBIN A1C: Hgb A1c MFr Bld: 6.2 % (ref 4.6–6.5)

## 2015-12-23 LAB — TSH: TSH: 3.83 u[IU]/mL (ref 0.35–4.50)

## 2015-12-23 LAB — LDL CHOLESTEROL, DIRECT: LDL DIRECT: 116 mg/dL

## 2015-12-24 LAB — HEPATITIS C ANTIBODY: HCV AB: NEGATIVE

## 2015-12-26 ENCOUNTER — Encounter: Payer: Self-pay | Admitting: Internal Medicine

## 2015-12-26 ENCOUNTER — Ambulatory Visit (INDEPENDENT_AMBULATORY_CARE_PROVIDER_SITE_OTHER): Payer: BLUE CROSS/BLUE SHIELD | Admitting: Internal Medicine

## 2015-12-26 VITALS — BP 126/74 | HR 68 | Temp 98.0°F | Resp 12 | Ht 63.0 in | Wt 210.5 lb

## 2015-12-26 DIAGNOSIS — E785 Hyperlipidemia, unspecified: Secondary | ICD-10-CM

## 2015-12-26 DIAGNOSIS — E118 Type 2 diabetes mellitus with unspecified complications: Secondary | ICD-10-CM | POA: Diagnosis not present

## 2015-12-26 DIAGNOSIS — R5383 Other fatigue: Secondary | ICD-10-CM

## 2015-12-26 DIAGNOSIS — E559 Vitamin D deficiency, unspecified: Secondary | ICD-10-CM

## 2015-12-26 DIAGNOSIS — E119 Type 2 diabetes mellitus without complications: Secondary | ICD-10-CM

## 2015-12-26 MED ORDER — ERGOCALCIFEROL 1.25 MG (50000 UT) PO CAPS: 50000.0000 [IU] | ORAL_CAPSULE | ORAL | 2 refills | Status: DC

## 2015-12-26 NOTE — Patient Instructions (Addendum)
Compare Cetaphil , Eucerin and Aveeno to your current moisturizer. They are considered to be the best emollient samples available without prescription   Use one daily after shower  Suspend metformin for 3 months,  continue glipizide,    Check sugars once daily variable times.  Send me the readings in 1-2 weeks  so I can decide if additional medication is needed   Increase D to 50,000 IUs D3 weekly   via rx   GET YOUR FLU VACCINE ASAP!!

## 2015-12-26 NOTE — Progress Notes (Signed)
Pre-visit discussion using our clinic review tool. No additional management support is needed unless otherwise documented below in the visit note.  

## 2015-12-26 NOTE — Progress Notes (Signed)
Subjective:  Patient ID: Barbara Wade, female    DOB: [birth year]  Age: 55 y.o. MRN: [id number]  CC: The primary encounter diagnosis was Type 2 diabetes mellitus with complication, without long-term current use of insulin ([place]). Diagnoses of Hyperlipidemia with target LDL less than 70, Vitamin D deficiency, Fatigue, unspecified type, and Diabetes mellitus without complication ([place]) were also pertinent to this visit.  HPI Barbara Wade presents for 3 month follow up on diabetes.  Patient has no complaints today.  Patient is following a low glycemic index  gluten free  diet and taking all prescribed medications regularly without side effects.  Fasting sugars have been under less than 140 most of the time and post prandials have been under 160 except on rare occasions. Patient is exercising about 3 times per week and intentionally trying to lose weight .  Patient has had an eye exam in the last 12 months and checks feet regularly for signs of infection.  Patient does not walk barefoot outside,  And denies an numbness tingling or burning in feet. Patient is up to date on all recommended vaccinations  Following Gluten free dairy free diet.  Tinnitus has resolved .  swelling has improved  Off all allergy meds, no symptoms since changing diet Was sent to allergist at [facility] for recurrent throat swelling, but now off all meds    Flu shot postponed,  Not getting it until January  Foot exam done  Right knee painful at the top of the tibia, and  medial plateau  And shin bone hurting. Has history of vitamin d deficiency and fibromyalgia   Outpatient Medications Prior to Visit  Medication Sig Dispense Refill  . cetirizine (ZYRTEC) 10 MG tablet Take 10 mg by mouth 2 (two) times daily.    . CVS D3 1000 units capsule TAKE 2 CAPSULES BY MOUTH DAILY 120 capsule 1  . EPINEPHrine (EPIPEN) 0.3 mg/0.3 mL DEVI Inject 0.3 mLs (0.3 mg total) into the muscle once. 1 Device 6  . fluticasone (FLONASE)  50 MCG/ACT nasal spray Place 2 sprays into the nose daily as needed.     Marland Kitchen glipiZIDE (GLUCOTROL) 5 MG tablet TAKE 1 TABLET BY MOUTH TWICE DAILY BEFORE A MEAL 60 tablet 5  . hyoscyamine (ANASPAZ) 0.125 MG TBDP disintergrating tablet Place 1 tablet (0.125 mg total) under the tongue every 4 (four) hours as needed for cramping. 60 tablet 0  . meclizine (ANTIVERT) 25 MG tablet Take 1 tablet (25 mg total) by mouth 2 (two) times daily as needed. 30 tablet 4  . metFORMIN (GLUCOPHAGE) 500 MG tablet Take 2 tablets (1,000 mg total) by mouth 2 (two) times daily with a meal. 60 tablet 11  . Red Yeast Rice Extract (CVS RED YEAST RICE) 600 MG CAPS Take 1 capsule (600 mg total) by mouth 2 (two) times daily after a meal. 180 capsule 1  . triamterene-hydrochlorothiazide (MAXZIDE-25) 37.5-25 MG tablet Take 1 tablet by mouth daily. 90 tablet 2  . dicyclomine (BENTYL) 10 MG capsule Take 1 capsule (10 mg total) by mouth 4 (four) times daily -  before meals and at bedtime. (Patient not taking: Reported on 12/26/2015) 120 capsule 2  . fexofenadine (ALLEGRA) 180 MG tablet Take 1 tablet (180 mg total) by mouth daily. (Patient taking differently: Take 180 mg by mouth daily as needed. ) 30 tablet 5  . montelukast (SINGULAIR) 10 MG tablet TAKE 1 TABLET AT BEDTIME (Patient not taking: Reported on 12/26/2015) 90 tablet 3  .  saccharomyces boulardii (FLORASTOR) 250 MG capsule Take 250 mg by mouth daily.     No facility-administered medications prior to visit.     Review of Systems;  Patient denies headache, fevers, malaise, unintentional weight loss, skin rash, eye pain, sinus congestion and sinus pain, sore throat, dysphagia,  hemoptysis , cough, dyspnea, wheezing, chest pain, palpitations, orthopnea, edema, abdominal pain, nausea, melena, diarrhea, constipation, flank pain, dysuria, hematuria, urinary  Frequency, nocturia, numbness, tingling, seizures,  Focal weakness, Loss of consciousness,  Tremor, insomnia, depression, anxiety,  and suicidal ideation.      Objective:  BP 126/74   Pulse 68   Temp 98 F (36.7 C) (Oral)   Resp 12   Ht 5\' 3"  (1.6 m)   Wt 210 lb 8 oz (95.5 kg)   SpO2 95%   BMI 37.29 kg/m   BP Readings from Last 3 Encounters:  12/26/15 126/74  09/22/15 106/70  08/08/15 117/72    Wt Readings from Last 3 Encounters:  12/26/15 210 lb 8 oz (95.5 kg)  09/22/15 213 lb 8 oz (96.8 kg)  08/08/15 215 lb (97.5 kg)    General appearance: alert, cooperative and appears stated age Ears: normal TM's and external ear canals both ears Throat: lips, mucosa, and tongue normal; teeth and gums normal Neck: no adenopathy, no carotid bruit, supple, symmetrical, trachea midline and thyroid not enlarged, symmetric, no tenderness/mass/nodules Back: symmetric, no curvature. ROM normal. No CVA tenderness. Lungs: clear to auscultation bilaterally Heart: regular rate and rhythm, S1, S2 normal, no murmur, click, rub or gallop Abdomen: soft, non-tender; bowel sounds normal; no masses,  no organomegaly Pulses: 2+ and symmetric Skin: Skin color, texture, turgor normal. No rashes or lesions Lymph nodes: Cervical, supraclavicular, and axillary nodes normal.  Lab Results  Component Value Date   HGBA1C 6.2 12/23/2015   HGBA1C 5.9 08/27/2015   HGBA1C 11.2 (H) 01/30/2015    Lab Results  Component Value Date   CREATININE 0.88 12/23/2015   CREATININE 0.79 08/27/2015   CREATININE 0.87 01/30/2015    Lab Results  Component Value Date   WBC 6.6 01/27/2015   HGB 14.8 01/27/2015   HCT 42.4 01/27/2015   PLT 239 01/27/2015   GLUCOSE 128 (H) 12/23/2015   CHOL 217 (H) 12/23/2015   TRIG 280.0 (H) 12/23/2015   HDL 44.10 12/23/2015   LDLDIRECT 116.0 12/23/2015   LDLCALC 125 10/12/2013   ALT 30 12/23/2015   AST 24 12/23/2015   NA 141 12/23/2015   K 4.3 12/23/2015   CL 101 12/23/2015   CREATININE 0.88 12/23/2015   BUN 23 12/23/2015   CO2 30 12/23/2015   TSH 3.83 12/23/2015   HGBA1C 6.2 12/23/2015   MICROALBUR  2.0 (H) 12/23/2015    Mr Abdomen Wo Contrast  Result Date: 10/07/2015 CLINICAL DATA:  Pain and upper abdomen years xiphoid process. EXAM: MRI ABDOMEN WITHOUT CONTRAST TECHNIQUE: Multiplanar multisequence MR imaging was performed without the administration of intravenous contrast. COMPARISON:  11/25/14 FINDINGS: Lower chest: No pleural or pericardial effusion identified. Hepatobiliary: Marked diffuse hepatic steatosis. No focal liver abnormality. Previous cholecystectomy. Increase caliber of the extrahepatic common bile duct which measures 11 mm. No obstructing stone or mass. Pancreas:  Normal appearance of the pancreas. Spleen:  Normal appearance of the spleen. Adrenals/Urinary Tract: The adrenal glands appear normal. Cyst in the right kidney measures 1.5 cm. No hydronephrosis or kidney mass identified. Stomach/Bowel: The stomach appears normal. No pathologic dilatation of the upper abdominal bowel loops. Vascular/Lymphatic: Normal appearance of the abdominal aorta.  No upper abdominal adenopathy. Other:  No free fluid or fluid collections identified. Musculoskeletal: No suspicious bone lesions identified.No abnormal signal identified. IMPRESSION: 1. No acute findings and no explanation for patient's upper abdominal pain. 2. Right kidney cysts. 3. Previous cholecystectomy. Electronically Signed   By: Kerby Moors M.D.   On: 10/07/2015 10:23    Assessment & Plan:   Problem List Items Addressed This Visit    RESOLVED: Diabetes mellitus ([place]) - Primary   Relevant Orders   Comprehensive metabolic panel   Hemoglobin A1c   Lipid panel   Microalbumin / creatinine urine ratio   Diabetes mellitus without complication (HCC)    Currently well-controlled on current medications .  hemoglobin A1c is at goal of less than 7.0 she would like to try suspending metformin for 3 months, . Patient is reminded to schedule an annual eye exam and foot exam is normal today. Patient has minimal  microalbuminuria. Patient is  tolerating Red Yeast Rice  For management of hyperlipidemia.  Advised to start a baby aspirin daily .  Lab Results  Component Value Date   HGBA1C 6.2 12/23/2015   Lab Results  Component Value Date   MICROALBUR 2.0 (H) 12/23/2015         Hyperlipidemia with target LDL less than 70    Based on current lipid profile, the risk of clinically significant Coronary artery disease has increased to 19% over the next 10 years, using Red Yeast Rice (calculation done using the  the Framingham risk calculator).   She prefers to avoid statin therapy   Lab Results  Component Value Date   CHOL 217 (H) 12/23/2015   HDL 44.10 12/23/2015   LDLCALC 125 10/12/2013   LDLDIRECT 116.0 12/23/2015   TRIG 280.0 (H) 12/23/2015   CHOLHDL 5 12/23/2015         Relevant Orders   LDL cholesterol, direct   Vitamin D deficiency   Relevant Orders   VITAMIN D 25 Hydroxy (Vit-D Deficiency, Fractures)    Other Visit Diagnoses    Fatigue, unspecified type       Relevant Orders   TSH      I am having Ms. Von Amie Wade start on ergocalciferol. I am also having her maintain her EPINEPHrine, fluticasone, dicyclomine, meclizine, fexofenadine, Red Yeast Rice Extract, montelukast, hyoscyamine, saccharomyces boulardii, metFORMIN, glipiZIDE, CVS D3, cetirizine, and triamterene-hydrochlorothiazide.  Meds ordered this encounter  Medications  . ergocalciferol (DRISDOL) 50000 units capsule    Sig: Take 1 capsule (50,000 Units total) by mouth once a week.    Dispense:  4 capsule    Refill:  2    There are no discontinued medications.  Follow-up: Return in about 3 months (around 03/25/2016) for follow up diabetes.   Crecencio Mc, MD

## 2015-12-28 NOTE — Assessment & Plan Note (Addendum)
Based on current lipid profile, the risk of clinically significant Coronary artery disease has increased to 19% over the next 10 years, using Red Yeast Rice (calculation done using the  the Framingham risk calculator).   She prefers to avoid statin therapy   Lab Results  Component Value Date   CHOL 217 (H) 12/23/2015   HDL 44.10 12/23/2015   LDLCALC 125 10/12/2013   LDLDIRECT 116.0 12/23/2015   TRIG 280.0 (H) 12/23/2015   CHOLHDL 5 12/23/2015

## 2015-12-28 NOTE — Assessment & Plan Note (Addendum)
Currently well-controlled on current medications .  hemoglobin A1c is at goal of less than 7.0 she would like to try suspending metformin for 3 months, . Patient is reminded to schedule an annual eye exam and foot exam is normal today. Patient has minimal  microalbuminuria. Patient is tolerating Red Yeast Rice  For management of hyperlipidemia.  Advised to start a baby aspirin daily .  Lab Results  Component Value Date   HGBA1C 6.2 12/23/2015   Lab Results  Component Value Date   MICROALBUR 2.0 (H) 12/23/2015

## 2016-02-16 ENCOUNTER — Telehealth: Payer: Self-pay | Admitting: Internal Medicine

## 2016-02-16 ENCOUNTER — Ambulatory Visit: Payer: BLUE CROSS/BLUE SHIELD

## 2016-02-16 DIAGNOSIS — G8929 Other chronic pain: Secondary | ICD-10-CM

## 2016-02-16 DIAGNOSIS — M25561 Pain in right knee: Principal | ICD-10-CM

## 2016-02-16 NOTE — Telephone Encounter (Signed)
correct 

## 2016-02-16 NOTE — Telephone Encounter (Signed)
Pt called and stated that when she saw Dr. Derrel Nip in December they discussed right knee pain and Dr. Derrel Nip advise to take Vitamin D to see if that helps. PT stated that it has not helped and Dr. Derrel Nip had mentioned something about an xray. Did Dr. Derrel Nip want her to have a xray before and appt? Please advise, thank you!  Call pt  @ [phone]

## 2016-02-16 NOTE — Telephone Encounter (Deleted)
No my mistake weekly

## 2016-02-16 NOTE — Telephone Encounter (Signed)
Patient still has C/O right Knee pain dicussed with PCP on 12/26/15 patient has tried  Vit d 50, 000 units daily as suggested for 6 weeks, pain in right knee is still the same no improvement. Patient requesting could we do an X-ray .

## 2016-02-16 NOTE — Telephone Encounter (Signed)
Patient coming in for X-ray she reverbalized this time you were right she has only been taking Vit D weekly patient was notified and will come in to morrow for X-ray. We do not need Vit d if only taking weekly right?

## 2016-02-16 NOTE — Telephone Encounter (Signed)
If she has been taking 50,000 Ius of D3 daily she may have toxic levels of Vit D. Confirm that she has been taking it weekly  Not daily.  otherwise not due for a1c /labs until after feb 28 .  right knee films and vit d level ordered.

## 2016-02-17 ENCOUNTER — Other Ambulatory Visit: Payer: Self-pay | Admitting: Internal Medicine

## 2016-02-17 ENCOUNTER — Ambulatory Visit (INDEPENDENT_AMBULATORY_CARE_PROVIDER_SITE_OTHER): Payer: BLUE CROSS/BLUE SHIELD

## 2016-02-17 ENCOUNTER — Encounter: Payer: Self-pay | Admitting: Internal Medicine

## 2016-02-17 DIAGNOSIS — M25561 Pain in right knee: Secondary | ICD-10-CM | POA: Diagnosis not present

## 2016-02-17 DIAGNOSIS — G8929 Other chronic pain: Secondary | ICD-10-CM | POA: Diagnosis not present

## 2016-02-17 MED ORDER — TRAMADOL HCL 50 MG PO TABS: 50.0000 mg | ORAL_TABLET | Freq: Three times a day (TID) | ORAL | 0 refills | Status: DC | PRN

## 2016-02-17 MED ORDER — PREDNISONE 10 MG PO TABS: ORAL_TABLET | ORAL | 0 refills | Status: DC

## 2016-02-21 ENCOUNTER — Other Ambulatory Visit: Payer: Self-pay | Admitting: Internal Medicine

## 2016-02-21 DIAGNOSIS — M25561 Pain in right knee: Secondary | ICD-10-CM

## 2016-03-03 ENCOUNTER — Encounter: Payer: Self-pay | Admitting: Internal Medicine

## 2016-03-17 ENCOUNTER — Other Ambulatory Visit: Payer: Self-pay | Admitting: Internal Medicine

## 2016-03-29 ENCOUNTER — Other Ambulatory Visit (INDEPENDENT_AMBULATORY_CARE_PROVIDER_SITE_OTHER): Payer: BLUE CROSS/BLUE SHIELD

## 2016-03-29 DIAGNOSIS — E559 Vitamin D deficiency, unspecified: Secondary | ICD-10-CM

## 2016-03-29 DIAGNOSIS — E118 Type 2 diabetes mellitus with unspecified complications: Secondary | ICD-10-CM | POA: Diagnosis not present

## 2016-03-29 DIAGNOSIS — R5383 Other fatigue: Secondary | ICD-10-CM

## 2016-03-29 DIAGNOSIS — E785 Hyperlipidemia, unspecified: Secondary | ICD-10-CM

## 2016-03-29 LAB — MICROALBUMIN / CREATININE URINE RATIO
CREATININE, U: 178.8 mg/dL
MICROALB UR: 1.5 mg/dL (ref 0.0–1.9)
MICROALB/CREAT RATIO: 0.8 mg/g (ref 0.0–30.0)

## 2016-03-29 LAB — HEMOGLOBIN A1C: Hgb A1c MFr Bld: 7.1 % — ABNORMAL HIGH (ref 4.6–6.5)

## 2016-03-29 LAB — COMPREHENSIVE METABOLIC PANEL
ALBUMIN: 4 g/dL (ref 3.5–5.2)
ALK PHOS: 101 U/L (ref 39–117)
ALT: 31 U/L (ref 0–35)
AST: 22 U/L (ref 0–37)
BUN: 14 mg/dL (ref 6–23)
CO2: 25 mEq/L (ref 19–32)
CREATININE: 0.78 mg/dL (ref 0.40–1.20)
Calcium: 9.7 mg/dL (ref 8.4–10.5)
Chloride: 105 mEq/L (ref 96–112)
GFR: 80.64 mL/min (ref >60.00)
Glucose, Bld: 174 mg/dL — ABNORMAL HIGH (ref 70–99)
POTASSIUM: 4.3 meq/L (ref 3.5–5.1)
SODIUM: 144 meq/L (ref 135–145)
TOTAL PROTEIN: 6.8 g/dL (ref 6.0–8.3)
Total Bilirubin: 0.6 mg/dL (ref 0.2–1.2)

## 2016-03-29 LAB — LIPID PANEL
CHOL/HDL RATIO: 5
Cholesterol: 214 mg/dL — ABNORMAL HIGH (ref 0–200)
HDL: 45.4 mg/dL (ref >39.00)
NONHDL: 168.52
Triglycerides: 204 mg/dL — ABNORMAL HIGH (ref 0.0–149.0)
VLDL: 40.8 mg/dL — AB (ref 0.0–40.0)

## 2016-03-29 LAB — LDL CHOLESTEROL, DIRECT: Direct LDL: 130 mg/dL

## 2016-03-29 LAB — VITAMIN D 25 HYDROXY (VIT D DEFICIENCY, FRACTURES): VITD: 31.94 ng/mL (ref 30.00–100.00)

## 2016-03-29 LAB — TSH: TSH: 1.66 u[IU]/mL (ref 0.35–4.50)

## 2016-03-31 ENCOUNTER — Encounter: Payer: Self-pay | Admitting: Internal Medicine

## 2016-04-02 ENCOUNTER — Encounter: Payer: Self-pay | Admitting: Internal Medicine

## 2016-04-02 ENCOUNTER — Telehealth: Payer: Self-pay | Admitting: *Deleted

## 2016-04-02 ENCOUNTER — Ambulatory Visit (INDEPENDENT_AMBULATORY_CARE_PROVIDER_SITE_OTHER): Payer: BLUE CROSS/BLUE SHIELD | Admitting: Internal Medicine

## 2016-04-02 VITALS — BP 118/72 | HR 68 | Resp 16 | Ht 63.0 in | Wt 216.0 lb

## 2016-04-02 DIAGNOSIS — Z1231 Encounter for screening mammogram for malignant neoplasm of breast: Secondary | ICD-10-CM | POA: Diagnosis not present

## 2016-04-02 DIAGNOSIS — E119 Type 2 diabetes mellitus without complications: Secondary | ICD-10-CM

## 2016-04-02 DIAGNOSIS — E785 Hyperlipidemia, unspecified: Secondary | ICD-10-CM

## 2016-04-02 DIAGNOSIS — I890 Lymphedema, not elsewhere classified: Secondary | ICD-10-CM | POA: Diagnosis not present

## 2016-04-02 DIAGNOSIS — Z1239 Encounter for other screening for malignant neoplasm of breast: Secondary | ICD-10-CM

## 2016-04-02 NOTE — Progress Notes (Signed)
Pre visit review using our clinic review tool, if applicable. No additional management support is needed unless otherwise documented below in the visit note. 

## 2016-04-02 NOTE — Patient Instructions (Addendum)
Your diabetes is still  under excellent control good control but your A1c has increased to 6.1.   No change in medications are needed.   Remember that reducing your  carbohydrates  And getting back to a regular exercise program  Will lower your blood sugars    Please return in 3 months for diabetes follow up.    You can stop mega dose of D and continue  Up to 2000 Ius of D3 daily   No flora star  Needed unless it helps your GI tract.  An alternative if you decide to resume a probiotic is the one by [employer] : they make a chocolate covered probiotic  "Digestive Advantage"

## 2016-04-02 NOTE — Telephone Encounter (Signed)
Pt requested orders for labs prior to her appt in June

## 2016-04-02 NOTE — Progress Notes (Signed)
Subjective:  Patient ID: Barbara Wade, female    DOB: 12/17/1960  Age: 56 y.o. MRN: [id number]  CC: The primary encounter diagnosis was Breast cancer screening. Diagnoses of Lymphedema of both lower extremities, Diabetes mellitus without complication ([place]), Morbid obesity ([place]), and Hyperlipidemia with target LDL less than 70 were also pertinent to this visit.  HPI Barbara Wade presents for follow up on type 2 dm,  Obesity and fibromyalgia  3 month follow up on diabetes.  Patient has no complaints today.  Patient is following a low glycemic index diet and taking all prescribed medications regularly without side effects.  Fasting sugars have been under less than 140 most of the time and post prandials have been under 160 except on rare occasions. Patient is walking about 3 times per week and intentionally trying to lose weight .  Patient has had an eye exam in the last 12 months and checks feet regularly for signs of infection.  Patient does not walk barefoot outside,  And denies any numbness tingling or burning in feet. 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     Outpatient Medications Prior to Visit  Medication Sig Dispense Refill  . CVS D3 1000 units capsule TAKE 2 CAPSULES BY MOUTH DAILY 120 capsule 1  . EPINEPHrine (EPIPEN) 0.3 mg/0.3 mL DEVI Inject 0.3 mLs (0.3 mg total) into the muscle once. 1 Device 6  . ergocalciferol (DRISDOL) 50000 units capsule Take 1 capsule (50,000 Units total) by mouth once a week. 4 capsule 2  . fluticasone (FLONASE) 50 MCG/ACT nasal spray Place 2 sprays into the nose daily as needed.     Marland Kitchen glipiZIDE (GLUCOTROL) 5 MG tablet TAKE 1 TABLET BY MOUTH TWICE  DAILY BEFORE A MEAL 60 tablet 0  . hyoscyamine (ANASPAZ) 0.125 MG TBDP disintergrating tablet Place 1 tablet (0.125 mg total) under the tongue every 4 (four) hours as needed for cramping. 60 tablet 0  . meclizine (ANTIVERT) 25 MG tablet Take 1 tablet (25 mg total) by mouth 2 (two) times daily as needed. 30 tablet 4  . Red Yeast Rice Extract (CVS RED YEAST RICE) 600 MG CAPS Take 1 capsule (600 mg total) by mouth 2 (two) times daily after a meal. 180 capsule 1  . saccharomyces boulardii (FLORASTOR) 250 MG capsule Take 250 mg by mouth daily.    Marland Kitchen triamterene-hydrochlorothiazide (MAXZIDE-25) 37.5-25 MG tablet Take 1 tablet by mouth daily. 90 tablet 2  . fexofenadine (ALLEGRA) 180 MG tablet Take 1 tablet (180 mg total) by mouth daily. (Patient taking differently: Take 180 mg by mouth daily as needed. ) 30 tablet 5  . cetirizine (ZYRTEC) 10 MG tablet Take 10 mg by mouth 2 (two) times daily.    Marland Kitchen dicyclomine (BENTYL) 10 MG capsule Take 1 capsule (10 mg total) by mouth 4 (four) times daily -  before meals and at bedtime. (Patient not taking: Reported on 12/26/2015) 120 capsule 2  . metFORMIN (GLUCOPHAGE) 500 MG tablet Take 2 tablets (1,000 mg total) by mouth 2 (two) times daily with a meal. (Patient not taking: Reported on 04/02/2016) 60 tablet 11  . montelukast (SINGULAIR) 10 MG tablet TAKE 1 TABLET AT BEDTIME (Patient not taking:  Reported on 12/26/2015) 90 tablet 3  . predniSONE (DELTASONE) 10 MG tablet 6 tablets on Day 1 , then reduce by 1 tablet daily until gone (Patient not taking: Reported on 04/02/2016) 21 tablet 0  . traMADol (ULTRAM) 50 MG tablet Take 1 tablet (50 mg total) by mouth every 8 (eight) hours as needed. (Patient not taking: Reported on 04/02/2016) 60 tablet 0   No facility-administered medications prior to visit.     Review of Systems;  Patient denies headache, fevers, malaise, unintentional weight loss, skin rash, eye pain, sinus congestion and sinus pain, sore throat, dysphagia,  hemoptysis  , cough, dyspnea, wheezing, chest pain, palpitations, orthopnea, edema, abdominal pain, nausea, melena, diarrhea, constipation, flank pain, dysuria, hematuria, urinary  Frequency, nocturia, numbness, tingling, seizures,  Focal weakness, Loss of consciousness,  Tremor, insomnia, depression, anxiety, and suicidal ideation.      Objective:  BP 118/72 (BP Location: Left Arm, Patient Position: Sitting, Cuff Size: Large)   Pulse 68   Resp 16   Ht 5\' 3"  (1.6 m)   Wt 216 lb (98 kg)   SpO2 95%   BMI 38.26 kg/m   BP Readings from Last 3 Encounters:  04/02/16 118/72  12/26/15 126/74  09/22/15 106/70    Wt Readings from Last 3 Encounters:  04/02/16 216 lb (98 kg)  12/26/15 210 lb 8 oz (95.5 kg)  09/22/15 213 lb 8 oz (96.8 kg)    General appearance: alert, cooperative and appears stated age Neck: no adenopathy, no carotid bruit, supple, symmetrical, trachea midline and thyroid not enlarged, symmetric, no tenderness/mass/nodules Lungs: clear to auscultation bilaterally Heart: regular rate and rhythm, S1, S2 normal, no murmur, click, rub or gallop Abdomen: soft, non-tender; bowel sounds normal; no masses,  no organomegaly Pulses: 2+ and symmetric Skin: Skin color, texture, turgor normal. No rashes or lesions Lymph nodes: Cervical, supraclavicular, and axillary nodes normal. Foot exam:  Nails are well trimmed,  No callouses,  Sensation intact to microfilament  Lab Results  Component Value Date   HGBA1C 7.1 (H) 03/29/2016   HGBA1C 6.2 12/23/2015   HGBA1C 5.9 08/27/2015    Lab Results  Component Value Date   CREATININE 0.78 03/29/2016   CREATININE 0.88 12/23/2015   CREATININE 0.79 08/27/2015    Lab Results  Component Value Date   WBC 6.6 01/27/2015   HGB 14.8 01/27/2015   HCT 42.4 01/27/2015   PLT 239 01/27/2015   GLUCOSE 174 (H) 03/29/2016   CHOL 214 (H) 03/29/2016   TRIG 204.0 (H) 03/29/2016   HDL 45.40 03/29/2016   LDLDIRECT 130.0 03/29/2016   LDLCALC 125 10/12/2013    ALT 31 03/29/2016   AST 22 03/29/2016   NA 144 03/29/2016   K 4.3 03/29/2016   CL 105 03/29/2016   CREATININE 0.78 03/29/2016   BUN 14 03/29/2016   CO2 25 03/29/2016   TSH 1.66 03/29/2016   HGBA1C 7.1 (H) 03/29/2016   MICROALBUR 1.5 03/29/2016    Mr Abdomen Wo Contrast  Result Date: 10/07/2015 CLINICAL DATA:  Pain and upper abdomen years xiphoid process. EXAM: MRI ABDOMEN WITHOUT CONTRAST TECHNIQUE: Multiplanar multisequence MR imaging was performed without the administration of intravenous contrast. COMPARISON:  11/25/14 FINDINGS: Lower chest: No pleural or pericardial effusion identified. Hepatobiliary: Marked diffuse hepatic steatosis. No focal liver abnormality. Previous cholecystectomy. Increase caliber of the extrahepatic common bile duct which measures 11 mm. No obstructing stone or mass. Pancreas:  Normal appearance of the pancreas. Spleen:  Normal appearance of the spleen. Adrenals/Urinary Tract: The  adrenal glands appear normal. Cyst in the right kidney measures 1.5 cm. No hydronephrosis or kidney mass identified. Stomach/Bowel: The stomach appears normal. No pathologic dilatation of the upper abdominal bowel loops. Vascular/Lymphatic: Normal appearance of the abdominal aorta. No upper abdominal adenopathy. Other:  No free fluid or fluid collections identified. Musculoskeletal: No suspicious bone lesions identified.No abnormal signal identified. IMPRESSION: 1. No acute findings and no explanation for patient's upper abdominal pain. 2. Right kidney cysts. 3. Previous cholecystectomy. Electronically Signed   By: Kerby Moors M.D.   On: 10/07/2015 10:23    Assessment & Plan:   Problem List Items Addressed This Visit    Diabetes mellitus without complication ([place])    Currently well-controlled on glipizide  .  hemoglobin A1c is slightly higher at 7.1.   . Patient is reminded to schedule an annual eye exam and foot exam is normal today. Patient has minimal  microalbuminuria. Patient is  tolerating Red Yeast Rice  For management of hyperlipidemia.  Advised to start a baby aspirin daily .  Lab Results  Component Value Date   HGBA1C 7.1 (H) 03/29/2016   Lab Results  Component Value Date   MICROALBUR 1.5 03/29/2016         Hyperlipidemia with target LDL less than 70    She is aware of her increased  risk of clinically significant Coronary artery disease to 19% over the next 10 years, despite  using Red Yeast Rice (calculation done using the  the Framingham risk calculator).   She prefers to avoid statin therapy .  Encouraged to take baby aspirin today   Lab Results  Component Value Date   CHOL 214 (H) 03/29/2016   HDL 45.40 03/29/2016   LDLCALC 125 10/12/2013   LDLDIRECT 130.0 03/29/2016   TRIG 204.0 (H) 03/29/2016   CHOLHDL 5 03/29/2016         Lymphedema of leg    Managed with compression garments and daily pumping.       Morbid obesity (HCC)    BMI is over 35 and she has Type 2 DM.   She has regained her weight because of lack of exercise and diminished adherence to low gi diet.  I have  encouraged her to renew her efforts at weight loss with goal of 10% of body weigh over the next 6 months using a low glycemic index diet and regular exercise a minimum of 5 days per week.         Other Visit Diagnoses    Breast cancer screening    -  Primary   Relevant Orders   MM SCREENING BREAST TOMO BILATERAL      I have discontinued Ms. Von Hassel Davies's dicyclomine, montelukast, metFORMIN, cetirizine, predniSONE, and traMADol. I am also having her maintain her EPINEPHrine, fluticasone, meclizine, fexofenadine, Red Yeast Rice Extract, hyoscyamine, saccharomyces boulardii, CVS D3, triamterene-hydrochlorothiazide, ergocalciferol, and glipiZIDE.  No orders of the defined types were placed in this encounter.   Medications Discontinued During This Encounter  Medication Reason  . cetirizine (ZYRTEC) 10 MG tablet Patient has not taken in last 30 days  . dicyclomine  (BENTYL) 10 MG capsule Patient has not taken in last 30 days  . metFORMIN (GLUCOPHAGE) 500 MG tablet Patient has not taken in last 30 days  . montelukast (SINGULAIR) 10 MG tablet Patient has not taken in last 30 days  . predniSONE (DELTASONE) 10 MG tablet Therapy completed  . traMADol (ULTRAM) 50 MG tablet Patient has not taken in last 30  days    Follow-up: No Follow-up on file.   Crecencio Mc, MD

## 2016-04-04 NOTE — Assessment & Plan Note (Signed)
Managed with compression garments and daily pumping.

## 2016-04-04 NOTE — Assessment & Plan Note (Signed)
Currently well-controlled on glipizide  .  hemoglobin A1c is slightly higher at 7.1.   . Patient is reminded to schedule an annual eye exam and foot exam is normal today. Patient has minimal  microalbuminuria. Patient is tolerating Red Yeast Rice  For management of hyperlipidemia.  Advised to start a baby aspirin daily .  Lab Results  Component Value Date   HGBA1C 7.1 (H) 03/29/2016   Lab Results  Component Value Date   MICROALBUR 1.5 03/29/2016

## 2016-04-04 NOTE — Assessment & Plan Note (Addendum)
BMI is over 35 and she has Type 2 DM.   She has regained her weight because of lack of exercise and diminished adherence to low gi diet.  I have  encouraged her to renew her efforts at weight loss with goal of 10% of body weigh over the next 6 months using a low glycemic index diet and regular exercise a minimum of 5 days per week.

## 2016-04-04 NOTE — Assessment & Plan Note (Signed)
She is aware of her increased  risk of clinically significant Coronary artery disease to 19% over the next 10 years, despite  using Red Yeast Rice (calculation done using the  the Framingham risk calculator).   She prefers to avoid statin therapy .  Encouraged to take baby aspirin today   Lab Results  Component Value Date   CHOL 214 (H) 03/29/2016   HDL 45.40 03/29/2016   LDLCALC 125 10/12/2013   LDLDIRECT 130.0 03/29/2016   TRIG 204.0 (H) 03/29/2016   CHOLHDL 5 03/29/2016

## 2016-04-05 NOTE — Telephone Encounter (Signed)
Please advise 

## 2016-04-07 NOTE — Telephone Encounter (Signed)
Fasting lab ordered

## 2016-04-21 ENCOUNTER — Encounter: Payer: Self-pay | Admitting: Internal Medicine

## 2016-05-10 ENCOUNTER — Other Ambulatory Visit: Payer: Self-pay | Admitting: Internal Medicine

## 2016-05-12 ENCOUNTER — Encounter: Payer: Self-pay | Admitting: Internal Medicine

## 2016-05-12 ENCOUNTER — Other Ambulatory Visit: Payer: Self-pay | Admitting: Internal Medicine

## 2016-05-12 NOTE — Telephone Encounter (Signed)
Spoke with pt and she stated that she has finally gotten the rx.

## 2016-05-12 NOTE — Telephone Encounter (Signed)
Pt called requesting these refills. Pt does not have any medication left. Please advise, thank you!  Call pt @ [facility], [place].

## 2016-05-14 MED ORDER — GLIPIZIDE 5 MG PO TABS: 5.0000 mg | ORAL_TABLET | Freq: Two times a day (BID) | ORAL | 0 refills | Status: DC

## 2016-05-17 DIAGNOSIS — T783XXA Angioneurotic edema, initial encounter: Secondary | ICD-10-CM | POA: Diagnosis not present

## 2016-05-17 DIAGNOSIS — H8111 Benign paroxysmal vertigo, right ear: Secondary | ICD-10-CM | POA: Diagnosis not present

## 2016-05-17 DIAGNOSIS — R42 Dizziness and giddiness: Secondary | ICD-10-CM | POA: Diagnosis not present

## 2016-06-15 ENCOUNTER — Encounter: Payer: Self-pay | Admitting: Internal Medicine

## 2016-06-15 DIAGNOSIS — E119 Type 2 diabetes mellitus without complications: Secondary | ICD-10-CM

## 2016-06-16 MED ORDER — GLIPIZIDE 10 MG PO TABS: 10.0000 mg | ORAL_TABLET | Freq: Two times a day (BID) | ORAL | 3 refills | Status: DC

## 2016-06-17 MED ORDER — GLIPIZIDE 10 MG PO TABS: 10.0000 mg | ORAL_TABLET | Freq: Two times a day (BID) | ORAL | 3 refills | Status: DC

## 2016-06-17 NOTE — Addendum Note (Signed)
Addended by: Bevelyn Ngo on: 06/17/2016 12:43 PM   Modules accepted: Orders

## 2016-06-29 ENCOUNTER — Other Ambulatory Visit: Payer: BLUE CROSS/BLUE SHIELD

## 2016-06-29 ENCOUNTER — Other Ambulatory Visit (INDEPENDENT_AMBULATORY_CARE_PROVIDER_SITE_OTHER): Payer: BLUE CROSS/BLUE SHIELD

## 2016-06-29 DIAGNOSIS — E119 Type 2 diabetes mellitus without complications: Secondary | ICD-10-CM | POA: Diagnosis not present

## 2016-06-29 DIAGNOSIS — E785 Hyperlipidemia, unspecified: Secondary | ICD-10-CM | POA: Diagnosis not present

## 2016-06-29 LAB — COMPREHENSIVE METABOLIC PANEL
ALK PHOS: 99 U/L (ref 39–117)
ALT: 35 U/L (ref 0–35)
AST: 25 U/L (ref 0–37)
Albumin: 4 g/dL (ref 3.5–5.2)
BILIRUBIN TOTAL: 0.6 mg/dL (ref 0.2–1.2)
BUN: 19 mg/dL (ref 6–23)
CALCIUM: 9.5 mg/dL (ref 8.4–10.5)
CHLORIDE: 103 meq/L (ref 96–112)
CO2: 28 mEq/L (ref 19–32)
CREATININE: 0.81 mg/dL (ref 0.40–1.20)
GFR: 77.13 mL/min (ref >60.00)
Glucose, Bld: 193 mg/dL — ABNORMAL HIGH (ref 70–99)
Potassium: 3.9 mEq/L (ref 3.5–5.1)
SODIUM: 140 meq/L (ref 135–145)
TOTAL PROTEIN: 7 g/dL (ref 6.0–8.3)

## 2016-06-29 LAB — LIPID PANEL
CHOLESTEROL: 200 mg/dL (ref 0–200)
HDL: 44.6 mg/dL (ref >39.00)
NonHDL: 155.21
Total CHOL/HDL Ratio: 4
Triglycerides: 223 mg/dL — ABNORMAL HIGH (ref 0.0–149.0)
VLDL: 44.6 mg/dL — AB (ref 0.0–40.0)

## 2016-06-29 LAB — MICROALBUMIN / CREATININE URINE RATIO
Creatinine,U: 162 mg/dL
MICROALB UR: 1.7 mg/dL (ref 0.0–1.9)
MICROALB/CREAT RATIO: 1 mg/g (ref 0.0–30.0)

## 2016-06-29 LAB — LDL CHOLESTEROL, DIRECT: Direct LDL: 116 mg/dL

## 2016-06-29 LAB — HEMOGLOBIN A1C: HEMOGLOBIN A1C: 7.9 % — AB (ref 4.6–6.5)

## 2016-07-01 ENCOUNTER — Encounter: Payer: Self-pay | Admitting: Internal Medicine

## 2016-07-02 ENCOUNTER — Ambulatory Visit (INDEPENDENT_AMBULATORY_CARE_PROVIDER_SITE_OTHER): Payer: BLUE CROSS/BLUE SHIELD | Admitting: Internal Medicine

## 2016-07-02 ENCOUNTER — Encounter: Payer: Self-pay | Admitting: Internal Medicine

## 2016-07-02 DIAGNOSIS — F338 Other recurrent depressive disorders: Secondary | ICD-10-CM

## 2016-07-02 DIAGNOSIS — F339 Major depressive disorder, recurrent, unspecified: Secondary | ICD-10-CM | POA: Diagnosis not present

## 2016-07-02 DIAGNOSIS — Z90710 Acquired absence of both cervix and uterus: Secondary | ICD-10-CM

## 2016-07-02 DIAGNOSIS — Z91018 Allergy to other foods: Secondary | ICD-10-CM

## 2016-07-02 DIAGNOSIS — K582 Mixed irritable bowel syndrome: Secondary | ICD-10-CM

## 2016-07-02 DIAGNOSIS — E118 Type 2 diabetes mellitus with unspecified complications: Secondary | ICD-10-CM | POA: Diagnosis not present

## 2016-07-02 NOTE — Progress Notes (Signed)
Subjective:  Patient ID: Barbara Wade, female    DOB: 1960-04-15  Age: 56 y.o. MRN: [id number]  CC: Diagnoses of Type 2 diabetes mellitus with complication, without long-term current use of insulin ([place]), Seasonal affective disorder ([place]), Morbid obesity ([place]), Irritable bowel syndrome with both constipation and diarrhea, S/P abdominal hysterectomy, and History of food anaphylaxis were pertinent to this visit.  HPI Barbara Wade presents for follow up on uncontrolled Type 2 DM complicated boy obesity and    Increased glipizide to 10  Mg bid a week for ago BX > 200  Now under 200     Not sleeping  Well,  Initiation takes hours ,. Using lavender oil.   Still having episodes of multiple bowel movements daily , progressively softer,  Occurring twice weekly    Outpatient Medications Prior to Visit  Medication Sig Dispense Refill  . CVS D3 1000 units capsule TAKE 2 CAPSULES BY MOUTH DAILY 120 capsule 1  . EPINEPHrine (EPIPEN) 0.3 mg/0.3 mL DEVI Inject 0.3 mLs (0.3 mg total) into the muscle once. 1 Device 6  . ergocalciferol (DRISDOL) 50000 units capsule Take 1 capsule (50,000 Units total) by mouth once a week. 4 capsule 2  . fluticasone (FLONASE) 50 MCG/ACT nasal spray Place 2 sprays into the nose daily as needed.     Marland Kitchen glipiZIDE (GLUCOTROL) 10 MG tablet Take 1 tablet (10 mg total) by mouth 2 (two) times daily before a meal. 60 tablet 3  . hyoscyamine (ANASPAZ) 0.125 MG TBDP disintergrating tablet Place 1 tablet (0.125 mg total) under the tongue every 4 (four) hours as needed for cramping. 60 tablet 0  . meclizine (ANTIVERT) 25 MG tablet TAKE 1 TABLET BY MOUTH TWICE DAILY AS NEEDED. 30 tablet 4  . Red Yeast Rice Extract (CVS RED YEAST RICE) 600 MG CAPS Take 1 capsule (600 mg total) by mouth 2 (two) times daily after a meal. 180 capsule 1  . saccharomyces boulardii (FLORASTOR) 250 MG capsule Take 250 mg by mouth daily.    Marland Kitchen triamterene-hydrochlorothiazide (MAXZIDE-25)  37.5-25 MG tablet Take 1 tablet by mouth daily. 90 tablet 2  . fexofenadine (ALLEGRA) 180 MG tablet Take 1 tablet (180 mg total) by mouth daily. (Patient taking differently: Take 180 mg by mouth daily as needed. ) 30 tablet 5   No facility-administered medications prior to visit.     Review of Systems;  Patient denies headache, fevers, malaise, unintentional weight loss, skin rash, eye pain, sinus congestion and sinus pain, sore throat, dysphagia,  hemoptysis , cough, dyspnea, wheezing, chest pain, palpitations, orthopnea, edema, abdominal pain, nausea, melena, diarrhea, constipation, flank pain, dysuria, hematuria, urinary  Frequency, nocturia, numbness, tingling, seizures,  Focal weakness, Loss of consciousness,  Tremor, insomnia, depression, anxiety, and suicidal ideation.      Objective:  BP 110/68 (BP Location: Left Arm, Patient Position: Sitting, Cuff Size: Large)   Pulse 74   Temp 98 F (36.7 C) (Oral)   Resp 17   Ht 5\' 3"  (1.6 m)   Wt 214 lb 12.8 oz (97.4 kg)   SpO2 94%   BMI 38.05 kg/m   BP Readings from Last 3 Encounters:  07/02/16 110/68  04/02/16 118/72  12/26/15 126/74    Wt Readings from Last 3 Encounters:  07/02/16 214 lb 12.8 oz (97.4 kg)  04/02/16 216 lb (98 kg)  12/26/15 210 lb 8 oz (95.5 kg)    General appearance: alert, cooperative and appears stated age Ears: normal TM's and external  ear canals both ears Throat: lips, mucosa, and tongue normal; teeth and gums normal Neck: no adenopathy, no carotid bruit, supple, symmetrical, trachea midline and thyroid not enlarged, symmetric, no tenderness/mass/nodules Back: symmetric, no curvature. ROM normal. No CVA tenderness. Lungs: clear to auscultation bilaterally Heart: regular rate and rhythm, S1, S2 normal, no murmur, click, rub or gallop Abdomen: soft, non-tender; bowel sounds normal; no masses,  no organomegaly Pulses: 2+ and symmetric Skin: Skin color, texture, turgor normal. No rashes or lesions Lymph  nodes: Cervical, supraclavicular, and axillary nodes normal.  Lab Results  Component Value Date   HGBA1C 7.9 (H) 06/29/2016   HGBA1C 7.1 (H) 03/29/2016   HGBA1C 6.2 12/23/2015    Lab Results  Component Value Date   CREATININE 0.81 06/29/2016   CREATININE 0.78 03/29/2016   CREATININE 0.88 12/23/2015    Lab Results  Component Value Date   WBC 6.6 01/27/2015   HGB 14.8 01/27/2015   HCT 42.4 01/27/2015   PLT 239 01/27/2015   GLUCOSE 193 (H) 06/29/2016   CHOL 200 06/29/2016   TRIG 223.0 (H) 06/29/2016   HDL 44.60 06/29/2016   LDLDIRECT 116.0 06/29/2016   LDLCALC 125 10/12/2013   ALT 35 06/29/2016   AST 25 06/29/2016   NA 140 06/29/2016   K 3.9 06/29/2016   CL 103 06/29/2016   CREATININE 0.81 06/29/2016   BUN 19 06/29/2016   CO2 28 06/29/2016   TSH 1.66 03/29/2016   HGBA1C 7.9 (H) 06/29/2016   MICROALBUR 1.7 06/29/2016    Mr Abdomen Wo Contrast  Result Date: 10/07/2015 CLINICAL DATA:  Pain and upper abdomen years xiphoid process. EXAM: MRI ABDOMEN WITHOUT CONTRAST TECHNIQUE: Multiplanar multisequence MR imaging was performed without the administration of intravenous contrast. COMPARISON:  11/25/14 FINDINGS: Lower chest: No pleural or pericardial effusion identified. Hepatobiliary: Marked diffuse hepatic steatosis. No focal liver abnormality. Previous cholecystectomy. Increase caliber of the extrahepatic common bile duct which measures 11 mm. No obstructing stone or mass. Pancreas:  Normal appearance of the pancreas. Spleen:  Normal appearance of the spleen. Adrenals/Urinary Tract: The adrenal glands appear normal. Cyst in the right kidney measures 1.5 cm. No hydronephrosis or kidney mass identified. Stomach/Bowel: The stomach appears normal. No pathologic dilatation of the upper abdominal bowel loops. Vascular/Lymphatic: Normal appearance of the abdominal aorta. No upper abdominal adenopathy. Other:  No free fluid or fluid collections identified. Musculoskeletal: No suspicious  bone lesions identified.No abnormal signal identified. IMPRESSION: 1. No acute findings and no explanation for patient's upper abdominal pain. 2. Right kidney cysts. 3. Previous cholecystectomy. Electronically Signed   By: Kerby Moors M.D.   On: 10/07/2015 10:23    Assessment & Plan:   Problem List Items Addressed This Visit    Type 2 diabetes mellitus with complication, without long-term current use of insulin (HCC)    Loss of control secondary to decreased adherence to lifestyle .  Glipizide dose recentl increased by pateint due to fasting sugars > 200.  Will reassess in 2 weeks and recommend victoza     Lab Results  Component Value Date   HGBA1C 7.9 (H) 06/29/2016   Lab Results  Component Value Date   MICROALBUR 1.7 06/29/2016         RESOLVED: Seasonal affective disorder ([place])   S/P abdominal hysterectomy    Total.  For non cancerous reasons.       Morbid obesity (HCC)    BMI is over 35 and she has Type 2 DM.   She has regained her weight  because of lack of exercise and diminished adherence to low gi diet.  I have  encouraged her to renew her efforts at weight loss with goal of 10% of body weigth over the next 6 months using a low glycemic index diet , addition of Victoza for diabetes management , and regular exercise a minimum of 5 days per week.        Irritable bowel syndrome (IBS)    constipation predominant, despite a daily bowel regimen that includes  A BFL.  Advised to consider Linzess      History of food anaphylaxis    To plums and prines.  Has EPI pen          I am having Ms. Von Amie Wade maintain her EPINEPHrine, fluticasone, fexofenadine, Red Yeast Rice Extract, hyoscyamine, saccharomyces boulardii, CVS D3, triamterene-hydrochlorothiazide, ergocalciferol, meclizine, and glipiZIDE.  No orders of the defined types were placed in this encounter.   There are no discontinued medications.  Follow-up: Return in about 3 months (around 10/02/2016) for  follow up diabetes.   Crecencio Mc, MD

## 2016-07-02 NOTE — Patient Instructions (Addendum)
Continue glipoizide 10 mg twice daily.  After 2 weeks,  Call if:  fastings are > 140 consistently  Post prandials ar e > 160  consistently   We discussed starting Victoza if this happens   Think about Linzess or amitiza  To manage  Your iBS  Linaclotide oral capsules What is this medicine? LINACLOTIDE (lin a KLOE tide) is used to treat irritable bowel syndrome (IBS) with constipation as the main problem. It may also be used for relief of chronic constipation. This medicine may be used for other purposes; ask your health care provider or pharmacist if you have questions. COMMON BRAND NAME(S): Linzess What should I tell my health care provider before I take this medicine? They need to know if you have any of these conditions: -history of stool (fecal) impaction -now have diarrhea or have diarrhea often -other medical condition -stomach or intestinal disease, including bowel obstruction or abdominal adhesions -an unusual or allergic reaction to linaclotide, other medicines, foods, dyes, or preservatives -pregnant or trying to get pregnant -breast-feeding How should I use this medicine? Take this medicine by mouth with a glass of water. Follow the directions on the prescription label. Do not cut, crush or chew this medicine. Take on an empty stomach, at least 30 minutes before your first meal of the day. Take your medicine at regular intervals. Do not take your medicine more often than directed. Do not stop taking except on your doctor's advice. A special MedGuide will be given to you by the pharmacist with each prescription and refill. Be sure to read this information carefully each time. Talk to your pediatrician regarding the use of this medicine in children. This medicine is not approved for use in children. Overdosage: If you think you have taken too much of this medicine contact a poison control center or emergency room at once. NOTE: This medicine is only for you. Do not share this  medicine with others. What if I miss a dose? If you miss a dose, just skip that dose. Wait until your next dose, and take only that dose. Do not take double or extra doses. What may interact with this medicine? -certain medicines for bowel problems or bladder incontinence (these can cause constipation) This list may not describe all possible interactions. Give your health care provider a list of all the medicines, herbs, non-prescription drugs, or dietary supplements you use. Also tell them if you smoke, drink alcohol, or use illegal drugs. Some items may interact with your medicine. What should I watch for while using this medicine? Visit your doctor for regular check ups. Tell your doctor if your symptoms do not get better or if they get worse. Diarrhea is a common side effect of this medicine. It often begins within 2 weeks of starting this medicine. Stop taking this medicine and call your doctor if you get severe diarrhea. Stop taking this medicine and call your doctor or go to the nearest hospital emergency room right away if you develop unusual or severe stomach-area (abdominal) pain, especially if you also have bright red, bloody stools or black stools that look like tar. What side effects may I notice from receiving this medicine? Side effects that you should report to your doctor or health care professional as soon as possible: -allergic reactions like skin rash, itching or hives, swelling of the face, lips, or tongue -black, tarry stools -bloody or watery diarrhea -new or worsening stomach pain -severe or prolonged diarrhea Side effects that usually do not require  medical attention (report to your doctor or health care professional if they continue or are bothersome): -bloating -gas -loose stools This list may not describe all possible side effects. Call your doctor for medical advice about side effects. You may report side effects to FDA at 1-800-FDA-1088. Where should I keep my  medicine? Keep out of the reach of children. Store at room temperature between 20 and 25 degrees C (68 and 77 degrees F). Keep this medicine in the original container. Keep tightly closed in a dry place. Do not remove the desiccant packet from the bottle, it helps to protect your medicine from moisture. Throw away any unused medicine after the expiration date. NOTE: This sheet is a summary. It may not cover all possible information. If you have questions about this medicine, talk to your doctor, pharmacist, or health care provider.  2018 Elsevier/Gold Standard (2015-02-13 12:17:04)

## 2016-07-04 DIAGNOSIS — Z90710 Acquired absence of both cervix and uterus: Secondary | ICD-10-CM | POA: Insufficient documentation

## 2016-07-04 NOTE — Assessment & Plan Note (Signed)
Total.  For non cancerous reasons.

## 2016-07-04 NOTE — Assessment & Plan Note (Addendum)
Loss of control secondary to decreased adherence to lifestyle .  Glipizide dose recentl increased by pateint due to fasting sugars > 200.  Will reassess in 2 weeks and recommend victoza     Lab Results  Component Value Date   HGBA1C 7.9 (H) 06/29/2016   Lab Results  Component Value Date   MICROALBUR 1.7 06/29/2016

## 2016-07-04 NOTE — Assessment & Plan Note (Signed)
To plums and prines.  Has EPI pen

## 2016-07-04 NOTE — Assessment & Plan Note (Signed)
BMI is over 35 and she has Type 2 DM.   She has regained her weight because of lack of exercise and diminished adherence to low gi diet.  I have  encouraged her to renew her efforts at weight loss with goal of 10% of body weigth over the next 6 months using a low glycemic index diet , addition of Victoza for diabetes management , and regular exercise a minimum of 5 days per week.

## 2016-07-04 NOTE — Assessment & Plan Note (Signed)
constipation predominant, despite a daily bowel regimen that includes  A BFL.  Advised to consider Linzess

## 2016-07-15 ENCOUNTER — Other Ambulatory Visit: Payer: Self-pay | Admitting: Internal Medicine

## 2016-07-15 ENCOUNTER — Encounter: Payer: Self-pay | Admitting: Internal Medicine

## 2016-07-15 MED ORDER — SITAGLIPTIN PHOSPHATE 50 MG PO TABS: 50.0000 mg | ORAL_TABLET | Freq: Every day | ORAL | 1 refills | Status: DC

## 2016-07-16 ENCOUNTER — Other Ambulatory Visit: Payer: Self-pay | Admitting: Internal Medicine

## 2016-07-16 MED ORDER — PEN NEEDLES 31G X 6 MM MISC
0 refills | Status: DC
Start: 2016-07-16 — End: 2016-11-19

## 2016-07-16 MED ORDER — LIRAGLUTIDE 18 MG/3ML ~~LOC~~ SOPN: PEN_INJECTOR | SUBCUTANEOUS | 0 refills | Status: DC

## 2016-08-05 ENCOUNTER — Telehealth: Payer: Self-pay | Admitting: Internal Medicine

## 2016-08-05 NOTE — Telephone Encounter (Signed)
Barb from cover my meds called and had a couple of questions in regards to the PA you started for this pt. Please advise, thank you!  Call @ [phone] Ref # [id number]

## 2016-08-05 NOTE — Telephone Encounter (Signed)
Spoke with covermymeds and they stated that the name of the pt and the name on the pt's insurance  Card does not match so they can not do the PA. Called the pt and she stated that she is on her husbands insurance but that her name is not on the card. Pt stated that she would call her insurance and see what she could find out and then give [place] a call back.

## 2016-09-10 ENCOUNTER — Other Ambulatory Visit: Payer: Self-pay | Admitting: Internal Medicine

## 2016-09-11 ENCOUNTER — Other Ambulatory Visit: Payer: Self-pay | Admitting: Internal Medicine

## 2016-10-06 LAB — BASIC METABOLIC PANEL
BUN: 17 (ref 4–21)
Creatinine: 0.7 (ref 0.5–1.1)
Glucose: 107
POTASSIUM: 4.2 (ref 3.4–5.3)
Sodium: 142 (ref 137–147)

## 2016-10-06 LAB — LIPID PANEL
CHOLESTEROL: 197 (ref 0–200)
HDL: 42 (ref 35–70)
LDL Cholesterol: 124
Triglycerides: 154 (ref 40–160)

## 2016-10-06 LAB — CBC AND DIFFERENTIAL
HCT: 43 (ref 36–46)
HEMOGLOBIN: 15.1 (ref 12.0–16.0)
PLATELETS: 322 (ref 150–399)
WBC: 7.5

## 2016-10-06 LAB — TSH: TSH: 1.83 (ref 0.41–5.90)

## 2016-10-06 LAB — HEPATIC FUNCTION PANEL
ALK PHOS: 103 (ref 25–125)
ALT: 29 (ref 7–35)
AST: 29 (ref 13–35)
Bilirubin, Total: 0.6

## 2016-10-07 ENCOUNTER — Telehealth: Payer: Self-pay | Admitting: Radiology

## 2016-10-07 DIAGNOSIS — E118 Type 2 diabetes mellitus with unspecified complications: Secondary | ICD-10-CM

## 2016-10-07 DIAGNOSIS — E785 Hyperlipidemia, unspecified: Secondary | ICD-10-CM

## 2016-10-07 NOTE — Telephone Encounter (Signed)
Pt coming in Monday for labs, please place future orders. Thank you.  

## 2016-10-07 NOTE — Addendum Note (Signed)
Addended by: Crecencio Mc on: 10/07/2016 06:40 PM   Modules accepted: Orders

## 2016-10-08 ENCOUNTER — Telehealth: Payer: Self-pay | Admitting: Internal Medicine

## 2016-10-11 ENCOUNTER — Other Ambulatory Visit: Payer: BLUE CROSS/BLUE SHIELD

## 2016-10-14 ENCOUNTER — Ambulatory Visit: Payer: BLUE CROSS/BLUE SHIELD | Admitting: Internal Medicine

## 2016-10-20 ENCOUNTER — Other Ambulatory Visit: Payer: Self-pay

## 2016-10-20 NOTE — Telephone Encounter (Signed)
Error

## 2016-10-25 NOTE — Telephone Encounter (Signed)
Error

## 2016-10-26 ENCOUNTER — Other Ambulatory Visit: Payer: Self-pay | Admitting: Internal Medicine

## 2016-10-28 ENCOUNTER — Encounter: Payer: Self-pay | Admitting: Internal Medicine

## 2016-10-28 DIAGNOSIS — Z1239 Encounter for other screening for malignant neoplasm of breast: Secondary | ICD-10-CM

## 2016-11-02 DIAGNOSIS — Z1231 Encounter for screening mammogram for malignant neoplasm of breast: Secondary | ICD-10-CM | POA: Diagnosis not present

## 2016-11-02 LAB — HM MAMMOGRAPHY

## 2016-11-17 ENCOUNTER — Encounter: Payer: Self-pay | Admitting: Internal Medicine

## 2016-11-19 ENCOUNTER — Encounter: Payer: Self-pay | Admitting: Internal Medicine

## 2016-11-19 ENCOUNTER — Telehealth: Payer: Self-pay | Admitting: Internal Medicine

## 2016-11-19 ENCOUNTER — Ambulatory Visit (INDEPENDENT_AMBULATORY_CARE_PROVIDER_SITE_OTHER): Payer: BLUE CROSS/BLUE SHIELD | Admitting: Internal Medicine

## 2016-11-19 VITALS — BP 100/60 | HR 61 | Temp 98.5°F | Resp 15 | Ht 63.0 in | Wt 201.4 lb

## 2016-11-19 DIAGNOSIS — G4733 Obstructive sleep apnea (adult) (pediatric): Secondary | ICD-10-CM

## 2016-11-19 DIAGNOSIS — E785 Hyperlipidemia, unspecified: Secondary | ICD-10-CM

## 2016-11-19 DIAGNOSIS — Z9989 Dependence on other enabling machines and devices: Secondary | ICD-10-CM

## 2016-11-19 DIAGNOSIS — E118 Type 2 diabetes mellitus with unspecified complications: Secondary | ICD-10-CM | POA: Diagnosis not present

## 2016-11-19 LAB — POCT GLYCOSYLATED HEMOGLOBIN (HGB A1C): HEMOGLOBIN A1C: 5.8

## 2016-11-19 NOTE — Telephone Encounter (Signed)
Labs have been ordered. Lab appt has been scheduled and labs have been ordered.

## 2016-11-19 NOTE — Progress Notes (Signed)
Subjective:  Patient ID: Barbara Wade, female    DOB: 1960/02/19  Age: 56 y.o. MRN: [id number]  CC: The primary encounter diagnosis was Type 2 diabetes mellitus with complication, without long-term current use of insulin ([place]). A diagnosis of Morbid obesity ([place]) was also pertinent to this visit.  HPI Barbara Wade presents for 3 month follow up on diabetes.  Patient has no complaints today.  Patient is following a low glycemic index diet and taking all prescribed medications regularly with  RECURRENT HYPOGLYCEMIC EVENTS REPORTED.  Started a weight loss program on July 5th and has lost 22 lbs in 45 days.  Weight has stabilized .  Fast Track  Herbal supplements, plus diet.  Initially gluten free   Has decided to leave out gluten .  Green peppers increased her pain level.  Corn made her congested.   Has been off the diet  ,  taking the supplements.  Plans to restarte the 30 day program to lose more weight.      Fasting sugars have been under less than 140 most of the time and post prandials have been under 160 except on rare occasions. Patient is exercising about 3 times per week and intentionally trying to lose weight .  Patient has had an eye exam in the last 12 months and checks feet regularly for signs of infection.  Patient does not walk barefoot outside,  And denies an numbness tingling or burning in feet. Patient is up to date on all recommended vaccinations  Lab Results  Component Value Date   HGBA1C 5.8 11/19/2016    [facility] last [facility] weight is 201.   Labs done  in September were normal  Goal is 145 lbs.   Has not started exercising.   Really tired  Despite sleeping well . Wearing CPAP from [facility].  Outpatient Medications Prior to Visit  Medication Sig Dispense Refill  . CVS D3 1000 units capsule TAKE 2 CAPSULES BY MOUTH DAILY 120 capsule 1  . EPINEPHrine (EPIPEN) 0.3 mg/0.3 mL DEVI Inject 0.3 mLs (0.3 mg total) into the muscle once. 1 Device 6   . fluticasone (FLONASE) 50 MCG/ACT nasal spray Place 2 sprays into the nose daily as needed.     . hyoscyamine (ANASPAZ) 0.125 MG TBDP disintergrating tablet Place 1 tablet (0.125 mg total) under the tongue every 4 (four) hours as needed for cramping. 60 tablet 0  . meclizine (ANTIVERT) 25 MG tablet TAKE 1 TABLET BY MOUTH TWICE DAILY AS NEEDED. 30 tablet 4  . Red Yeast Rice Extract (CVS RED YEAST RICE) 600 MG CAPS Take 1 capsule (600 mg total) by mouth 2 (two) times daily after a meal. 180 capsule 1  . triamterene-hydrochlorothiazide (MAXZIDE-25) 37.5-25 MG tablet TAKE 1 TABLET BY MOUTH ONCE DAILY 210 tablet 1  . fexofenadine (ALLEGRA) 180 MG tablet Take 1 tablet (180 mg total) by mouth daily. (Patient taking differently: Take 180 mg by mouth daily as needed. ) 30 tablet 5  . ergocalciferol (DRISDOL) 50000 units capsule Take 1 capsule (50,000 Units total) by mouth once a week. (Patient not taking: Reported on 11/19/2016) 4 capsule 2  . glipiZIDE (GLUCOTROL) 10 MG tablet TAKE 1 TABLET BY MOUTH TWICE DAILY BEFORE A MEAL (Patient not taking: Reported on 11/19/2016) 60 tablet 3  . Insulin Pen Needle (PEN NEEDLES) 31G X 6 MM MISC For use with victoza /saxenda (Patient not taking: Reported on 11/19/2016) 100 each 0  .  liraglutide 18 MG/3ML SOPN Inject 0.6 mg SQ  daily Week 1; 1.2 mg daily Week 2; 1.8 mg daily Week 3 and thereafter (Patient not taking: Reported on 11/19/2016) 3 mL 0  . saccharomyces boulardii (FLORASTOR) 250 MG capsule Take 250 mg by mouth daily.     No facility-administered medications prior to visit.     Review of Systems;  Patient denies headache, fevers, malaise, unintentional weight loss, skin rash, eye pain, sinus congestion and sinus pain, sore throat, dysphagia,  hemoptysis , cough, dyspnea, wheezing, chest pain, palpitations, orthopnea, edema, abdominal pain, nausea, melena, diarrhea, constipation, flank pain, dysuria, hematuria, urinary  Frequency, nocturia, numbness, tingling,  seizures,  Focal weakness, Loss of consciousness,  Tremor, insomnia, depression, anxiety, and suicidal ideation.      Objective:  BP 100/60 (BP Location: Left Arm, Patient Position: Sitting, Cuff Size: Large)   Pulse 61   Temp 98.5 F (36.9 C) (Oral)   Resp 15   Ht 5\' 3"  (1.6 m)   Wt 201 lb 6.4 oz (91.4 kg)   SpO2 95%   BMI 35.68 kg/m   BP Readings from Last 3 Encounters:  11/19/16 100/60  07/02/16 110/68  04/02/16 118/72    Wt Readings from Last 3 Encounters:  11/19/16 201 lb 6.4 oz (91.4 kg)  07/02/16 214 lb 12.8 oz (97.4 kg)  04/02/16 216 lb (98 kg)    General appearance: alert, cooperative and appears stated age Ears: normal TM's and external ear canals both ears Throat: lips, mucosa, and tongue normal; teeth and gums normal Neck: no adenopathy, no carotid bruit, supple, symmetrical, trachea midline and thyroid not enlarged, symmetric, no tenderness/mass/nodules Back: symmetric, no curvature. ROM normal. No CVA tenderness. Lungs: clear to auscultation bilaterally Heart: regular rate and rhythm, S1, S2 normal, no murmur, click, rub or gallop Abdomen: soft, non-tender; bowel sounds normal; no masses,  no organomegaly Pulses: 2+ and symmetric Skin: Skin color, texture, turgor normal. No rashes or lesions Lymph nodes: Cervical, supraclavicular, and axillary nodes normal.  Lab Results  Component Value Date   HGBA1C 5.8 11/19/2016   HGBA1C 7.9 (H) 06/29/2016   HGBA1C 7.1 (H) 03/29/2016    Lab Results  Component Value Date   CREATININE 0.7 10/06/2016   CREATININE 0.81 06/29/2016   CREATININE 0.78 03/29/2016    Lab Results  Component Value Date   WBC 7.5 10/06/2016   HGB 15.1 10/06/2016   HCT 43 10/06/2016   PLT 322 10/06/2016   GLUCOSE 193 (H) 06/29/2016   CHOL 197 10/06/2016   TRIG 154 10/06/2016   HDL 42 10/06/2016   LDLDIRECT 116.0 06/29/2016   LDLCALC 124 10/06/2016   ALT 29 10/06/2016   AST 29 10/06/2016   NA 142 10/06/2016   K 4.2 10/06/2016    CL 103 06/29/2016   CREATININE 0.7 10/06/2016   BUN 17 10/06/2016   CO2 28 06/29/2016   TSH 1.83 10/06/2016   HGBA1C 5.8 11/19/2016   MICROALBUR 1.7 06/29/2016    Assessment & Plan:   Problem List Items Addressed This Visit    Morbid obesity ([place])    I have congratulated her in reduction of   BMI and encouraged  Continued weight loss with goal of 10% of body weigh over the next 6 months using a low glycemic index diet and regular exercise a minimum of 5 days per week.        Relevant Medications   glipiZIDE (GLUCOTROL) 5 MG tablet   Type 2 diabetes mellitus with complication, without  long-term current use of insulin ([facility]) - Primary     Excellent control following adherence to lifestyle changes Addressed lows with reduction in glipizide dose to 5 mg bid due to recurrent low blood sugars.   Lab Results  Component Value Date   HGBA1C 5.8 11/19/2016   Lab Results  Component Value Date   MICROALBUR 1.7 06/29/2016         Relevant Medications   glipiZIDE (GLUCOTROL) 5 MG tablet   Other Relevant Orders   POCT HgB A1C (Completed)      I have discontinued Ms. Von Hassel Davies's saccharomyces boulardii, ergocalciferol, liraglutide, and Pen Needles. I am also having her maintain her EPINEPHrine, fluticasone, fexofenadine, Red Yeast Rice Extract, hyoscyamine, CVS D3, meclizine, triamterene-hydrochlorothiazide, glipiZIDE, Milk Thistle, and Lactobacillus (PROBIOTIC ACIDOPHILUS PO).  Meds ordered this encounter  Medications  . glipiZIDE (GLUCOTROL) 5 MG tablet    Sig: Take 5 mg by mouth daily before breakfast.  . Milk Thistle 150 MG CAPS    Sig: Take 1 capsule by mouth 2 (two) times daily.  . Lactobacillus (PROBIOTIC ACIDOPHILUS PO)    Sig: Take 1 capsule by mouth 2 (two) times daily.    Medications Discontinued During This Encounter  Medication Reason  . ergocalciferol (DRISDOL) 50000 units capsule Completed Course  . glipiZIDE (GLUCOTROL) 10 MG tablet Patient has not taken  in last 30 days  . Insulin Pen Needle (PEN NEEDLES) 31G X 6 MM MISC Patient has not taken in last 30 days  . liraglutide 18 MG/3ML SOPN Patient has not taken in last 30 days  . saccharomyces boulardii (FLORASTOR) 250 MG capsule Patient has not taken in last 30 days    Follow-up: No Follow-up on file.   Crecencio Mc, MD

## 2016-11-19 NOTE — Telephone Encounter (Signed)
Pt would like to get labs done before next appt. Need orders please and thank you!

## 2016-11-19 NOTE — Patient Instructions (Addendum)
Congratulations!   Your have reduced your A1c to 5.8   Reduce the morning and evening  doses of glipziide to 5 mg .      If you continue to have low late afternoon BS,  Stop the morning dose of medication   If you have low morning sugars.  Stop the evening dose   Your fatigue may be due to incorrect CPAP settings .  We will contact [facility]  Start exercising!  It will give you energy

## 2016-11-21 NOTE — Assessment & Plan Note (Signed)
I have congratulated her in reduction of   BMI and encouraged  Continued weight loss with goal of 10% of body weigh over the next 6 months using a low glycemic index diet and regular exercise a minimum of 5 days per week.    

## 2016-11-21 NOTE — Assessment & Plan Note (Signed)
Excellent control following adherence to lifestyle changes Addressed lows with reduction in glipizide dose to 5 mg bid due to recurrent low blood sugars.   Lab Results  Component Value Date   HGBA1C 5.8 11/19/2016   Lab Results  Component Value Date   MICROALBUR 1.7 06/29/2016

## 2016-11-23 ENCOUNTER — Encounter: Payer: Self-pay | Admitting: Internal Medicine

## 2016-11-24 ENCOUNTER — Other Ambulatory Visit: Payer: Self-pay | Admitting: Internal Medicine

## 2016-11-24 ENCOUNTER — Telehealth: Payer: Self-pay | Admitting: Internal Medicine

## 2016-11-24 NOTE — Addendum Note (Signed)
Addended by: Crecencio Mc on: 11/24/2016 10:22 AM   Modules accepted: Orders

## 2016-11-24 NOTE — Telephone Encounter (Signed)
Faxed order for auto titration adjustment to [facility] and notified patient of order being faxed. Patient agreed and is aware.

## 2016-11-26 ENCOUNTER — Encounter: Payer: Self-pay | Admitting: Internal Medicine

## 2017-01-19 ENCOUNTER — Encounter: Payer: Self-pay | Admitting: Internal Medicine

## 2017-01-19 MED ORDER — TRIAMTERENE-HCTZ 37.5-25 MG PO TABS: 1.0000 | ORAL_TABLET | Freq: Every day | ORAL | 0 refills | Status: DC

## 2017-01-19 MED ORDER — HYOSCYAMINE SULFATE 0.125 MG PO TBDP: 0.1250 mg | ORAL_TABLET | ORAL | 0 refills | Status: DC | PRN

## 2017-02-15 ENCOUNTER — Other Ambulatory Visit: Payer: BLUE CROSS/BLUE SHIELD

## 2017-02-21 ENCOUNTER — Ambulatory Visit: Payer: BLUE CROSS/BLUE SHIELD | Admitting: Internal Medicine

## 2017-08-01 IMAGING — CR DG CHEST 2V
1 series · 2 of 2 positions shown · non-contrast
Comparison: 11/03/2012

CLINICAL DATA: High blood sugar but not diabetic, cough since [DATE][REDACTED], no other lung/ heart symptoms or surgeries per pt, shielded

EXAM:
CHEST  2 VIEW

[Series 1: w chest pa · 0.14mm/px · 2 of 2 slices shown]
[im 1/2]
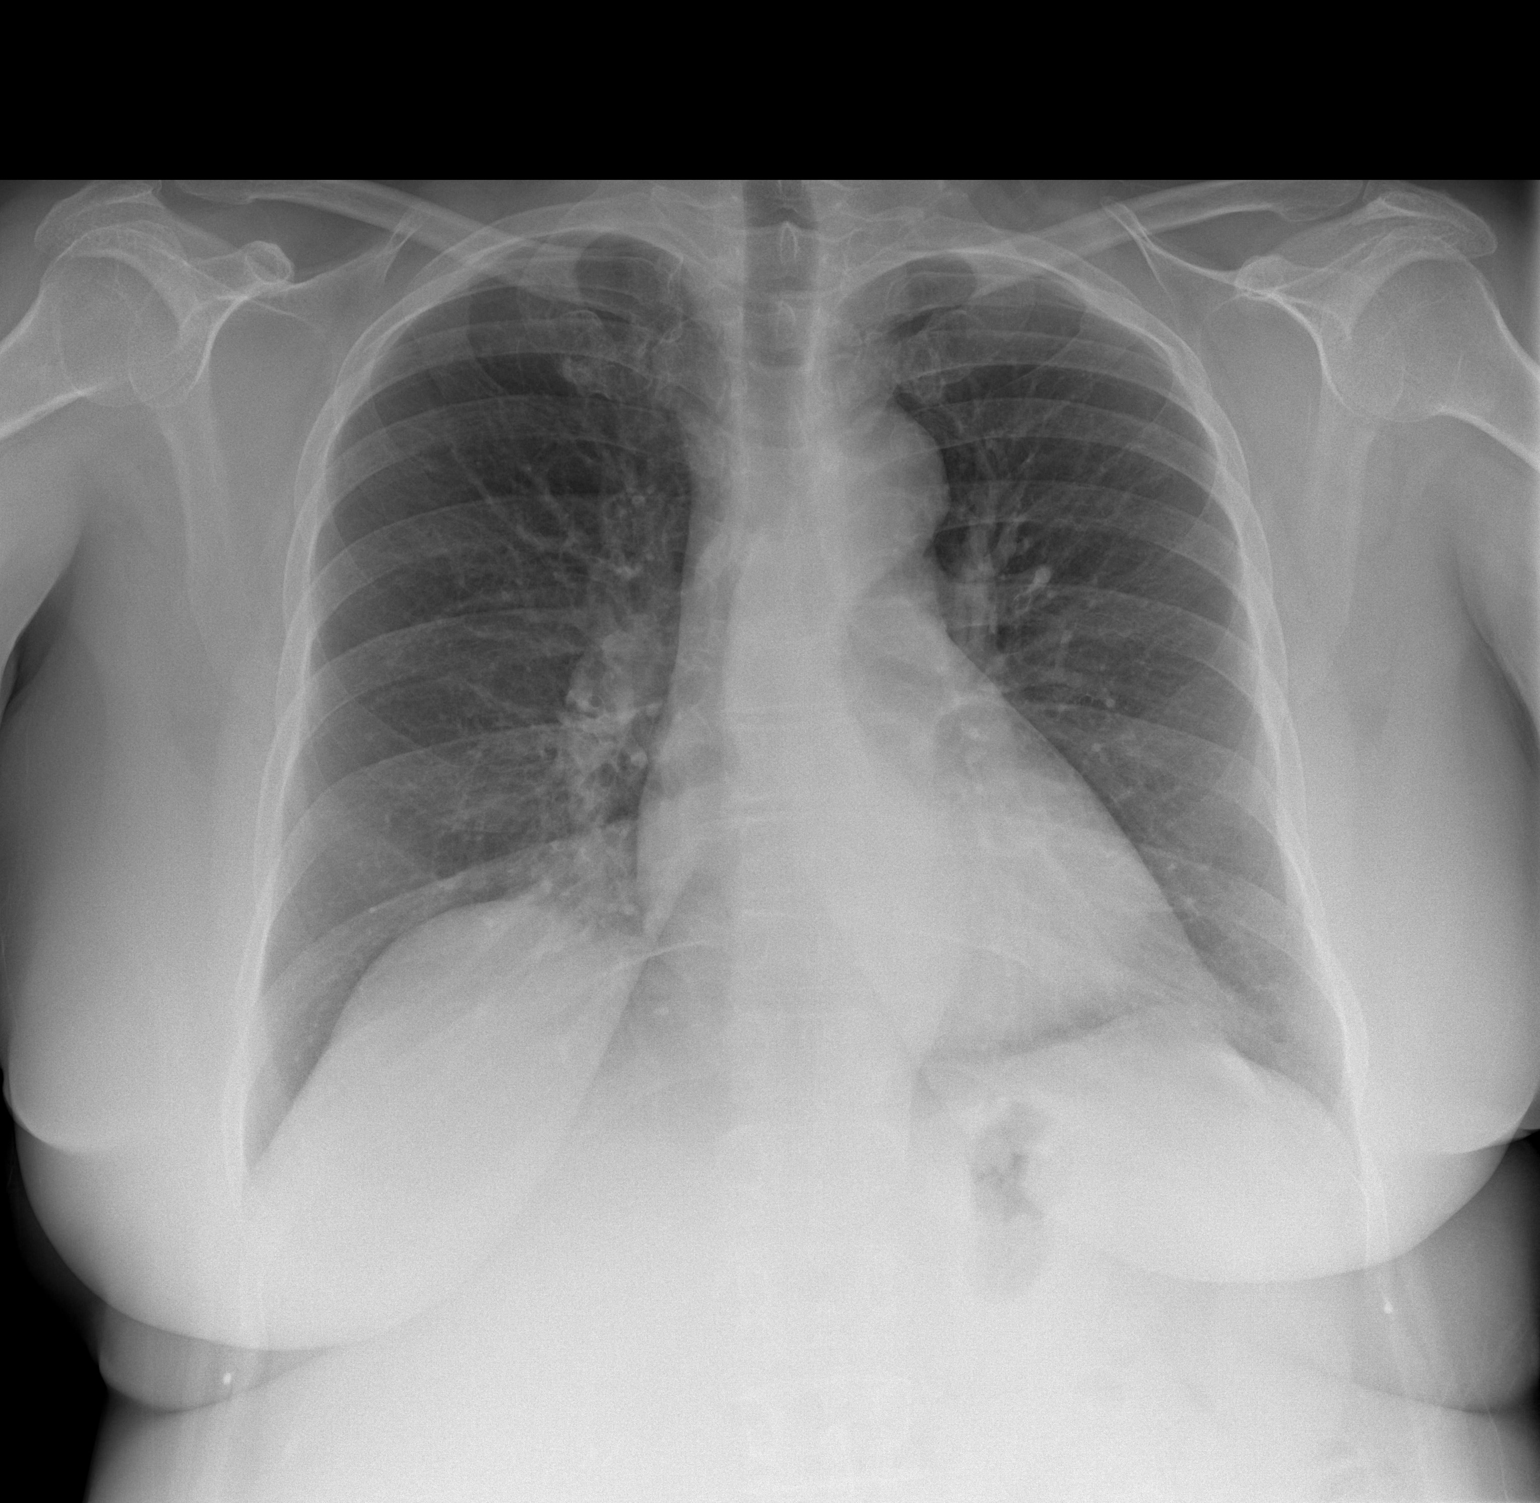
[im 2/2]
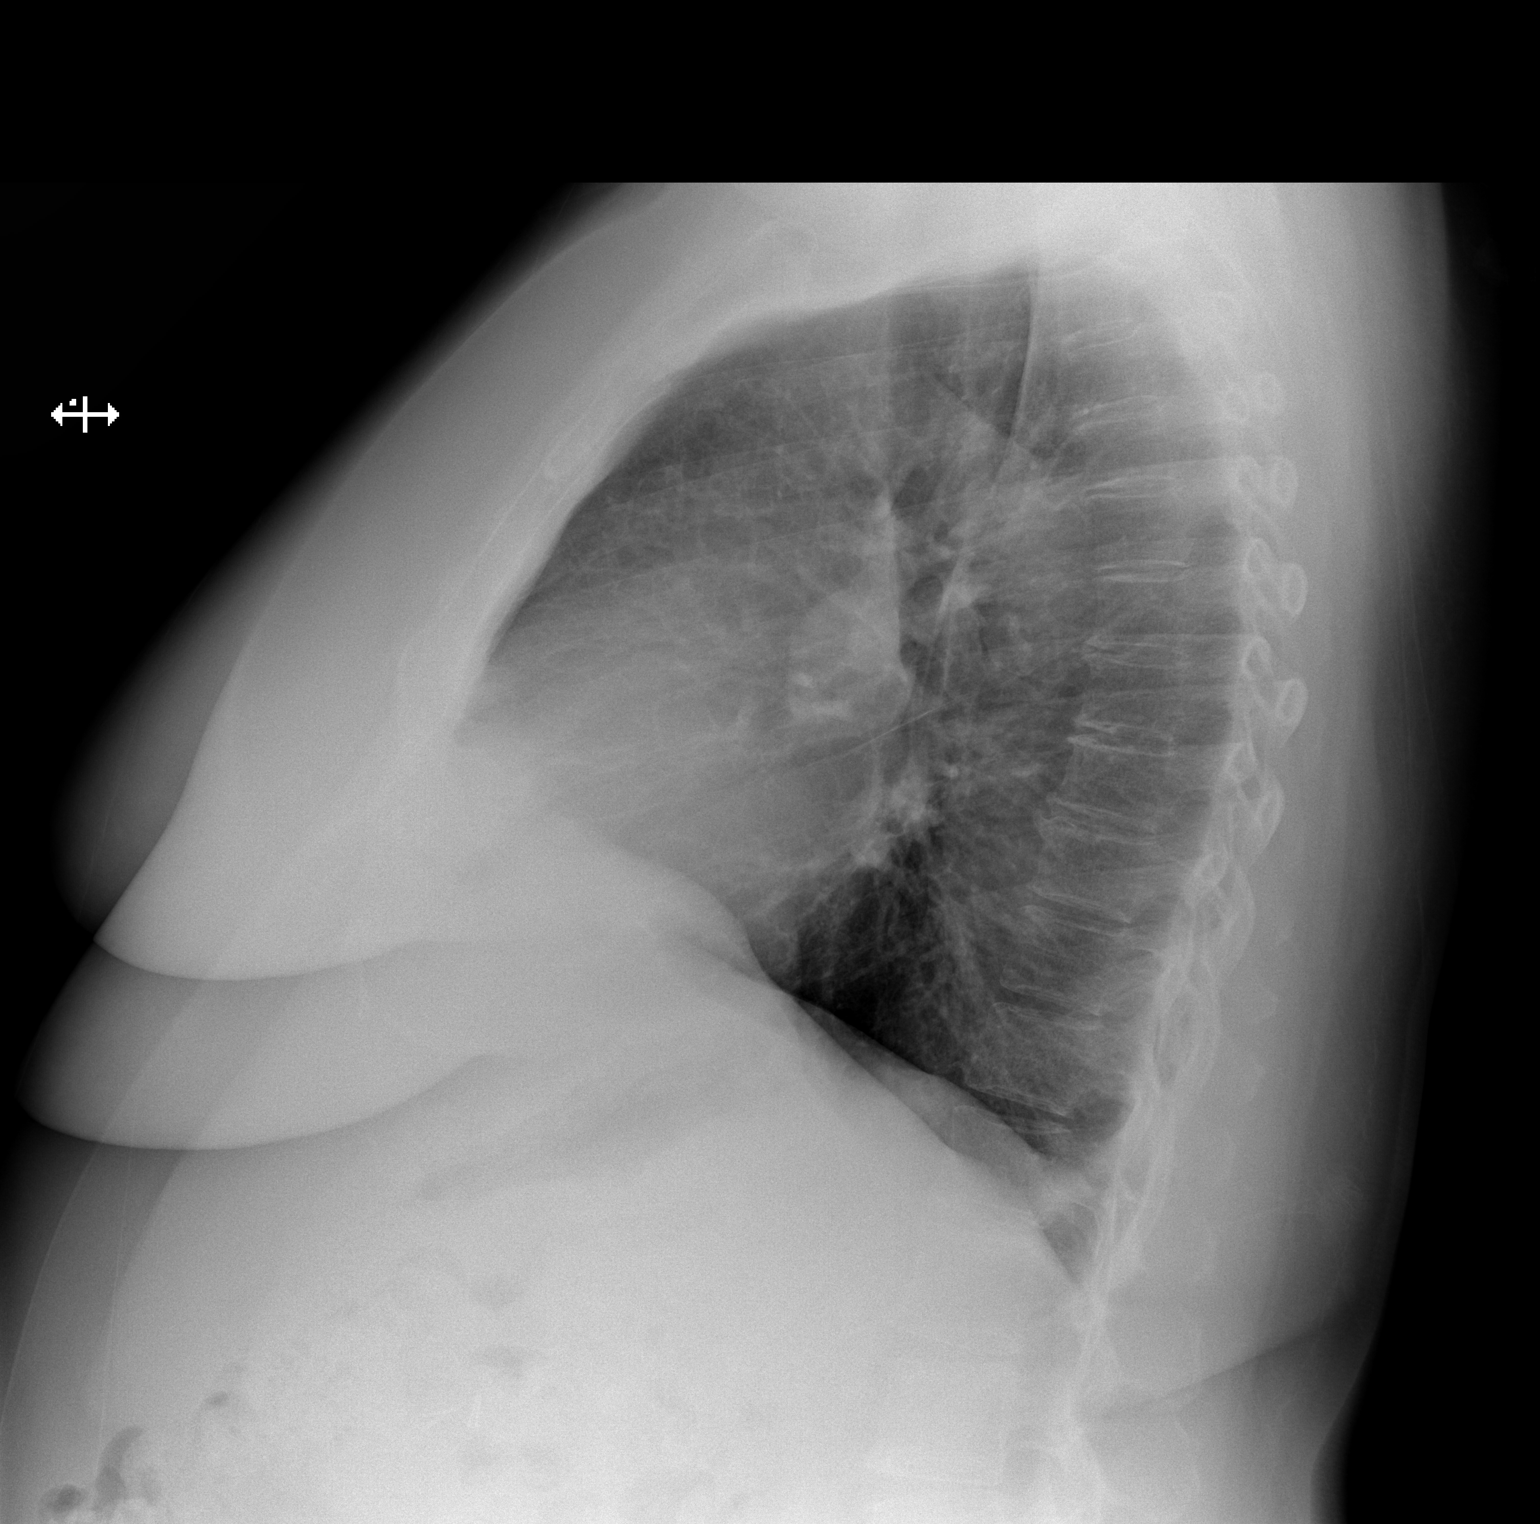

[2 of 2 positions shown; findings below may reference images not displayed]

FINDINGS: Right hemidiaphragm eventration anteriorly. Midline trachea. Normal
heart size. Tortuous thoracic aorta. No pleural effusion or
pneumothorax. Clear lungs.
IMPRESSION: No acute cardiopulmonary disease.

## 2017-08-09 ENCOUNTER — Telehealth: Payer: Self-pay | Admitting: Internal Medicine

## 2017-08-09 NOTE — Telephone Encounter (Signed)
Pt would like a referral to orthopedic for a fractured finger on right hand while she was on vacation. Pt went to ED out of town and they told her she would need an orthopedic referral.

## 2017-08-09 NOTE — Telephone Encounter (Signed)
Copied from [place] [id number]. Topic: Inquiry >> Aug 09, 2017  2:31 PM Barbara Wade, NT wrote: Reason for CRM: Patient called and states that while she was out of town she Fractured her finger on her right hand  Before she was discharge from the hosp they recommend that she see an orthopedic. She is requesting a referral to an orthopedic specialist. Please call patient if this referral can be done . CB# [phone]

## 2017-08-09 NOTE — Telephone Encounter (Signed)
Patient needs referral for ortho

## 2017-08-10 NOTE — Telephone Encounter (Signed)
Spoke with pt and she stated that she has not went to [facility]. She stated that she went to the ER on vacation and they advised her to reach out to her PCP for a referral to orthopedics. The pt stated that it was a spiral fracture of the fourth metacarpal on right hand.

## 2017-08-10 NOTE — Telephone Encounter (Signed)
She needs to go to [facility] because it is the fastest way to get treated

## 2017-08-10 NOTE — Telephone Encounter (Signed)
I need to knoe what finder on the right hand was fracture,  Insurance requires it.  But has she gone to [facility]?

## 2017-08-11 NOTE — Telephone Encounter (Signed)
LMTCB. PEC may speak with pt.  

## 2017-08-12 NOTE — Telephone Encounter (Signed)
Left message with pt to let her know that Dr. Derrel Nip would like for her to go to [place] because it will be the fastest way to get treated.

## 2017-08-16 DIAGNOSIS — S62304A Unspecified fracture of fourth metacarpal bone, right hand, initial encounter for closed fracture: Secondary | ICD-10-CM | POA: Diagnosis not present

## 2017-08-16 DIAGNOSIS — S62304D Unspecified fracture of fourth metacarpal bone, right hand, subsequent encounter for fracture with routine healing: Secondary | ICD-10-CM | POA: Diagnosis not present

## 2017-09-06 DIAGNOSIS — S62304A Unspecified fracture of fourth metacarpal bone, right hand, initial encounter for closed fracture: Secondary | ICD-10-CM | POA: Diagnosis not present

## 2017-09-06 DIAGNOSIS — X58XXXA Exposure to other specified factors, initial encounter: Secondary | ICD-10-CM | POA: Diagnosis not present

## 2017-09-28 ENCOUNTER — Other Ambulatory Visit: Payer: Self-pay | Admitting: Internal Medicine

## 2017-10-04 DIAGNOSIS — S62324D Displaced fracture of shaft of fourth metacarpal bone, right hand, subsequent encounter for fracture with routine healing: Secondary | ICD-10-CM | POA: Diagnosis not present

## 2017-10-04 DIAGNOSIS — X58XXXA Exposure to other specified factors, initial encounter: Secondary | ICD-10-CM | POA: Diagnosis not present

## 2017-10-04 DIAGNOSIS — S62304A Unspecified fracture of fourth metacarpal bone, right hand, initial encounter for closed fracture: Secondary | ICD-10-CM | POA: Diagnosis not present

## 2017-10-04 DIAGNOSIS — S62324A Displaced fracture of shaft of fourth metacarpal bone, right hand, initial encounter for closed fracture: Secondary | ICD-10-CM | POA: Diagnosis not present

## 2017-10-28 LAB — BASIC METABOLIC PANEL
BUN: 13 (ref 4–21)
Creatinine: 0.8 (ref 0.5–1.1)
Potassium: 4.6 (ref 3.4–5.3)
Sodium: 141 (ref 137–147)

## 2017-10-28 LAB — TSH: TSH: 2.7 (ref 0.41–5.90)

## 2017-10-28 LAB — CBC AND DIFFERENTIAL
HEMATOCRIT: 43 (ref 36–46)
HEMOGLOBIN: 14.9 (ref 12.0–16.0)
PLATELETS: 269 (ref 150–399)
WBC: 7.2

## 2017-10-28 LAB — LIPID PANEL
Cholesterol: 236 — AB (ref 0–200)
HDL: 45 (ref 35–70)
LDL CALC: 142
TRIGLYCERIDES: 244 — AB (ref 40–160)

## 2017-10-28 LAB — HEPATIC FUNCTION PANEL
ALK PHOS: 121 (ref 25–125)
ALT: 30 (ref 7–35)
AST: 37 — AB (ref 13–35)

## 2017-10-28 LAB — HEMOGLOBIN A1C: HEMOGLOBIN A1C: 8.3 % — AB (ref 4.0–5.6)

## 2017-11-02 ENCOUNTER — Other Ambulatory Visit: Payer: Self-pay | Admitting: Internal Medicine

## 2017-11-04 DIAGNOSIS — Z1231 Encounter for screening mammogram for malignant neoplasm of breast: Secondary | ICD-10-CM | POA: Diagnosis not present

## 2017-11-06 ENCOUNTER — Telehealth: Payer: Self-pay | Admitting: Internal Medicine

## 2017-11-06 NOTE — Telephone Encounter (Signed)
MyChart message sent  Re labs and abnormal mammogram [facility])

## 2017-11-16 DIAGNOSIS — R928 Other abnormal and inconclusive findings on diagnostic imaging of breast: Secondary | ICD-10-CM | POA: Diagnosis not present

## 2017-11-21 ENCOUNTER — Ambulatory Visit: Payer: BLUE CROSS/BLUE SHIELD | Admitting: Internal Medicine

## 2017-11-21 ENCOUNTER — Encounter: Payer: Self-pay | Admitting: Internal Medicine

## 2017-11-21 VITALS — BP 136/82 | HR 95 | Temp 98.1°F | Resp 15 | Ht 63.0 in | Wt 222.2 lb

## 2017-11-21 DIAGNOSIS — T383X5S Adverse effect of insulin and oral hypoglycemic [antidiabetic] drugs, sequela: Secondary | ICD-10-CM | POA: Diagnosis not present

## 2017-11-21 DIAGNOSIS — E118 Type 2 diabetes mellitus with unspecified complications: Secondary | ICD-10-CM

## 2017-11-21 DIAGNOSIS — L719 Rosacea, unspecified: Secondary | ICD-10-CM

## 2017-11-21 DIAGNOSIS — Z86010 Personal history of colonic polyps: Secondary | ICD-10-CM

## 2017-11-21 DIAGNOSIS — K582 Mixed irritable bowel syndrome: Secondary | ICD-10-CM | POA: Diagnosis not present

## 2017-11-21 DIAGNOSIS — I10 Essential (primary) hypertension: Secondary | ICD-10-CM

## 2017-11-21 MED ORDER — HYOSCYAMINE SULFATE 0.125 MG PO TBDP: 0.1250 mg | ORAL_TABLET | ORAL | 5 refills | Status: DC | PRN

## 2017-11-21 MED ORDER — GLIPIZIDE 5 MG PO TABS: 10.0000 mg | ORAL_TABLET | Freq: Two times a day (BID) | ORAL | 2 refills | Status: DC

## 2017-11-21 NOTE — Patient Instructions (Addendum)
Welcome back!  Today we addressed multiple issues:  1) diabetes: loss of control.   Start glipizide at 5 mg twice daily before meals Send me readings one month.  If they are still above goal  We will discuss adding trulicity   2) IBS:   mmodium can be used once daily to help manage your diarrhea,  Along with a low FodMap diet  3)  I think your facial flushing is  Due to Menopause, complicated by your rosacea    4) blo0d pressure :  The new goals for optimal blood pressure management are 120/70.  Please check your blood pressure a few times at home and send me the readings so I can determine if you need a change in medication    Diet for Irritable Bowel Syndrome When you have irritable bowel syndrome (IBS), the foods you eat and your eating habits are very important. IBS may cause various symptoms, such as abdominal pain, constipation, or diarrhea. Choosing the right foods can help ease discomfort caused by these symptoms. Work with your health care provider and dietitian to find the best eating plan to help control your symptoms. What general guidelines do I need to follow?  Keep a food diary. This will help you identify foods that cause symptoms. Write down: ? What you eat and when. ? What symptoms you have. ? When symptoms occur in relation to your meals.  Avoid foods that cause symptoms. Talk with your dietitian about other ways to get the same nutrients that are in these foods.  Eat more foods that contain fiber. Take a fiber supplement if directed by your dietitian.  Eat your meals slowly, in a relaxed setting.  Aim to eat 5-6 small meals per day. Do not skip meals.  Drink enough fluids to keep your urine clear or pale yellow.  Ask your health care provider if you should take an over-the-counter probiotic during flare-ups to help restore healthy gut bacteria.  If you have cramping or diarrhea, try making your meals low in fat and high in carbohydrates. Examples of  carbohydrates are pasta, rice, whole grain breads and cereals, fruits, and vegetables.  If dairy products cause your symptoms to flare up, try eating less of them. You might be able to handle yogurt better than other dairy products because it contains bacteria that help with digestion. What foods are not recommended? The following are some foods and drinks that may worsen your symptoms:  Fatty foods, such as [place] fries.  Milk products, such as cheese or ice cream.  Chocolate.  Alcohol.  Products with caffeine, such as coffee.  Carbonated drinks, such as soda.  The items listed above may not be a complete list of foods and beverages to avoid. Contact your dietitian for more information. What foods are good sources of fiber? Your health care provider or dietitian may recommend that you eat more foods that contain fiber. Fiber can help reduce constipation and other IBS symptoms. Add foods with fiber to your diet a little at a time so that your body can get used to them. Too much fiber at once might cause gas and swelling of your abdomen. The following are some foods that are good sources of fiber:  Apples.  Peaches.  Pears.  Berries.  Figs.  Broccoli (raw).  Cabbage.  Carrots.  Raw peas.  Kidney beans.  Lima beans.  Whole grain bread.  Whole grain cereal.  Where to find more information: [employer] for Functional Gastrointestinal Disorders:  www.iffgd.[employer] of Diabetes and Digestive and Kidney Diseases: NetworkAffair.co.za.aspx This information is not intended to replace advice given to you by your health care provider. Make sure you discuss any questions you have with your health care provider. Document Released: 04/03/2003 Document Revised: 06/19/2015 Document Reviewed: 04/13/2013 Elsevier Interactive Patient Education  2018 [employer].

## 2017-11-21 NOTE — Progress Notes (Signed)
Subjective:  Patient ID: Barbara Wade, female    DOB: 26-Sep-1960  Age: 57 y.o. MRN: [id number]  CC: The primary encounter diagnosis was Adverse effect of metformin, sequela. Diagnoses of Irritable bowel syndrome with both constipation and diarrhea, Type 2 diabetes mellitus with complication, without long-term current use of insulin ([place]), Morbid obesity ([place]), History of colon polyps, Rosacea, and Essential hypertension were also pertinent to this visit.  HPI [name] presents for follow up on type 2 DM,  Morbid obesity, and hyperlipidemia  Last seen Oct 2018.  Has been lost to follow up due to mother in [place] physical decline and eventual death in July   Mammogram normal [facility]  Oct 23 AFTER  RECALL ON LEFT BREAST   FRACTURE OF 4TH MCP RIGHT HAND  IN  July 2019  . Occurred during a simple fall at home.   Was managed with SPLINT  BY [facility]   OSA:  Has beenwearing cpap every night  Adherence is 99% per [employer].    Facial flushing:  Occurring several times daily.  Has rosacea  HTN:  Patient is taking her medications as prescribed and notes no adverse effects.  Home BP readings have been done about once per week and are  generally > 130/80 .  She is avoiding added salt in her diet and walking regularly about 3 times per week for exercise.    Patient is following a low glycemic index diet and not taking glipizide or metformin  Due to prior side  Effects with metformin.  Fasting sugars have been averaging 160 to 220  and post prandials have been 220 to 250  . Patient is not exercising or intentionally trying to lose weight .  Patient has NOT had an eye exam in the last 12 months and checks feet regularly for signs of infection.  Patient does not walk barefoot outside,  And denies an numbness tingling or burning in feet. Patient is up to date on all recommended vaccinations . She has not resumed glipzide,  Does not want Januvia.   IBS has returned and is   interfering with her walk/exercise.  Multiple stools daily ending in cramping and diarrhea  .  Last colonsocopy date is unclear  .Previous  Symptoms were mostly constipation.  Has been Having a mild  vaginal discharge for the past 5  Months.   No itching or discomfort ,  No odor.  Not sexually active  Weight is up 22 lbs    LDL has risen  by 20   AST  37      Outpatient Medications Prior to Visit  Medication Sig Dispense Refill  . CVS D3 1000 units capsule TAKE 2 CAPSULES BY MOUTH DAILY 120 capsule 1  . EPINEPHrine (EPIPEN) 0.3 mg/0.3 mL DEVI Inject 0.3 mLs (0.3 mg total) into the muscle once. 1 Device 6  . fluticasone (FLONASE) 50 MCG/ACT nasal spray Place 2 sprays into the nose daily as needed.     . Lactobacillus (PROBIOTIC ACIDOPHILUS PO) Take 1 capsule by mouth 2 (two) times daily.    . meclizine (ANTIVERT) 25 MG tablet TAKE 1 TABLET BY MOUTH TWICE DAILY AS NEEDED. 30 tablet 4  . Red Yeast Rice Extract (CVS RED YEAST RICE) 600 MG CAPS Take 1 capsule (600 mg total) by mouth 2 (two) times daily after a meal. 180 capsule 1  . triamterene-hydrochlorothiazide (MAXZIDE-25) 37.5-25 MG tablet Take 1 tablet by mouth daily. 210 tablet 0  .  hyoscyamine (ANASPAZ) 0.125 MG TBDP disintergrating tablet Place 1 tablet (0.125 mg total) under the tongue every 4 (four) hours as needed for cramping. 60 tablet 0  . fexofenadine (ALLEGRA) 180 MG tablet Take 1 tablet (180 mg total) by mouth daily. (Patient taking differently: Take 180 mg by mouth daily as needed. ) 30 tablet 5  . glipiZIDE (GLUCOTROL) 10 MG tablet TAKE 1 TABLET BY MOUTH TWICE DAILY BEFORE A MEAL (Patient not taking: Reported on 11/21/2017) 60 tablet 3  . Milk Thistle 150 MG CAPS Take 1 capsule by mouth 2 (two) times daily.    Marland Kitchen triamterene-hydrochlorothiazide (MAXZIDE-25) 37.5-25 MG tablet TAKE 1 TABLET BY MOUTH ONCE DAILY (Patient not taking: Reported on 11/21/2017) 210 tablet 1   No facility-administered medications prior to visit.      Review of Systems;  Patient denies headache, fevers, malaise, unintentional weight loss, skin rash, eye pain, sinus congestion and sinus pain, sore throat, dysphagia,  hemoptysis , cough, dyspnea, wheezing, chest pain, palpitations, orthopnea, edema, abdominal pain, nausea, melena, , constipation, flank pain, dysuria, hematuria, urinary  Frequency, nocturia, numbness, tingling, seizures,  Focal weakness, Loss of consciousness,  Tremor, insomnia, depression, anxiety, and suicidal ideation.      Objective:  BP 136/82 (BP Location: Left Arm, Patient Position: Sitting, Cuff Size: Large)   Pulse 95   Temp 98.1 F (36.7 C) (Oral)   Resp 15   Ht 5\' 3"  (1.6 m)   Wt 222 lb 3.2 oz (100.8 kg)   SpO2 95%   BMI 39.36 kg/m   BP Readings from Last 3 Encounters:  11/21/17 136/82  11/19/16 100/60  07/02/16 110/68    Wt Readings from Last 3 Encounters:  11/21/17 222 lb 3.2 oz (100.8 kg)  11/19/16 201 lb 6.4 oz (91.4 kg)  07/02/16 214 lb 12.8 oz (97.4 kg)    General appearance: alert, cooperative and appears stated age Ears: normal TM's and external ear canals both ears Throat: lips, mucosa, and tongue normal; teeth and gums normal Neck: no adenopathy, no carotid bruit, supple, symmetrical, trachea midline and thyroid not enlarged, symmetric, no tenderness/mass/nodules Back: symmetric, no curvature. ROM normal. No CVA tenderness. Lungs: clear to auscultation bilaterally Heart: regular rate and rhythm, S1, S2 normal, no murmur, click, rub or gallop Abdomen: soft, non-tender; bowel sounds normal; no masses,  no organomegaly Pulses: 2+ and symmetric Skin: Skin color, texture, turgor normal. No rashes or lesions Lymph nodes: Cervical, supraclavicular, and axillary nodes normal.  Lab Results  Component Value Date   HGBA1C 8.3 (A) 10/28/2017   HGBA1C 5.8 11/19/2016   HGBA1C 7.9 (H) 06/29/2016    Lab Results  Component Value Date   CREATININE 0.8 10/28/2017   CREATININE 0.7 10/06/2016    CREATININE 0.81 06/29/2016    Lab Results  Component Value Date   WBC 7.2 10/28/2017   HGB 14.9 10/28/2017   HCT 43 10/28/2017   PLT 269 10/28/2017   GLUCOSE 193 (H) 06/29/2016   CHOL 236 (A) 10/28/2017   TRIG 244 (A) 10/28/2017   HDL 45 10/28/2017   LDLDIRECT 116.0 06/29/2016   LDLCALC 142 10/28/2017   ALT 30 10/28/2017   AST 37 (A) 10/28/2017   NA 141 10/28/2017   K 4.6 10/28/2017   CL 103 06/29/2016   CREATININE 0.8 10/28/2017   BUN 13 10/28/2017   CO2 28 06/29/2016   TSH 2.70 10/28/2017   HGBA1C 8.3 (A) 10/28/2017   MICROALBUR 1.7 06/29/2016    Mr Abdomen Wo Contrast  Result Date:  10/07/2015 CLINICAL DATA:  Pain and upper abdomen years xiphoid process. EXAM: MRI ABDOMEN WITHOUT CONTRAST TECHNIQUE: Multiplanar multisequence MR imaging was performed without the administration of intravenous contrast. COMPARISON:  11/25/14 FINDINGS: Lower chest: No pleural or pericardial effusion identified. Hepatobiliary: Marked diffuse hepatic steatosis. No focal liver abnormality. Previous cholecystectomy. Increase caliber of the extrahepatic common bile duct which measures 11 mm. No obstructing stone or mass. Pancreas:  Normal appearance of the pancreas. Spleen:  Normal appearance of the spleen. Adrenals/Urinary Tract: The adrenal glands appear normal. Cyst in the right kidney measures 1.5 cm. No hydronephrosis or kidney mass identified. Stomach/Bowel: The stomach appears normal. No pathologic dilatation of the upper abdominal bowel loops. Vascular/Lymphatic: Normal appearance of the abdominal aorta. No upper abdominal adenopathy. Other:  No free fluid or fluid collections identified. Musculoskeletal: No suspicious bone lesions identified.No abnormal signal identified. IMPRESSION: 1. No acute findings and no explanation for patient's upper abdominal pain. 2. Right kidney cysts. 3. Previous cholecystectomy. Electronically Signed   By: Kerby Moors M.D.   On: 10/07/2015 10:23    Assessment &  Plan:   Problem List Items Addressed This Visit    Essential hypertension    she reports compliance with maxzifr  but has an elevated reading today in office.  He has been asked to check his BP at work and  submit readings for evaluation. Renal function is normal .   Lab Results  Component Value Date   CREATININE 0.8 10/28/2017    Lab Results  Component Value Date   NA 141 10/28/2017   K 4.6 10/28/2017   CL 103 06/29/2016   CO2 28 06/29/2016         History of colon polyps    Last scope was reportedly in 2013 with no report available.  There eis a FH of colon CA and a personal history of polyps.  Referral made.       Relevant Orders   Ambulatory referral to Gastroenterology   Irritable bowel syndrome (IBS)    Symptoms have changed from primarily constipation to multiple stools daily which become progressively more liquid and uncontrolled.  Low Fod Map diet given,   Hyoscyamine refilled.  Will refer for colonoscopy given change in bowel habits.       Relevant Medications   hyoscyamine (ANASPAZ) 0.125 MG TBDP disintergrating tablet   Other Relevant Orders   Ambulatory referral to Gastroenterology   Morbid obesity ([place])    I have addressed  BMI and recommended wt loss of 10% of body weigh over the next 6 months using a low glycemic index diet and regular exercise a minimum of 5 days per week.        Relevant Medications   glipiZIDE (GLUCOTROL) 5 MG tablet   Rosacea    Aggravated by hot flashes. Food avoidances  Recommended,  Follow up with dermatology as needed      Type 2 diabetes mellitus with complication, without long-term current use of insulin ([place])    Advised to resume glipizide starting at 5 mg bid.  Will add Trulicity or Jardiance at follow up if not at goal. Already  following a low GI diet.   Lab Results  Component Value Date   HGBA1C 8.3 (A) 10/28/2017   Lab Results  Component Value Date   MICROALBUR 1.7 06/29/2016         Relevant Medications    glipiZIDE (GLUCOTROL) 5 MG tablet    Other Visit Diagnoses    Adverse effect of metformin,  sequela    -  Primary    A total of 40 minutes was spent with patient more than half of which was spent in counseling patient on the above mentioned issues , reviewing and explaining recent labs and imaging studies done, and coordination of care.  I have discontinued Janus von Hassel Davies's Milk Thistle. I have also changed her glipiZIDE. Additionally, I am having her maintain her EPINEPHrine, fluticasone, fexofenadine, Red Yeast Rice Extract, CVS D3, meclizine, Lactobacillus (PROBIOTIC ACIDOPHILUS PO), triamterene-hydrochlorothiazide, and hyoscyamine.  Meds ordered this encounter  Medications  . DISCONTD: hyoscyamine (ANASPAZ) 0.125 MG TBDP disintergrating tablet    Sig: Place 1 tablet (0.125 mg total) under the tongue every 4 (four) hours as needed for cramping.    Dispense:  60 tablet    Refill:  5  . glipiZIDE (GLUCOTROL) 5 MG tablet    Sig: Take 2 tablets (10 mg total) by mouth 2 (two) times daily before a meal.    Dispense:  180 tablet    Refill:  2  . hyoscyamine (ANASPAZ) 0.125 MG TBDP disintergrating tablet    Sig: Place 1 tablet (0.125 mg total) under the tongue every 4 (four) hours as needed for cramping.    Dispense:  60 tablet    Refill:  5    Medications Discontinued During This Encounter  Medication Reason  . Milk Thistle 150 MG CAPS Patient Preference  . triamterene-hydrochlorothiazide (MAXZIDE-25) 56.2-56 MG tablet Duplicate  . hyoscyamine (ANASPAZ) 0.125 MG TBDP disintergrating tablet Reorder  . glipiZIDE (GLUCOTROL) 10 MG tablet   . hyoscyamine (ANASPAZ) 0.125 MG TBDP disintergrating tablet Reorder    Follow-up: Return in about 4 weeks (around 12/19/2017) for CPE WITH PELVIC EXAM.   Crecencio Mc, MD

## 2017-11-22 DIAGNOSIS — I10 Essential (primary) hypertension: Secondary | ICD-10-CM | POA: Insufficient documentation

## 2017-11-22 NOTE — Assessment & Plan Note (Signed)
I have addressed  BMI and recommended wt loss of 10% of body weigh over the next 6 months using a low glycemic index diet and regular exercise a minimum of 5 days per week.   

## 2017-11-22 NOTE — Assessment & Plan Note (Signed)
Advised to resume glipizide starting at 5 mg bid.  Will add Trulicity or Jardiance at follow up if not at goal. Already  following a low GI diet.   Lab Results  Component Value Date   HGBA1C 8.3 (A) 10/28/2017   Lab Results  Component Value Date   MICROALBUR 1.7 06/29/2016

## 2017-11-22 NOTE — Assessment & Plan Note (Signed)
Last scope was reportedly in 2013 with no report available.  There eis a FH of colon CA and a personal history of polyps.  Referral made.

## 2017-11-22 NOTE — Assessment & Plan Note (Signed)
Aggravated by hot flashes. Food avoidances  Recommended,  Follow up with dermatology as needed

## 2017-11-22 NOTE — Assessment & Plan Note (Addendum)
Symptoms have changed from primarily constipation to multiple stools daily which become progressively more liquid and uncontrolled.  Low Fod Map diet given,   Hyoscyamine refilled.  Will refer for colonoscopy given change in bowel habits.

## 2017-11-22 NOTE — Assessment & Plan Note (Signed)
she reports compliance with maxzifr  but has an elevated reading today in office.  He has been asked to check his BP at work and  submit readings for evaluation. Renal function is normal .   Lab Results  Component Value Date   CREATININE 0.8 10/28/2017    Lab Results  Component Value Date   NA 141 10/28/2017   K 4.6 10/28/2017   CL 103 06/29/2016   CO2 28 06/29/2016

## 2017-11-30 ENCOUNTER — Ambulatory Visit: Payer: BLUE CROSS/BLUE SHIELD

## 2017-12-02 ENCOUNTER — Ambulatory Visit (INDEPENDENT_AMBULATORY_CARE_PROVIDER_SITE_OTHER): Payer: BLUE CROSS/BLUE SHIELD

## 2017-12-02 DIAGNOSIS — Z23 Encounter for immunization: Secondary | ICD-10-CM

## 2017-12-02 NOTE — Progress Notes (Signed)
Pt is here today for a BP check. Currently not taking anything for BP. Pt did start on the Glipizide medication.    LA: 110/68, P: 64  , O2: 97  RA: 138/78, P: 64 , O2: 96  Pt has not been keeping track of her BP at home.     Pt did bring in her own BP monitor. BP monitor is brand new patient stated.   LA: 146/84, P:59   RA: 144/88, P:61   Pt requested for a flu shot as well. Flu shot was given in LD patient tolerated well.

## 2017-12-05 ENCOUNTER — Ambulatory Visit: Payer: BLUE CROSS/BLUE SHIELD

## 2017-12-15 ENCOUNTER — Ambulatory Visit: Payer: BLUE CROSS/BLUE SHIELD | Admitting: Gastroenterology

## 2017-12-30 DIAGNOSIS — E119 Type 2 diabetes mellitus without complications: Secondary | ICD-10-CM | POA: Diagnosis not present

## 2017-12-30 LAB — HM DIABETES EYE EXAM

## 2018-01-06 ENCOUNTER — Encounter: Payer: Self-pay | Admitting: Internal Medicine

## 2018-01-06 ENCOUNTER — Ambulatory Visit (INDEPENDENT_AMBULATORY_CARE_PROVIDER_SITE_OTHER): Payer: BLUE CROSS/BLUE SHIELD | Admitting: Internal Medicine

## 2018-01-06 VITALS — BP 122/70 | HR 67 | Temp 97.9°F | Resp 14 | Ht 63.0 in | Wt 221.4 lb

## 2018-01-06 DIAGNOSIS — Z0000 Encounter for general adult medical examination without abnormal findings: Secondary | ICD-10-CM | POA: Diagnosis not present

## 2018-01-06 DIAGNOSIS — I10 Essential (primary) hypertension: Secondary | ICD-10-CM

## 2018-01-06 DIAGNOSIS — E785 Hyperlipidemia, unspecified: Secondary | ICD-10-CM | POA: Diagnosis not present

## 2018-01-06 DIAGNOSIS — E118 Type 2 diabetes mellitus with unspecified complications: Secondary | ICD-10-CM | POA: Diagnosis not present

## 2018-01-06 DIAGNOSIS — Z23 Encounter for immunization: Secondary | ICD-10-CM

## 2018-01-06 LAB — MICROALBUMIN / CREATININE URINE RATIO
Creatinine,U: 123.7 mg/dL
Microalb Creat Ratio: 1.1 mg/g (ref 0.0–30.0)
Microalb, Ur: 1.3 mg/dL (ref 0.0–1.9)

## 2018-01-06 MED ORDER — DULAGLUTIDE 0.75 MG/0.5ML ~~LOC~~ SOAJ: 0.7500 mg | SUBCUTANEOUS | 2 refills | Status: DC

## 2018-01-06 NOTE — Patient Instructions (Signed)
Continue glipizide 5 mg twice daily before meals  Adding Trulicity one injection  WEEKLY  .  This IS NOT INSULIN   We can increase the dose after a month if fastigs are not < 140 and post prandials aren't < 200  Dulaglutide injection What is this medicine? DULAGLUTIDE (DOO la GLOO tide) is used to improve blood sugar control in adults with type 2 diabetes. This medicine may be used with other oral diabetes medicines. This medicine may be used for other purposes; ask your health care provider or pharmacist if you have questions. COMMON BRAND NAME(S): TRULICITY What should I tell my health care provider before I take this medicine? They need to know if you have any of these conditions: -endocrine tumors (MEN 2) or if someone in your family had these tumors -history of pancreatitis -kidney disease -liver disease -stomach problems -thyroid cancer or if someone in your family had thyroid cancer -an unusual or allergic reaction to dulaglutide, other medicines, foods, dyes, or preservatives -pregnant or trying to get pregnant -breast-feeding How should I use this medicine? This medicine is for injection under the skin of your upper leg (thigh), stomach area, or upper arm. It is usually given once every week (every 7 days). You will be taught how to prepare and give this medicine. Use exactly as directed. Take your medicine at regular intervals. Do not take it more often than directed. If you use this medicine with insulin, you should inject this medicine and the insulin separately. Do not mix them together. Do not give the injections right next to each other. Change (rotate) injection sites with each injection. It is important that you put your used needles and syringes in a special sharps container. Do not put them in a trash can. If you do not have a sharps container, call your pharmacist or healthcare provider to get one. A special MedGuide will be given to you by the pharmacist with each  prescription and refill. Be sure to read this information carefully each time. Talk to your pediatrician regarding the use of this medicine in children. Special care may be needed. Overdosage: If you think you have taken too much of this medicine contact a poison control center or emergency room at once. NOTE: This medicine is only for you. Do not share this medicine with others. What if I miss a dose? If you miss a dose, take it as soon as you can within 3 days after the missed dose. Then take your next dose at your regular weekly time. If it has been longer than 3 days after the missed dose, do not take the missed dose. Take the next dose at your regular time. Do not take double or extra doses. If you have questions about a missed dose, contact your health care provider for advice. What may interact with this medicine? -other medicines for diabetes Many medications may cause changes in blood sugar, these include: -alcohol containing beverages -antiviral medicines for HIV or AIDS -aspirin and aspirin-like drugs -certain medicines for blood pressure, heart disease, irregular heart beat -chromium -diuretics -female hormones, such as estrogens or progestins, birth control pills -fenofibrate -gemfibrozil -isoniazid -lanreotide -female hormones or anabolic steroids -MAOIs like Carbex, Eldepryl, Marplan, Nardil, and Parnate -medicines for weight loss -medicines for allergies, asthma, cold, or cough -medicines for depression, anxiety, or psychotic disturbances -niacin -nicotine -NSAIDs, medicines for pain and inflammation, like ibuprofen or naproxen -octreotide -pasireotide -pentamidine -phenytoin -probenecid -quinolone antibiotics such as ciprofloxacin, levofloxacin, ofloxacin -some herbal dietary  supplements -steroid medicines such as prednisone or cortisone -sulfamethoxazole; trimethoprim -thyroid hormones Some medications can hide the warning symptoms of low blood sugar  (hypoglycemia). You may need to monitor your blood sugar more closely if you are taking one of these medications. These include: -beta-blockers, often used for high blood pressure or heart problems (examples include atenolol, metoprolol, propranolol) -clonidine -guanethidine -reserpine This list may not describe all possible interactions. Give your health care provider a list of all the medicines, herbs, non-prescription drugs, or dietary supplements you use. Also tell them if you smoke, drink alcohol, or use illegal drugs. Some items may interact with your medicine. What should I watch for while using this medicine? Visit your doctor or health care professional for regular checks on your progress. Drink plenty of fluids while taking this medicine. Check with your doctor or health care professional if you get an attack of severe diarrhea, nausea, and vomiting. The loss of too much body fluid can make it dangerous for you to take this medicine. A test called the HbA1C (A1C) will be monitored. This is a simple blood test. It measures your blood sugar control over the last 2 to 3 months. You will receive this test every 3 to 6 months. Learn how to check your blood sugar. Learn the symptoms of low and high blood sugar and how to manage them. Always carry a quick-source of sugar with you in case you have symptoms of low blood sugar. Examples include hard sugar candy or glucose tablets. Make sure others know that you can choke if you eat or drink when you develop serious symptoms of low blood sugar, such as seizures or unconsciousness. They must get medical help at once. Tell your doctor or health care professional if you have high blood sugar. You might need to change the dose of your medicine. If you are sick or exercising more than usual, you might need to change the dose of your medicine. Do not skip meals. Ask your doctor or health care professional if you should avoid alcohol. Many nonprescription cough  and cold products contain sugar or alcohol. These can affect blood sugar. Pens should never be shared. Even if the needle is changed, sharing may result in passing of viruses like hepatitis or HIV. Wear a medical ID bracelet or chain, and carry a card that describes your disease and details of your medicine and dosage times. What side effects may I notice from receiving this medicine? Side effects that you should report to your doctor or health care professional as soon as possible: -allergic reactions like skin rash, itching or hives, swelling of the face, lips, or tongue -breathing problems -diarrhea that continues or is severe -lump or swelling on the neck -severe nausea -signs and symptoms of infection like fever or chills; cough; sore throat; pain or trouble passing urine -signs and symptoms of low blood sugar such as feeling anxious, confusion, dizziness, increased hunger, unusually weak or tired, sweating, shakiness, cold, irritable, headache, blurred vision, fast heartbeat, loss of consciousness -signs and symptoms of kidney injury like trouble passing urine or change in the amount of urine -trouble swallowing -unusual stomach upset or pain -vomiting Side effects that usually do not require medical attention (report to your doctor or health care professional if they continue or are bothersome): -diarrhea -loss of appetite -nausea -pain, redness, or irritation at site where injected -stomach upset This list may not describe all possible side effects. Call your doctor for medical advice about side effects. You  may report side effects to FDA at 1-800-FDA-1088. Where should I keep my medicine? Keep out of the reach of children. Store unopened pens in a refrigerator between 2 and 8 degrees C (36 and 46 degrees F). Do not freeze or use if the medicine has been frozen. Protect from light and excessive heat. Store in the carton until use. Each single-dose pen can be kept at room temperature,  not to exceed 30 degrees C (86 degrees F) for a total of 14 days, if needed. Throw away any unused medicine after the expiration date on the label. NOTE: This sheet is a summary. It may not cover all possible information. If you have questions about this medicine, talk to your doctor, pharmacist, or health care provider.  2018 Elsevier/Gold Standard (2016-01-29 14:35:01)    Health Maintenance for Postmenopausal Women Menopause is a normal process in which your reproductive ability comes to an end. This process happens gradually over a span of months to years, usually between the ages of 69 and 28. Menopause is complete when you have missed 12 consecutive menstrual periods. It is important to talk with your health care provider about some of the most common conditions that affect postmenopausal women, such as heart disease, cancer, and bone loss (osteoporosis). Adopting a healthy lifestyle and getting preventive care can help to promote your health and wellness. Those actions can also lower your chances of developing some of these common conditions. What should I know about menopause? During menopause, you may experience a number of symptoms, such as:  Moderate-to-severe hot flashes.  Night sweats.  Decrease in sex drive.  Mood swings.  Headaches.  Tiredness.  Irritability.  Memory problems.  Insomnia.  Choosing to treat or not to treat menopausal changes is an individual decision that you make with your health care provider. What should I know about hormone replacement therapy and supplements? Hormone therapy products are effective for treating symptoms that are associated with menopause, such as hot flashes and night sweats. Hormone replacement carries certain risks, especially as you become older. If you are thinking about using estrogen or estrogen with progestin treatments, discuss the benefits and risks with your health care provider. What should I know about heart disease and  stroke? Heart disease, heart attack, and stroke become more likely as you age. This may be due, in part, to the hormonal changes that your body experiences during menopause. These can affect how your body processes dietary fats, triglycerides, and cholesterol. Heart attack and stroke are both medical emergencies. There are many things that you can do to help prevent heart disease and stroke:  Have your blood pressure checked at least every 1-2 years. High blood pressure causes heart disease and increases the risk of stroke.  If you are 52-71 years old, ask your health care provider if you should take aspirin to prevent a heart attack or a stroke.  Do not use any tobacco products, including cigarettes, chewing tobacco, or electronic cigarettes. If you need help quitting, ask your health care provider.  It is important to eat a healthy diet and maintain a healthy weight. ? Be sure to include plenty of vegetables, fruits, low-fat dairy products, and lean protein. ? Avoid eating foods that are high in solid fats, added sugars, or salt (sodium).  Get regular exercise. This is one of the most important things that you can do for your health. ? Try to exercise for at least 150 minutes each week. The type of exercise that you do  should increase your heart rate and make you sweat. This is known as moderate-intensity exercise. ? Try to do strengthening exercises at least twice each week. Do these in addition to the moderate-intensity exercise.  Know your numbers.Ask your health care provider to check your cholesterol and your blood glucose. Continue to have your blood tested as directed by your health care provider.  What should I know about cancer screening? There are several types of cancer. Take the following steps to reduce your risk and to catch any cancer development as early as possible. Breast Cancer  Practice breast self-awareness. ? This means understanding how your breasts normally appear  and feel. ? It also means doing regular breast self-exams. Let your health care provider know about any changes, no matter how small.  If you are 2 or older, have a clinician do a breast exam (clinical breast exam or CBE) every year. Depending on your age, family history, and medical history, it may be recommended that you also have a yearly breast X-ray (mammogram).  If you have a family history of breast cancer, talk with your health care provider about genetic screening.  If you are at high risk for breast cancer, talk with your health care provider about having an MRI and a mammogram every year.  Breast cancer (BRCA) gene test is recommended for women who have family members with BRCA-related cancers. Results of the assessment will determine the need for genetic counseling and BRCA1 and for BRCA2 testing. BRCA-related cancers include these types: ? Breast. This occurs in males or females. ? Ovarian. ? Tubal. This may also be called fallopian tube cancer. ? Cancer of the abdominal or pelvic lining (peritoneal cancer). ? Prostate. ? Pancreatic.  Cervical, Uterine, and Ovarian Cancer Your health care provider may recommend that you be screened regularly for cancer of the pelvic organs. These include your ovaries, uterus, and vagina. This screening involves a pelvic exam, which includes checking for microscopic changes to the surface of your cervix (Pap test).  For women ages 21-65, health care providers may recommend a pelvic exam and a Pap test every three years. For women ages 20-65, they may recommend the Pap test and pelvic exam, combined with testing for human papilloma virus (HPV), every five years. Some types of HPV increase your risk of cervical cancer. Testing for HPV may also be done on women of any age who have unclear Pap test results.  Other health care providers may not recommend any screening for nonpregnant women who are considered low risk for pelvic cancer and have no  symptoms. Ask your health care provider if a screening pelvic exam is right for you.  If you have had past treatment for cervical cancer or a condition that could lead to cancer, you need Pap tests and screening for cancer for at least 20 years after your treatment. If Pap tests have been discontinued for you, your risk factors (such as having a new sexual partner) need to be reassessed to determine if you should start having screenings again. Some women have medical problems that increase the chance of getting cervical cancer. In these cases, your health care provider may recommend that you have screening and Pap tests more often.  If you have a family history of uterine cancer or ovarian cancer, talk with your health care provider about genetic screening.  If you have vaginal bleeding after reaching menopause, tell your health care provider.  There are currently no reliable tests available to screen for ovarian  cancer.  Lung Cancer Lung cancer screening is recommended for adults 58-42 years old who are at high risk for lung cancer because of a history of smoking. A yearly low-dose CT scan of the lungs is recommended if you:  Currently smoke.  Have a history of at least 30 pack-years of smoking and you currently smoke or have quit within the past 15 years. A pack-year is smoking an average of one pack of cigarettes per day for one year.  Yearly screening should:  Continue until it has been 15 years since you quit.  Stop if you develop a health problem that would prevent you from having lung cancer treatment.  Colorectal Cancer  This type of cancer can be detected and can often be prevented.  Routine colorectal cancer screening usually begins at age 39 and continues through age 85.  If you have risk factors for colon cancer, your health care provider may recommend that you be screened at an earlier age.  If you have a family history of colorectal cancer, talk with your health care  provider about genetic screening.  Your health care provider may also recommend using home test kits to check for hidden blood in your stool.  A small camera at the end of a tube can be used to examine your colon directly (sigmoidoscopy or colonoscopy). This is done to check for the earliest forms of colorectal cancer.  Direct examination of the colon should be repeated every 5-10 years until age 58. However, if early forms of precancerous polyps or small growths are found or if you have a family history or genetic risk for colorectal cancer, you may need to be screened more often.  Skin Cancer  Check your skin from head to toe regularly.  Monitor any moles. Be sure to tell your health care provider: ? About any new moles or changes in moles, especially if there is a change in a mole's shape or color. ? If you have a mole that is larger than the size of a pencil eraser.  If any of your family members has a history of skin cancer, especially at a young age, talk with your health care provider about genetic screening.  Always use sunscreen. Apply sunscreen liberally and repeatedly throughout the day.  Whenever you are outside, protect yourself by wearing long sleeves, pants, a wide-brimmed hat, and sunglasses.  What should I know about osteoporosis? Osteoporosis is a condition in which bone destruction happens more quickly than new bone creation. After menopause, you may be at an increased risk for osteoporosis. To help prevent osteoporosis or the bone fractures that can happen because of osteoporosis, the following is recommended:  If you are 26-55 years old, get at least 1,000 mg of calcium and at least 600 mg of vitamin D per day.  If you are older than age 66 but younger than age 30, get at least 1,200 mg of calcium and at least 600 mg of vitamin D per day.  If you are older than age 69, get at least 1,200 mg of calcium and at least 800 mg of vitamin D per day.  Smoking and excessive  alcohol intake increase the risk of osteoporosis. Eat foods that are rich in calcium and vitamin D, and do weight-bearing exercises several times each week as directed by your health care provider. What should I know about how menopause affects my mental health? Depression may occur at any age, but it is more common as you become older. Common  symptoms of depression include:  Low or sad mood.  Changes in sleep patterns.  Changes in appetite or eating patterns.  Feeling an overall lack of motivation or enjoyment of activities that you previously enjoyed.  Frequent crying spells.  Talk with your health care provider if you think that you are experiencing depression. What should I know about immunizations? It is important that you get and maintain your immunizations. These include:  Tetanus, diphtheria, and pertussis (Tdap) booster vaccine.  Influenza every year before the flu season begins.  Pneumonia vaccine.  Shingles vaccine.  Your health care provider may also recommend other immunizations. This information is not intended to replace advice given to you by your health care provider. Make sure you discuss any questions you have with your health care provider. Document Released: 03/05/2005 Document Revised: 08/01/2015 Document Reviewed: 10/15/2014 Elsevier Interactive Patient Education  2018 [employer].

## 2018-01-06 NOTE — Progress Notes (Signed)
Patient ID: Barbara Wade, female    DOB: [birth year]  Age: 57 y.o. MRN: [id number]  The patient is here for annual preventive examination and management of other chronic and acute problems.   The risk factors are reflected in the social history.  The roster of all physicians providing medical care to patient - is listed in the Snapshot section of the chart.  Activities of daily living:  The patient is 100% independent in all ADLs: dressing, toileting, feeding as well as independent mobility  Home safety : The patient has smoke detectors in the home. They wear seatbelts.  There are no firearms at home. There is no violence in the home.   There is no risks for hepatitis, STDs or HIV. There is no   history of blood transfusion. They have no travel history to infectious disease endemic areas of the world.  The patient has seen their dentist in the last six month. They have seen their eye doctor in the last year. They admit to slight hearing difficulty with regard to whispered voices and some television programs.  They have deferred audiologic testing in the last year.  They do not  have excessive sun exposure. Discussed the need for sun protection: hats, long sleeves and use of sunscreen if there is significant sun exposure.   Diet: the importance of a healthy diet is discussed. She is following a lowcarb diet They do have a healthy diet.  The benefits of regular aerobic exercise were discussed. She walks 4 times per week ,  20 minutes.   Depression screen: there are no signs or vegative symptoms of depression- irritability, change in appetite, anhedonia, sadness/tearfullness.  Cognitive assessment: the patient manages all their financial and personal affairs and is actively engaged. They could relate day,date,year and events; recalled 2/3 objects at 3 minutes; performed clock-face test normally.  The following portions of the patient's history were reviewed and updated as appropriate:  allergies, current medications, past family history, past medical history,  past surgical history, past social history  and problem list.  Visual acuity was not assessed per patient preference since she has regular follow up with her ophthalmologist. Hearing and body mass index were assessed and reviewed.   During the course of the visit the patient was educated and counseled about appropriate screening and preventive services including : fall prevention , diabetes screening, nutrition counseling, colorectal cancer screening, and recommended immunizations.    CC: The primary encounter diagnosis was Type 2 diabetes mellitus with complication, without long-term current use of insulin ([place]). Diagnoses of Need for prophylactic vaccination against measles, mumps, rubella and varicella, Morbid obesity ([place]), Essential hypertension, Hyperlipidemia with target LDL less than 70, and Encounter for preventive health examination were also pertinent to this visit.  1) uncontrolled type 2 DM last a1c was 8.3  Taking  5 mg glipzide twice daily .  Tincreaed dose to 10 mg and cbgs are still > 200 and up to 455  after thanksgiving .  fastings are still 250.  Really thirsty all the time.  Can't tolerate metformin  discussed trulicity   2) Has started working again after being on disability since 2008  For Meniere's and FM/  Processing foreclosures.  Had good eye exam     History Briell has a past medical history of Allergy, Anaphylaxis (2007), Chronic headaches, Colon polyps, Depression, Diabetes mellitus without complication ([place]), Diverticulosis, Fibromyalgia (2004), Gallstones, Hepatitis B virus infection, Irritable bowel syndrome (IBS), Meniere's disease, Neuromuscular disorder ([place]), and  Sleep apnea.   She has a past surgical history that includes Knee arthroscopy (Left, 2004); Trigger finger release (Bilateral); Cholecystectomy; Abdominal hysterectomy (2000); and Pilonidal cyst excision (1983).   Her family  history includes Colon cancer in her paternal grandmother; Colon polyps in her father and paternal uncle; Diabetes in her father; Heart disease in her maternal aunt, maternal grandmother, and maternal uncle; Stroke in her maternal grandmother.She reports that she quit smoking about 15 years ago. Her smoking use included cigarettes. She has a 20.00 pack-year smoking history. She has never used smokeless tobacco. She reports current alcohol use. She reports that she does not use drugs.  Outpatient Medications Prior to Visit  Medication Sig Dispense Refill  . CVS D3 1000 units capsule TAKE 2 CAPSULES BY MOUTH DAILY 120 capsule 1  . EPINEPHrine (EPIPEN) 0.3 mg/0.3 mL DEVI Inject 0.3 mLs (0.3 mg total) into the muscle once. 1 Device 6  . fluticasone (FLONASE) 50 MCG/ACT nasal spray Place 2 sprays into the nose daily as needed.     Marland Kitchen glipiZIDE (GLUCOTROL) 5 MG tablet Take 2 tablets (10 mg total) by mouth 2 (two) times daily before a meal. (Patient taking differently: Take 10 mg by mouth 2 (two) times daily before a meal. Pt is taking 1/2 tablet, 5mg  BID) 180 tablet 2  . hyoscyamine (ANASPAZ) 0.125 MG TBDP disintergrating tablet Place 1 tablet (0.125 mg total) under the tongue every 4 (four) hours as needed for cramping. 60 tablet 5  . Lactobacillus (PROBIOTIC ACIDOPHILUS PO) Take 1 capsule by mouth 2 (two) times daily.    . meclizine (ANTIVERT) 25 MG tablet TAKE 1 TABLET BY MOUTH TWICE DAILY AS NEEDED. 30 tablet 4  . Red Yeast Rice Extract (CVS RED YEAST RICE) 600 MG CAPS Take 1 capsule (600 mg total) by mouth 2 (two) times daily after a meal. 180 capsule 1  . triamterene-hydrochlorothiazide (MAXZIDE-25) 37.5-25 MG tablet Take 1 tablet by mouth daily. 210 tablet 0  . fexofenadine (ALLEGRA) 180 MG tablet Take 1 tablet (180 mg total) by mouth daily. (Patient taking differently: Take 180 mg by mouth daily as needed. ) 30 tablet 5   No facility-administered medications prior to visit.     Review of Systems    Patient denies headache, fevers, malaise, unintentional weight loss, skin rash, eye pain, sinus congestion and sinus pain, sore throat, dysphagia,  hemoptysis , cough, dyspnea, wheezing, chest pain, palpitations, orthopnea, edema, abdominal pain, nausea, melena, diarrhea, constipation, flank pain, dysuria, hematuria, urinary  Frequency, nocturia, numbness, tingling, seizures,  Focal weakness, Loss of consciousness,  Tremor, insomnia, depression, anxiety, and suicidal ideation.      Objective:  BP 122/70 (BP Location: Left Arm, Patient Position: Sitting, Cuff Size: Large)   Pulse 67   Temp 97.9 F (36.6 C) (Oral)   Resp 14   Ht 5\' 3"  (1.6 m)   Wt 221 lb 6.4 oz (100.4 kg)   SpO2 97%   BMI 39.22 kg/m   Physical Exam   General appearance: alert, cooperative and appears stated age Head: Normocephalic, without obvious abnormality, atraumatic Eyes: conjunctivae/corneas clear. PERRL, EOM's intact. Fundi benign. Ears: normal TM's and external ear canals both ears Nose: Nares normal. Septum midline. Mucosa normal. No drainage or sinus tenderness. Throat: lips, mucosa, and tongue normal; teeth and gums normal Neck: no adenopathy, no carotid bruit, no JVD, supple, symmetrical, trachea midline and thyroid not enlarged, symmetric, no tenderness/mass/nodules Lungs: clear to auscultation bilaterally Breasts: normal appearance, no masses or tenderness Heart: regular  rate and rhythm, S1, S2 normal, no murmur, click, rub or gallop Abdomen: soft, non-tender; bowel sounds normal; no masses,  no organomegaly Extremities: extremities normal, atraumatic, no cyanosis or edema Pulses: 2+ and symmetric Skin: Skin color, texture, turgor normal. No rashes or lesions Neurologic: Alert and oriented X 3, normal strength and tone. Normal symmetric reflexes. Normal coordination and gait.     Assessment & Plan:   Problem List Items Addressed This Visit    Encounter for preventive health examination    age  appropriate education and counseling updated, referrals for preventative services and immunizations addressed, dietary and smoking counseling addressed, most recent labs reviewed.  I have personally reviewed and have noted:  1) the patient's medical and social history 2) The pt's use of alcohol, tobacco, and illicit drugs 3) The patient's current medications and supplements 4) Functional ability including ADL's, fall risk, home safety risk, hearing and visual impairment 5) Diet and physical activities 6) Evidence for depression or mood disorder 7) The patient's height, weight, and BMI have been recorded in the chart  I have made referrals, and provided counseling and education based on review of the above      Essential hypertension    Well controlled on current regimen. Renal function stable, no changes today.  Lab Results  Component Value Date   CREATININE 0.8 10/28/2017   Lab Results  Component Value Date   NA 141 10/28/2017   K 4.6 10/28/2017   CL 103 06/29/2016   CO2 28 06/29/2016         Hyperlipidemia with target LDL less than 70    She is aware of her increased  risk of clinically significant Coronary artery disease to 19% over the next 10 years, despite  using Red Yeast Rice (calculation done using the  the Framingham risk calculator).   She prefers to avoid statin therapy .  Encouraged to take baby aspirin today   Lab Results  Component Value Date   CHOL 236 (A) 10/28/2017   HDL 45 10/28/2017   LDLCALC 142 10/28/2017   LDLDIRECT 116.0 06/29/2016   TRIG 244 (A) 10/28/2017   CHOLHDL 4 06/29/2016         Morbid obesity ([place])    I have congratulated her in reduction of   BMI and encouraged  Continued weight loss with goal of 10% of body weigh over the next 6 months using a low glycemic index diet and regular exercise a minimum of 5 days per week.        Relevant Medications   Dulaglutide (TRULICITY) 2.42 AS/3.4HD SOPN   Type 2 diabetes mellitus with  complication, without long-term current use of insulin ([place]) - Primary    Not controlled on 5 mg glipizde bid.  10 mg dose was also treid with no change.  Starting Trulicity .   Lab Results  Component Value Date   HGBA1C 8.3 (A) 10/28/2017         Relevant Medications   Dulaglutide (TRULICITY) 6.22 WL/7.9GX SOPN   Other Relevant Orders   Microalbumin / creatinine urine ratio (Completed)   Hemoglobin A1c   Comprehensive metabolic panel   Lipid panel    Other Visit Diagnoses    Need for prophylactic vaccination against measles, mumps, rubella and varicella       Relevant Orders   Measles/Mumps/Rubella Immunity      I am having [employer] start on Dulaglutide. I am also having her maintain her EPINEPHrine, fluticasone, fexofenadine, Red  Yeast Rice Extract, CVS D3, meclizine, Lactobacillus (PROBIOTIC ACIDOPHILUS PO), triamterene-hydrochlorothiazide, glipiZIDE, and hyoscyamine.  Meds ordered this encounter  Medications  . Dulaglutide (TRULICITY) 0.03 KJ/1.7HX SOPN    Sig: Inject 0.75 mg into the skin once a week. inject 0.75 mg into skin once weekly    Dispense:  2 mL    Refill:  2    There are no discontinued medications.  Follow-up: No follow-ups on file.   Crecencio Mc, MD

## 2018-01-06 NOTE — Assessment & Plan Note (Signed)
Not controlled on 5 mg glipizde bid.  10 mg dose was also treid with no change.  Starting Trulicity .   Lab Results  Component Value Date   HGBA1C 8.3 (A) 10/28/2017

## 2018-01-08 NOTE — Assessment & Plan Note (Signed)
I have congratulated her in reduction of   BMI and encouraged  Continued weight loss with goal of 10% of body weigh over the next 6 months using a low glycemic index diet and regular exercise a minimum of 5 days per week.    

## 2018-01-08 NOTE — Assessment & Plan Note (Signed)
She is aware of her increased  risk of clinically significant Coronary artery disease to 19% over the next 10 years, despite  using Red Yeast Rice (calculation done using the  the Framingham risk calculator).   She prefers to avoid statin therapy .  Encouraged to take baby aspirin today   Lab Results  Component Value Date   CHOL 236 (A) 10/28/2017   HDL 45 10/28/2017   LDLCALC 142 10/28/2017   LDLDIRECT 116.0 06/29/2016   TRIG 244 (A) 10/28/2017   CHOLHDL 4 06/29/2016

## 2018-01-08 NOTE — Assessment & Plan Note (Signed)
Well controlled on current regimen. Renal function stable, no changes today.  Lab Results  Component Value Date   CREATININE 0.8 10/28/2017   Lab Results  Component Value Date   NA 141 10/28/2017   K 4.6 10/28/2017   CL 103 06/29/2016   CO2 28 06/29/2016

## 2018-01-08 NOTE — Assessment & Plan Note (Signed)

## 2018-02-01 ENCOUNTER — Encounter: Payer: Self-pay | Admitting: Gastroenterology

## 2018-02-01 ENCOUNTER — Ambulatory Visit: Payer: Medicare HMO | Admitting: Gastroenterology

## 2018-02-01 VITALS — BP 117/78 | HR 73 | Ht 63.0 in | Wt 222.2 lb

## 2018-02-01 DIAGNOSIS — K319 Disease of stomach and duodenum, unspecified: Secondary | ICD-10-CM

## 2018-02-01 DIAGNOSIS — K2970 Gastritis, unspecified, without bleeding: Secondary | ICD-10-CM | POA: Diagnosis not present

## 2018-02-01 DIAGNOSIS — K760 Fatty (change of) liver, not elsewhere classified: Secondary | ICD-10-CM | POA: Diagnosis not present

## 2018-02-01 DIAGNOSIS — R1013 Epigastric pain: Secondary | ICD-10-CM

## 2018-02-01 DIAGNOSIS — K2990 Gastroduodenitis, unspecified, without bleeding: Secondary | ICD-10-CM | POA: Diagnosis not present

## 2018-02-01 DIAGNOSIS — Z1211 Encounter for screening for malignant neoplasm of colon: Secondary | ICD-10-CM | POA: Diagnosis not present

## 2018-02-01 DIAGNOSIS — Z86010 Personal history of colonic polyps: Secondary | ICD-10-CM

## 2018-02-01 DIAGNOSIS — K5900 Constipation, unspecified: Secondary | ICD-10-CM

## 2018-02-01 MED ORDER — DULAGLUTIDE 0.75 MG/0.5ML ~~LOC~~ SOAJ: 1.5000 mg | SUBCUTANEOUS | 2 refills | Status: DC

## 2018-02-01 NOTE — Progress Notes (Signed)
Barbara Wade  [place], [place] [address]  Main: [phone]  Fax: [phone]   Gastroenterology Consultation  Referring Provider:     Crecencio Mc, MD Primary Care Physician:  Crecencio Mc, MD Reason for Consultation:   History of colon polyps, history of IBS with constipation and diarrhea        HPI:    Chief Complaint  Patient presents with  . New Patient (Initial Visit)    referral from Dr. Derrel Nip for IBS, FH colon cancer, personal hx polyps     Barbara Wade is a 58 y.o. y/o female referred for consultation & management  by Dr. Crecencio Mc, MD.  Patient reports history of colon polyps.  Also reports history of IBS and states has chronically had alternating constipation and diarrhea with constipation predominant.  Does not use any medications for bowel movements.  Reports has bowel movements that are hard and she has to push to evacuate.  No blood in stool.  Tries to eat a high-fiber diet.  Symptoms do not bother her and she does not have any complaints.  No weight loss. The patient denies abdominal or flank pain, anorexia, nausea or vomiting, dysphagia, change in bowel habits or black or bloody stools or weight loss.  Patient previously evaluated by Dr. Ardis Hughs of gastroenterology Patient underwent EGD with Dr. Ardis Hughs in 2017 and erythema and linear erosions were noted in the stomach.  Biopsy showed reactive gastropathy and no H. Pylori.  I have reviewed the images from the EGD report myself and on my review the gastric images do show significant erythema.  Patient states at that time EGD was done as she felt a lump at her xiphoid notch.  She otherwise denies any abdominal pain, dysphagia or heartburn.  Colonoscopy October 2011 due to history of colon polyps and menorrhagia.  5 subcentimeter polyps removed and diverticulosis reported.  Pathology report not available.  Recommendation on the colonoscopy report was to repeat  colonoscopy in 2 years.  As per Dr. Ardis Hughs note:  "CT scan 10/2014 done for LLQ pains:  1. No evidence of acute inflammatory process within abdomen or pelvis. 2. Significant fatty infiltration of the liver. Status postcholecystectomy. 3. Moderate stool throughout the colon. No pericecal inflammation. No colitis. Normal appendix. No pericecal inflammation. 4. Sigmoid colon diverticula. No evidence of acute diverticulitis. 5. Status post hysterectomy.6. No hydronephrosis or hydroureter. Right renal cyst measures 1.8 cm.  10/2013 UGI with SBFT: done for epigastric fullness: Small HH, otherwise normal."  Latest lab work shows mildly elevated AST to 37 in October 2019.  Liver enzymes normal previously but were elevated in 2016 and 2017.  Past Medical History:  Diagnosis Date  . Allergy   . Anaphylaxis 2007   occurred during allergy testing to environmental allergen  . Chronic headaches   . Colon polyps   . Depression    managed currently with [facility]  . Diabetes mellitus without complication ([place])   . Diverticulosis   . Fibromyalgia 2004  . Gallstones   . Hepatitis B virus infection   . Irritable bowel syndrome (IBS)   . Meniere's disease   . Neuromuscular disorder ([place])   . Sleep apnea     Past Surgical History:  Procedure Laterality Date  . ABDOMINAL HYSTERECTOMY  2000   for fibroids  . CHOLECYSTECTOMY     elective  . KNEE ARTHROSCOPY Left 2004  . PILONIDAL CYST EXCISION  1983  .  TRIGGER FINGER RELEASE Bilateral     Prior to Admission medications   Medication Sig Start Date End Date Taking? Authorizing Provider  CVS D3 1000 units capsule TAKE 2 CAPSULES BY MOUTH DAILY 06/10/15   Crecencio Mc, MD  Dulaglutide (TRULICITY) 1.01 BP/1.0CH SOPN Inject 0.75 mg into the skin once a week. inject 0.75 mg into skin once weekly 01/06/18   Crecencio Mc, MD  EPINEPHrine (EPIPEN) 0.3 mg/0.3 mL DEVI Inject 0.3 mLs (0.3 mg total) into the muscle once. 05/11/11   Crecencio Mc,  MD  fexofenadine (ALLEGRA) 180 MG tablet Take 1 tablet (180 mg total) by mouth daily. Patient taking differently: Take 180 mg by mouth daily as needed.  04/08/14 06/30/15  Crecencio Mc, MD  fluticasone (FLONASE) 50 MCG/ACT nasal spray Place 2 sprays into the nose daily as needed.  07/14/11   Crecencio Mc, MD  glipiZIDE (GLUCOTROL) 5 MG tablet Take 2 tablets (10 mg total) by mouth 2 (two) times daily before a meal. Patient taking differently: Take 10 mg by mouth 2 (two) times daily before a meal. Pt is taking 1/2 tablet, 5mg  BID 11/21/17   Crecencio Mc, MD  hyoscyamine (ANASPAZ) 0.125 MG TBDP disintergrating tablet Place 1 tablet (0.125 mg total) under the tongue every 4 (four) hours as needed for cramping. 11/21/17   Crecencio Mc, MD  Lactobacillus (PROBIOTIC ACIDOPHILUS PO) Take 1 capsule by mouth 2 (two) times daily.    [provider]  meclizine (ANTIVERT) 25 MG tablet TAKE 1 TABLET BY MOUTH TWICE DAILY AS NEEDED. 05/13/16   Crecencio Mc, MD  Red Yeast Rice Extract (CVS RED YEAST RICE) 600 MG CAPS Take 1 capsule (600 mg total) by mouth 2 (two) times daily after a meal. 05/15/14   Crecencio Mc, MD  triamterene-hydrochlorothiazide (MAXZIDE-25) 37.5-25 MG tablet Take 1 tablet by mouth daily. 01/19/17   Crecencio Mc, MD    Family History  Problem Relation Age of Onset  . Heart disease Maternal Aunt   . Heart disease Maternal Uncle   . Heart disease Maternal Grandmother   . Stroke Maternal Grandmother   . Diabetes Father   . Colon cancer Paternal Grandmother   . Colon polyps Father   . Colon polyps Paternal Uncle      Social History   Tobacco Use  . Smoking status: Former Smoker    Packs/day: 1.00    Years: 20.00    Pack years: 20.00    Types: Cigarettes    Last attempt to quit: 10/16/2002    Years since quitting: 15.3  . Smokeless tobacco: Never Used  Substance Use Topics  . Alcohol use: Yes    Alcohol/week: 0.0 - 1.0 standard drinks    Comment: occasional   . Drug use: No    Allergies as of 02/01/2018 - Review Complete 01/06/2018  Allergen Reaction Noted  . Hydrocodone Hives 10/16/2010  . Plum pulp Anaphylaxis 10/21/2015  . Prunus persica Anaphylaxis 10/21/2015    Review of Systems:    All systems reviewed and negative except where noted in HPI.   Physical Exam:   Vitals:   02/01/18 1314  BP: 117/78  Pulse: 73  Weight: 222 lb 3.2 oz (100.8 kg)  Height: 5\' 3"  (1.6 m)    No LMP recorded. Patient has had a hysterectomy. Psych:  Alert and cooperative. Normal mood and affect. General:   Alert,  Well-developed, well-nourished, pleasant and cooperative in NAD Head:  Normocephalic and atraumatic.  Eyes:  Sclera clear, no icterus.   Conjunctiva pink. Ears:  Normal auditory acuity. Nose:  No deformity, discharge, or lesions. Mouth:  No deformity or lesions,oropharynx pink & moist. Neck:  Supple; no masses or thyromegaly. Abdomen:  Normal bowel sounds.  No bruits.  Soft, non-tender and non-distended without masses, hepatosplenomegaly or hernias noted.  No guarding or rebound tenderness.    Msk:  Symmetrical without gross deformities. Good, equal movement & strength bilaterally. Pulses:  Normal pulses noted. Extremities:  No clubbing or edema.  No cyanosis. Neurologic:  Alert and oriented x3;  grossly normal neurologically. Skin:  Intact without significant lesions or rashes. No jaundice. Lymph Nodes:  No significant cervical adenopathy. Psych:  Alert and cooperative. Normal mood and affect.   Labs: CBC    Component Value Date/Time   WBC 7.2 10/28/2017   WBC 6.6 01/27/2015 1543   RBC 4.57 01/27/2015 1543   HGB 14.9 10/28/2017   HCT 43 10/28/2017   PLT 269 10/28/2017   MCV 92.8 01/27/2015 1543   MCH 32.3 01/27/2015 1543   MCHC 34.8 01/27/2015 1543   RDW 12.9 01/27/2015 1543   LYMPHSABS 2.4 11/19/2014 1726   MONOABS 0.6 11/19/2014 1726   EOSABS 0.2 11/19/2014 1726   BASOSABS 0.0 11/19/2014 1726   CMP     Component Value  Date/Time   NA 141 10/28/2017   K 4.6 10/28/2017   CL 103 06/29/2016 1015   CO2 28 06/29/2016 1015   GLUCOSE 193 (H) 06/29/2016 1015   BUN 13 10/28/2017   CREATININE 0.8 10/28/2017   CREATININE 0.81 06/29/2016 1015   CREATININE 0.69 10/16/2010 1635   CALCIUM 9.5 06/29/2016 1015   PROT 7.0 06/29/2016 1015   ALBUMIN 4.0 06/29/2016 1015   AST 37 (A) 10/28/2017   ALT 30 10/28/2017   ALKPHOS 121 10/28/2017   BILITOT 0.6 06/29/2016 1015   GFRNONAA >60 01/27/2015 1543   GFRAA >60 01/27/2015 1543    Imaging Studies: No results found.  Assessment and Plan:   Barbara Wade is a 58 y.o. y/o female has been referred for history of colon polyps and history of alternating constipation and diarrhea  Patient tries to eat a high-fiber diet but continues to have constipation, followed by diarrhea.  She likely is having postobstructive diarrhea. High-fiber diet MiraLAX daily with goal of 1-2 soft bowel movements daily.  If not at goal, patient instructed to increase dose to twice daily.  If loose stools with the medication, patient asked to decrease the medication to every other day, or half dose daily.  Patient verbalized understanding  Her last colonoscopy was in 2011 and polyps were removed with biopsy reports from polyps not available to [place].  She is also due for a colonoscopy.  And is agreeable with proceeding.  In addition, after I reviewed the EGD images myself, I did see significant erythema in the stomach.  She does not have any abdominal pain but given the finding of significant erythema previously with an unknown cause and patient has not been on any PPI, I discussed evaluation with EGD to ensure this is resolved and obtain biopsies.  She is agreeable with proceeding with the same.  I have discussed alternative options, risks & benefits,  which include, but are not limited to, bleeding, infection, perforation,respiratory complication & drug reaction.  The patient agrees with this  plan & written consent will be obtained.    Patient's AST was mildly elevated in October 2019 labs, and her CT showed  fatty liver in 2016.  The elevation in her liver enzyme is likely due to underlying fatty liver.  Finding of fatty liver on imaging discussed with patient Diet, weight loss, and exercise encouraged along with avoiding hepatotoxic drugs including alcohol Risk of progression to cirrhosis if above measures are not instituted were discussed as well, and patient verbalized understanding  Patient states she was immunized for hepatitis B back in 2000. We will check for immunity to hepatitis A and B.  If not immune to hepatitis B we will refer to her primary care provider for booster, and also for hepatitis A.  We will also check for hepatitis C.      Dr Barbara Antigua  Speech recognition software was used to dictate the above note.

## 2018-02-01 NOTE — Telephone Encounter (Signed)
Copied from [place] [phone]. Topic: Quick Communication - See Telephone Encounter >> Feb 01, 2018 12:47 PM Vernona Rieger wrote: CRM for notification. See Telephone encounter for: 02/01/18.  Patient states that she used the last of the Dulaglutide (TRULICITY) 8.25 KN/3.9JQ SOPN on Friday and needs a refill. She said her next dosage is this Friday. Please Advise. [facility].

## 2018-02-01 NOTE — Patient Instructions (Signed)

## 2018-02-02 LAB — HEPATITIS B SURFACE ANTIBODY,QUALITATIVE: HEP B SURFACE AB, QUAL: REACTIVE

## 2018-02-02 LAB — HCV COMMENT:

## 2018-02-02 LAB — HEPATITIS C ANTIBODY (REFLEX): HCV Ab: <0.1 s/co ratio (ref 0.0–0.9)

## 2018-02-02 LAB — HEPATITIS A ANTIBODY, TOTAL: Hep A Total Ab: NEGATIVE

## 2018-02-02 LAB — HEPATITIS B CORE ANTIBODY, TOTAL: Hep B Core Total Ab: POSITIVE — AB

## 2018-02-02 LAB — HEPATITIS A ANTIBODY, IGM: Hep A IgM: NEGATIVE

## 2018-02-02 LAB — HEPATITIS B SURFACE ANTIGEN: Hepatitis B Surface Ag: NEGATIVE

## 2018-02-03 ENCOUNTER — Telehealth: Payer: Self-pay | Admitting: Internal Medicine

## 2018-02-03 MED ORDER — DULAGLUTIDE 1.5 MG/0.5ML ~~LOC~~ SOAJ: 1.5000 mg | SUBCUTANEOUS | 2 refills | Status: DC

## 2018-02-03 NOTE — Telephone Encounter (Signed)
Copied from [place] [id number]. Topic: Quick Communication - Rx Refill/Question >> Feb 03, 2018  8:21 AM Rayann Heman wrote: Medication:Dulaglutide (TRULICITY) 4.43 XV/4.0GQ [facility] [[id number]]  pt states that medication was sent in wrong and needs to be the 1.5 dosage. Please advise

## 2018-02-03 NOTE — Telephone Encounter (Signed)
Sent correct pen in for patient per PCP note from My chart message dated 01/31/18,

## 2018-02-07 ENCOUNTER — Other Ambulatory Visit: Payer: Self-pay | Admitting: Internal Medicine

## 2018-02-08 MED ORDER — HYOSCYAMINE SULFATE 0.125 MG PO TBDP: 0.1250 mg | ORAL_TABLET | ORAL | 5 refills | Status: DC | PRN

## 2018-02-08 MED ORDER — MECLIZINE HCL 25 MG PO TABS: 25.0000 mg | ORAL_TABLET | Freq: Two times a day (BID) | ORAL | 4 refills | Status: AC

## 2018-02-15 ENCOUNTER — Telehealth: Payer: Self-pay | Admitting: Gastroenterology

## 2018-02-15 DIAGNOSIS — K760 Fatty (change of) liver, not elsewhere classified: Secondary | ICD-10-CM | POA: Diagnosis not present

## 2018-02-15 NOTE — Telephone Encounter (Signed)
LVM asking pt to please stop by office and pick up a bowel prep kit

## 2018-02-15 NOTE — Telephone Encounter (Signed)
Pt is returning a call to schedule a test that Dr. Darene Lamer wanted to add

## 2018-02-15 NOTE — Telephone Encounter (Signed)
Please call in another prep for patient other than the Su-prep,because of the expense. Please call to [facility] in [place] . Procedure is  Scheduled 02-20-2018.

## 2018-02-15 NOTE — Telephone Encounter (Signed)
Pt to come by office this pm for lab draw and states she has not received any Hep B vaccines so I asked her to hold seeing PCP until I let Dr. Bonna Gains know.

## 2018-02-16 LAB — HEPATITIS B CORE ANTIBODY, IGM: Hep B C IgM: NEGATIVE

## 2018-02-17 ENCOUNTER — Encounter: Payer: Self-pay | Admitting: *Deleted

## 2018-02-20 ENCOUNTER — Other Ambulatory Visit: Payer: Self-pay | Admitting: Gastroenterology

## 2018-02-20 ENCOUNTER — Ambulatory Visit
Admission: RE | Admit: 2018-02-20 | Discharge: 2018-02-20 | Disposition: A | Discharge status: Home / Self Care | Payer: Medicare HMO | Attending: Gastroenterology | Admitting: Gastroenterology

## 2018-02-20 ENCOUNTER — Ambulatory Visit: Payer: Medicare HMO | Admitting: Anesthesiology

## 2018-02-20 ENCOUNTER — Encounter: Payer: Self-pay | Admitting: Anesthesiology

## 2018-02-20 ENCOUNTER — Encounter
Admission: RE | Disposition: A | Discharge status: Home / Self Care | Payer: Self-pay | Source: Home / Self Care | Attending: Gastroenterology

## 2018-02-20 DIAGNOSIS — Z885 Allergy status to narcotic agent status: Secondary | ICD-10-CM | POA: Insufficient documentation

## 2018-02-20 DIAGNOSIS — Z91018 Allergy to other foods: Secondary | ICD-10-CM | POA: Insufficient documentation

## 2018-02-20 DIAGNOSIS — Z7984 Long term (current) use of oral hypoglycemic drugs: Secondary | ICD-10-CM | POA: Diagnosis not present

## 2018-02-20 DIAGNOSIS — Z87891 Personal history of nicotine dependence: Secondary | ICD-10-CM | POA: Insufficient documentation

## 2018-02-20 DIAGNOSIS — D124 Benign neoplasm of descending colon: Secondary | ICD-10-CM

## 2018-02-20 DIAGNOSIS — K228 Other specified diseases of esophagus: Secondary | ICD-10-CM

## 2018-02-20 DIAGNOSIS — K2270 Barrett's esophagus without dysplasia: Secondary | ICD-10-CM | POA: Diagnosis not present

## 2018-02-20 DIAGNOSIS — Z87892 Personal history of anaphylaxis: Secondary | ICD-10-CM | POA: Diagnosis not present

## 2018-02-20 DIAGNOSIS — K5730 Diverticulosis of large intestine without perforation or abscess without bleeding: Secondary | ICD-10-CM

## 2018-02-20 DIAGNOSIS — K589 Irritable bowel syndrome without diarrhea: Secondary | ICD-10-CM | POA: Diagnosis not present

## 2018-02-20 DIAGNOSIS — H8109 Meniere's disease, unspecified ear: Secondary | ICD-10-CM | POA: Diagnosis not present

## 2018-02-20 DIAGNOSIS — F329 Major depressive disorder, single episode, unspecified: Secondary | ICD-10-CM | POA: Insufficient documentation

## 2018-02-20 DIAGNOSIS — K219 Gastro-esophageal reflux disease without esophagitis: Secondary | ICD-10-CM | POA: Diagnosis not present

## 2018-02-20 DIAGNOSIS — Z1211 Encounter for screening for malignant neoplasm of colon: Secondary | ICD-10-CM

## 2018-02-20 DIAGNOSIS — K2990 Gastroduodenitis, unspecified, without bleeding: Secondary | ICD-10-CM

## 2018-02-20 DIAGNOSIS — Z79899 Other long term (current) drug therapy: Secondary | ICD-10-CM | POA: Insufficient documentation

## 2018-02-20 DIAGNOSIS — K2970 Gastritis, unspecified, without bleeding: Secondary | ICD-10-CM | POA: Diagnosis not present

## 2018-02-20 DIAGNOSIS — E119 Type 2 diabetes mellitus without complications: Secondary | ICD-10-CM | POA: Diagnosis not present

## 2018-02-20 DIAGNOSIS — M797 Fibromyalgia: Secondary | ICD-10-CM | POA: Diagnosis not present

## 2018-02-20 DIAGNOSIS — R197 Diarrhea, unspecified: Secondary | ICD-10-CM

## 2018-02-20 DIAGNOSIS — Z7951 Long term (current) use of inhaled steroids: Secondary | ICD-10-CM | POA: Insufficient documentation

## 2018-02-20 DIAGNOSIS — Z6838 Body mass index (BMI) 38.0-38.9, adult: Secondary | ICD-10-CM | POA: Insufficient documentation

## 2018-02-20 DIAGNOSIS — I10 Essential (primary) hypertension: Secondary | ICD-10-CM | POA: Diagnosis not present

## 2018-02-20 DIAGNOSIS — R1013 Epigastric pain: Secondary | ICD-10-CM

## 2018-02-20 DIAGNOSIS — Z8719 Personal history of other diseases of the digestive system: Secondary | ICD-10-CM | POA: Diagnosis not present

## 2018-02-20 DIAGNOSIS — Z90710 Acquired absence of both cervix and uterus: Secondary | ICD-10-CM | POA: Diagnosis not present

## 2018-02-20 DIAGNOSIS — K2289 Other specified disease of esophagus: Secondary | ICD-10-CM

## 2018-02-20 DIAGNOSIS — G4730 Sleep apnea, unspecified: Secondary | ICD-10-CM | POA: Insufficient documentation

## 2018-02-20 DIAGNOSIS — R69 Illness, unspecified: Secondary | ICD-10-CM | POA: Diagnosis not present

## 2018-02-20 DIAGNOSIS — K635 Polyp of colon: Secondary | ICD-10-CM | POA: Diagnosis not present

## 2018-02-20 HISTORY — PX: ESOPHAGOGASTRODUODENOSCOPY (EGD) WITH PROPOFOL: SHX5813

## 2018-02-20 HISTORY — PX: COLONOSCOPY WITH PROPOFOL: SHX5780

## 2018-02-20 LAB — GLUCOSE, CAPILLARY: Glucose-Capillary: 127 mg/dL — ABNORMAL HIGH (ref 70–99)

## 2018-02-20 SURGERY — COLONOSCOPY WITH PROPOFOL
Anesthesia: General

## 2018-02-20 MED ORDER — SODIUM CHLORIDE 0.9 % IV SOLN
INTRAVENOUS | Status: DC
  Administered 2018-02-20: 14:00:00 via INTRAVENOUS

## 2018-02-20 MED ORDER — PROPOFOL 10 MG/ML IV BOLUS
INTRAVENOUS | Status: DC | PRN
  Administered 2018-02-20: 20 mg via INTRAVENOUS
  Administered 2018-02-20: 90 mg via INTRAVENOUS
  Administered 2018-02-20: 10 mg via INTRAVENOUS
  Administered 2018-02-20: 20 mg via INTRAVENOUS

## 2018-02-20 MED ORDER — PROPOFOL 500 MG/50ML IV EMUL
INTRAVENOUS | Status: AC
  Filled 2018-02-20: qty 50

## 2018-02-20 MED ORDER — PROPOFOL 500 MG/50ML IV EMUL
INTRAVENOUS | Status: DC | PRN
  Administered 2018-02-20: 130 ug/kg/min via INTRAVENOUS

## 2018-02-20 MED ORDER — PROPOFOL 10 MG/ML IV BOLUS
INTRAVENOUS | Status: AC
  Filled 2018-02-20: qty 20

## 2018-02-20 MED ORDER — PANTOPRAZOLE SODIUM 20 MG PO TBEC: 20.0000 mg | DELAYED_RELEASE_TABLET | Freq: Every day | ORAL | 0 refills | Status: DC

## 2018-02-20 MED ORDER — LIDOCAINE HCL (CARDIAC) PF 100 MG/5ML IV SOSY
PREFILLED_SYRINGE | INTRAVENOUS | Status: DC | PRN
  Administered 2018-02-20: 50 mg via INTRAVENOUS

## 2018-02-20 NOTE — Op Note (Addendum)
[facility] Gastroenterology Patient Name: Barbara Wade Procedure Date: 02/20/2018 2:03 PM MRN: [id number] Account #: [id number] Date of Birth: 1960/02/24 Admit Type: Outpatient Age: 58 Room: [facility] ENDO ROOM 4 Gender: Female Note Status: Finalized Procedure:            Upper GI endoscopy Indications:          Follow-up of gastritis, Diarrhea Providers:            Xanthe Couillard B. Bonna Gains MD, MD Referring MD:         Deborra Medina, MD (Referring MD) Medicines:            Monitored Anesthesia Care Complications:        No immediate complications. Procedure:            Pre-Anesthesia Assessment:                       - Prior to the procedure, a History and Physical was                        performed, and patient medications, allergies and                        sensitivities were reviewed. The patient's tolerance of                        previous anesthesia was reviewed.                       - The risks and benefits of the procedure and the                        sedation options and risks were discussed with the                        patient. All questions were answered and informed                        consent was obtained.                       - Patient identification and proposed procedure were                        verified prior to the procedure by the physician, the                        nurse, the anesthesiologist, the anesthetist and the                        technician. The procedure was verified in the procedure                        room.                       - ASA Grade Assessment: II - A patient with mild                        systemic disease.  After obtaining informed consent, the endoscope was                        passed under direct vision. Throughout the procedure,                        the patient's blood pressure, pulse, and oxygen                        saturations were monitored continuously. The  Endoscope                        was introduced through the mouth, and advanced to the                        second part of duodenum. The upper GI endoscopy was                        accomplished with ease. The patient tolerated the                        procedure well. Findings:      One tongue of salmon-colored mucosa was present from 35 to 36 cm. No       other visible abnormalities were present. The maximum longitudinal       extent of these esophageal mucosal changes was 1 cm in length. Mucosa       was biopsied with a cold forceps for histology in a targeted manner and       in 4 quadrants at intervals of 1 cm.      A single area of ectopic gastric mucosa was found in the proximal       esophagus, 18 cm from the incisors.      The entire examined stomach was normal. Biopsies were obtained in the       gastric body, at the incisura and in the gastric antrum with cold       forceps for histology. Biopsies were taken with a cold forceps for       Helicobacter pylori testing.      The duodenal bulb, second portion of the duodenum and examined duodenum       were normal. Biopsies for histology were taken with a cold forceps for       evaluation of celiac disease. Impression:           - Salmon-colored mucosa suspicious for short-segment                        Barrett's esophagus. Biopsied.                       - Ectopic gastric mucosa in the proximal esophagus.                       - Normal stomach. Biopsied.                       - Normal duodenal bulb, second portion of the duodenum                        and examined duodenum. Biopsied.                       -  Biopsies were obtained in the gastric body, at the                        incisura and in the gastric antrum. Recommendation:       - Await pathology results.                       - Discharge patient to home (with escort).                       - Advance diet as tolerated.                       - Continue present  medications.                       - Patient has a contact number available for                        emergencies. The signs and symptoms of potential                        delayed complications were discussed with the patient.                        Return to normal activities tomorrow. Written discharge                        instructions were provided to the patient.                       - Discharge patient to home (with escort).                       - The findings and recommendations were discussed with                        the patient.                       - The findings and recommendations were discussed with                        the patient's family.                       - Follow an antireflux regimen.                       - Take prescribed proton pump inhibitor or H2 blocker                        (antacid) medications 30 - 60 minutes before meals.                       - Risks of PPI use including but not limited to                        diarrhea, kidney damange, bone loss, pneumonia were                        discussed with the patient. Pt  chooses to continue the                        medication. Benefits and risks of the medication                        discussed with patient and family. Procedure Code(s):    --- Professional ---                       [phone], Esophagogastroduodenoscopy, flexible, transoral;                        with biopsy, single or multiple Diagnosis Code(s):    --- Professional ---                       K22.8, Other specified diseases of esophagus                       K29.70, Gastritis, unspecified, without bleeding                       R19.7, Diarrhea, unspecified CPT copyright 2018 American Medical Association. All rights reserved. The codes documented in this report are preliminary and upon coder review may  be revised to meet current compliance requirements.  Vonda Antigua, MD Margretta Sidle B. Bonna Gains MD, MD 02/20/2018 2:26:39  PM This report has been signed electronically. Number of Addenda: 0 Note Initiated On: 02/20/2018 2:03 PM Estimated Blood Loss: Estimated blood loss: none.      [facility]

## 2018-02-20 NOTE — Anesthesia Preprocedure Evaluation (Signed)
Anesthesia Evaluation  Patient identified by MRN, date of birth, ID band Patient awake    Reviewed: Allergy & Precautions, NPO status , Patient's Chart, lab work & pertinent test results, reviewed documented beta blocker date and time   Airway Mallampati: III  TM Distance: >3 FB     Dental  (+) Chipped   Pulmonary sleep apnea , former smoker,           Cardiovascular hypertension,      Neuro/Psych  Headaches, PSYCHIATRIC DISORDERS Depression  Neuromuscular disease    GI/Hepatic hiatal hernia, GERD  ,(+) Hepatitis -  Endo/Other  diabetes, Type 2Morbid obesity  Renal/GU      Musculoskeletal  (+) Fibromyalgia -  Abdominal   Peds  Hematology   Anesthesia Other Findings EKG ok.  Reproductive/Obstetrics                             Anesthesia Physical Anesthesia Plan  ASA: III  Anesthesia Plan: General   Post-op Pain Management:    Induction: Intravenous  PONV Risk Score and Plan:   Airway Management Planned:   Additional Equipment:   Intra-op Plan:   Post-operative Plan:   Informed Consent: I have reviewed the patients History and Physical, chart, labs and discussed the procedure including the risks, benefits and alternatives for the proposed anesthesia with the patient or authorized representative who has indicated his/her understanding and acceptance.       Plan Discussed with: CRNA  Anesthesia Plan Comments:         Anesthesia Quick Evaluation

## 2018-02-20 NOTE — Anesthesia Post-op Follow-up Note (Signed)
Anesthesia QCDR form completed.        

## 2018-02-20 NOTE — Op Note (Signed)
[facility] Gastroenterology Patient Name: Barbara Wade Procedure Date: 02/20/2018 2:03 PM MRN: [id number] Account #: [id number] Date of Birth: 1960/04/16 Admit Type: Outpatient Age: 58 Room: [facility] ENDO ROOM 4 Gender: Female Note Status: Finalized Procedure:            Colonoscopy Indications:          Screening for colorectal malignant neoplasm, Incidental                        - Clinically significant diarrhea of unexplained origin Providers:            Myrla Malanowski B. Bonna Gains MD, MD Referring MD:         Deborra Medina, MD (Referring MD) Medicines:            Monitored Anesthesia Care Complications:        No immediate complications. Procedure:            Pre-Anesthesia Assessment:                       - Prior to the procedure, a History and Physical was                        performed, and patient medications, allergies and                        sensitivities were reviewed. The patient's tolerance of                        previous anesthesia was reviewed.                       - The risks and benefits of the procedure and the                        sedation options and risks were discussed with the                        patient. All questions were answered and informed                        consent was obtained.                       - Patient identification and proposed procedure were                        verified prior to the procedure by the physician, the                        nurse, the anesthetist and the technician. The                        procedure was verified in the pre-procedure area in the                        procedure room in the endoscopy suite.                       - ASA Grade Assessment: II - A patient with mild  systemic disease.                       - After reviewing the risks and benefits, the patient                        was deemed in satisfactory condition to undergo the    procedure.                       After obtaining informed consent, the colonoscope was                        passed under direct vision. Throughout the procedure,                        the patient's blood pressure, pulse, and oxygen                        saturations were monitored continuously. The                        Colonoscope was introduced through the anus and                        advanced to the the cecum, identified by appendiceal                        orifice and ileocecal valve. The colonoscopy was                        performed with ease. The patient tolerated the                        procedure well. The quality of the bowel preparation                        was fair. Findings:      The perianal and digital rectal examinations were normal.      A few small-mouthed diverticula were found in the transverse colon and       ascending colon.      Multiple diverticula were found in the sigmoid colon.      A 4 mm polyp was found in the descending colon. The polyp was sessile.       The polyp was removed with a cold biopsy forceps. Resection and       retrieval were complete.      A tattoo was seen at 70 cm proximal to the anus. The tattoo site       appeared normal.      The exam was otherwise without abnormality.      The rectum, sigmoid colon, descending colon, transverse colon, ascending       colon and cecum appeared normal. Biopsies for histology were taken with       a cold forceps from the entire colon for evaluation of microscopic       colitis.      The retroflexed view of the distal rectum and anal verge was normal and       showed no anal or rectal abnormalities. Impression:           - Preparation of the colon was fair.                       -  Diverticulosis in the transverse colon and in the                        ascending colon.                       - Diverticulosis in the sigmoid colon.                       - One 4 mm polyp in the descending colon,  removed with                        a cold biopsy forceps. Resected and retrieved.                       - A tattoo was seen at 70 cm proximal to the anus. The                        tattoo site appeared normal.                       - The examination was otherwise normal.                       - The rectum, sigmoid colon, descending colon,                        transverse colon, ascending colon and cecum are normal.                        Biopsied.                       - The distal rectum and anal verge are normal on                        retroflexion view. Recommendation:       - Discharge patient to home.                       - Resume previous diet.                       - Continue present medications.                       - Repeat colonoscopy in 3 years.                       - Return to primary care physician as previously                        scheduled.                       - The findings and recommendations were discussed with                        the patient.                       - The findings and recommendations were discussed with  the patient's family.                       - High fiber diet.                       - Await pathology results. Procedure Code(s):    --- Professional ---                       [phone], Colonoscopy, flexible; with biopsy, single or                        multiple Diagnosis Code(s):    --- Professional ---                       Z12.11, Encounter for screening for malignant neoplasm                        of colon                       D12.4, Benign neoplasm of descending colon                       K57.30, Diverticulosis of large intestine without                        perforation or abscess without bleeding CPT copyright 2018 American Medical Association. All rights reserved. The codes documented in this report are preliminary and upon coder review may  be revised to meet current compliance requirements.  Vonda Antigua, MD Margretta Sidle B. Bonna Gains MD, MD 02/20/2018 3:06:27 PM This report has been signed electronically. Number of Addenda: 0 Note Initiated On: 02/20/2018 2:03 PM Scope Withdrawal Time: 0 hours 13 minutes 55 seconds  Total Procedure Duration: 0 hours 27 minutes 41 seconds  Estimated Blood Loss: Estimated blood loss: none.      [facility]

## 2018-02-20 NOTE — H&P (Signed)
Vonda Antigua, MD [address] [place], [place], [place], [address] [id number], [place], [place], [place], [address] Phone: [phone]  Fax: [phone]  Primary Care Physician:  Crecencio Mc, MD   Pre-Procedure History & Physical: HPI:  Barbara Wade is a 58 y.o. female is here for a colonoscopy and EGD.   Past Medical History:  Diagnosis Date  . Allergy   . Anaphylaxis 2007   occurred during allergy testing to environmental allergen  . Chronic headaches   . Colon polyps   . Depression    managed currently with [facility]  . Diabetes mellitus without complication ([place])   . Diverticulosis   . Fibromyalgia 2004  . Gallstones   . Hepatitis B virus infection   . Irritable bowel syndrome (IBS)   . Meniere's disease   . Neuromuscular disorder ([place])   . Sleep apnea     Past Surgical History:  Procedure Laterality Date  . ABDOMINAL HYSTERECTOMY  2000   for fibroids  . CHOLECYSTECTOMY     elective  . KNEE ARTHROSCOPY Left 2004  . PILONIDAL CYST EXCISION  1983  . TRIGGER FINGER RELEASE Bilateral     Prior to Admission medications   Medication Sig Start Date End Date Taking? Authorizing Provider  [facility] D3 1000 units capsule TAKE 2 CAPSULES BY MOUTH DAILY 06/10/15  Yes Crecencio Mc, MD  Dulaglutide (TRULICITY) 1.5 DG/6.4QI SOPN Inject 1.5 mg into the skin once a week. 02/03/18  Yes Crecencio Mc, MD  fluticasone (FLONASE) 50 MCG/ACT nasal spray Place 2 sprays into the nose daily as needed.  07/14/11  Yes Crecencio Mc, MD  glipiZIDE (GLUCOTROL) 5 MG tablet Take 2 tablets (10 mg total) by mouth 2 (two) times daily before a meal. Patient taking differently: Take 10 mg by mouth 2 (two) times daily before a meal. Pt is taking 1/2 tablet, 5mg  BID 11/21/17  Yes Crecencio Mc, MD  hyoscyamine (ANASPAZ) 0.125 MG TBDP disintergrating tablet Place 1 tablet (0.125 mg total) under the tongue every 4 (four) hours as needed for cramping. 02/08/18  Yes Crecencio Mc, MD  Lactobacillus (PROBIOTIC ACIDOPHILUS PO) Take 1 capsule by mouth 2 (two) times daily.   Yes [provider]  meclizine (ANTIVERT) 25 MG tablet Take 1 tablet (25 mg total) by mouth 2 (two) times daily. As needed for vertigo 02/08/18  Yes Crecencio Mc, MD  polyethylene glycol (MIRALAX / GLYCOLAX) packet Take 17 g by mouth daily.   Yes [provider]  Red Yeast Rice Extract ([employer]S RED YEAST RICE) 600 MG CAPS Take 1 capsule (600 mg total) by mouth 2 (two) times daily after a meal. 05/15/14  Yes Crecencio Mc, MD  triamterene-hydrochlorothiazide (MAXZIDE-25) 37.5-25 MG tablet Take 1 tablet by mouth daily. 01/19/17  Yes Crecencio Mc, MD  EPINEPHrine (EPIPEN) 0.3 mg/0.3 mL DEVI Inject 0.3 mLs (0.3 mg total) into the muscle once. 05/11/11   Crecencio Mc, MD  fexofenadine (ALLEGRA) 180 MG tablet Take 1 tablet (180 mg total) by mouth daily. Patient taking differently: Take 180 mg by mouth daily as needed.  04/08/14 06/30/15  Crecencio Mc, MD    Allergies as of 02/02/2018 - Review Complete 02/01/2018  Allergen Reaction Noted  . Hydrocodone Hives 10/16/2010  . Plum pulp Anaphylaxis 10/21/2015  . Prunus persica Anaphylaxis 10/21/2015    Family History  Problem Relation Age of Onset  . Heart disease Maternal Aunt   .  Heart disease Maternal Uncle   . Heart disease Maternal Grandmother   . Stroke Maternal Grandmother   . Diabetes Father   . Colon polyps Father   . Colon cancer Paternal Grandmother   . Colon polyps Paternal Uncle     Social History   Socioeconomic History  . Marital status: Married    Spouse name: Vania Rea   . Number of children: 2  . Years of education: 16 yrs   . Highest education level: Not on file  Occupational History  . Occupation: disabled    Fish farm manager: unemployed    Comment: sec to meniere's 2009  . Occupation: Cabin crew  Social Needs  . Financial resource strain: Not on file  . Food insecurity:    Worry: Not on file    Inability:  Not on file  . Transportation needs:    Medical: Not on file    Non-medical: Not on file  Tobacco Use  . Smoking status: Former Smoker    Packs/day: 1.00    Years: 20.00    Pack years: 20.00    Types: Cigarettes    Last attempt to quit: 10/16/2002    Years since quitting: 15.3  . Smokeless tobacco: Never Used  Substance and Sexual Activity  . Alcohol use: Yes    Alcohol/week: 0.0 - 1.0 standard drinks    Comment: occasional  . Drug use: No  . Sexual activity: Yes  Lifestyle  . Physical activity:    Days per week: Not on file    Minutes per session: Not on file  . Stress: Not on file  Relationships  . Social connections:    Talks on phone: Not on file    Gets together: Not on file    Attends religious service: Not on file    Active member of club or organization: Not on file    Attends meetings of clubs or organizations: Not on file    Relationship status: Not on file  . Intimate partner violence:    Fear of current or ex partner: Not on file    Emotionally abused: Not on file    Physically abused: Not on file    Forced sexual activity: Not on file  Other Topics Concern  . Not on file  Social History Narrative  . Not on file    Review of Systems: See HPI, otherwise negative ROS  Physical Exam: BP 134/80   Pulse 70   Temp 98.1 F (36.7 C) (Tympanic)   Resp 18   Ht 5\' 3"  (1.6 m)   Wt 97.5 kg   SpO2 98%   BMI 38.09 kg/m  General:   Alert,  pleasant and cooperative in NAD Head:  Normocephalic and atraumatic. Neck:  Supple; no masses or thyromegaly. Lungs:  Clear throughout to auscultation, normal respiratory effort.    Heart:  +S1, +S2, Regular rate and rhythm, No edema. Abdomen:  Soft, nontender and nondistended. Normal bowel sounds, without guarding, and without rebound.   Neurologic:  Alert and  oriented x4;  grossly normal neurologically.  Impression/Plan: Barbara Wade is here for a colonoscopy to be performed for average risk screening and  EGD for history of gastric erythema, diarrhea.  Risks, benefits, limitations, and alternatives regarding the procedures have been reviewed with the patient.  Questions have been answered.  All parties agreeable.   Virgel Manifold, MD  02/20/2018, 1:58 PM

## 2018-02-20 NOTE — Transfer of Care (Signed)
Immediate Anesthesia Transfer of Care Note  Patient: Barbara Wade  Procedure(s) Performed: COLONOSCOPY WITH PROPOFOL (N/A ) ESOPHAGOGASTRODUODENOSCOPY (EGD) WITH PROPOFOL (N/A )  Patient Location: PACU  Anesthesia Type:General  Level of Consciousness: awake, alert  and oriented  Airway & Oxygen Therapy: Patient Spontanous Breathing and Patient connected to nasal cannula oxygen  Post-op Assessment: Report given to RN and Post -op Vital signs reviewed and stable  Post vital signs: Reviewed and stable  Last Vitals:  Vitals Value Taken Time  BP 100/67 02/20/2018  3:02 PM  Temp 36.1 C 02/20/2018  3:01 PM  Pulse 70 02/20/2018  3:02 PM  Resp 24 02/20/2018  3:02 PM  SpO2 95 % 02/20/2018  3:02 PM  Vitals shown include unvalidated device data.  Last Pain:  Vitals:   02/20/18 1501  TempSrc: Tympanic  PainSc: 2          Complications: No apparent anesthesia complications

## 2018-02-21 NOTE — Anesthesia Postprocedure Evaluation (Signed)
Anesthesia Post Note  Patient: Sussan Meter  Procedure(s) Performed: COLONOSCOPY WITH PROPOFOL (N/A ) ESOPHAGOGASTRODUODENOSCOPY (EGD) WITH PROPOFOL (N/A )  Patient location during evaluation: Endoscopy Anesthesia Type: General Level of consciousness: awake and alert Pain management: pain level controlled Vital Signs Assessment: post-procedure vital signs reviewed and stable Respiratory status: spontaneous breathing, nonlabored ventilation, respiratory function stable and patient connected to nasal cannula oxygen Cardiovascular status: blood pressure returned to baseline and stable Postop Assessment: no apparent nausea or vomiting Anesthetic complications: no     Last Vitals:  Vitals:   02/20/18 1511 02/20/18 1521  BP: 110/75 123/74  Pulse: 76 69  Resp: 12 14  Temp:    SpO2: 97% 98%    Last Pain:  Vitals:   02/20/18 1521  TempSrc:   PainSc: 0-No pain                 Tristin Gladman S

## 2018-02-22 ENCOUNTER — Encounter: Payer: Self-pay | Admitting: Gastroenterology

## 2018-02-22 LAB — SURGICAL PATHOLOGY

## 2018-02-27 ENCOUNTER — Telehealth: Payer: Self-pay

## 2018-02-27 NOTE — Telephone Encounter (Signed)
Pt calls stated that after her EGD last week she noticed some mucous that she coughed up, slight cough and now she feels like her throat is like she "smoked a pack of cigarettes and her throat is having burning sensation today". No fever. Please advise.

## 2018-02-28 NOTE — Telephone Encounter (Signed)
Left detailed message that symptoms are not likely associated with her EGD that this is more likely an upper respiratory infection to contact her PCP.

## 2018-04-03 ENCOUNTER — Other Ambulatory Visit: Payer: Self-pay | Admitting: Internal Medicine

## 2018-06-14 DIAGNOSIS — G4733 Obstructive sleep apnea (adult) (pediatric): Secondary | ICD-10-CM | POA: Diagnosis not present

## 2018-06-22 MED ORDER — HYOSCYAMINE SULFATE 0.125 MG PO TBDP: 0.1250 mg | ORAL_TABLET | ORAL | 0 refills | Status: DC | PRN

## 2018-07-09 ENCOUNTER — Other Ambulatory Visit: Payer: Self-pay | Admitting: Internal Medicine

## 2018-08-29 ENCOUNTER — Other Ambulatory Visit: Payer: Self-pay | Admitting: Internal Medicine

## 2018-08-31 ENCOUNTER — Other Ambulatory Visit: Payer: Self-pay

## 2018-08-31 ENCOUNTER — Other Ambulatory Visit: Payer: Self-pay | Admitting: Internal Medicine

## 2018-08-31 MED ORDER — GLIPIZIDE 5 MG PO TABS: 5.0000 mg | ORAL_TABLET | Freq: Two times a day (BID) | ORAL | 0 refills | Status: DC

## 2018-08-31 NOTE — Telephone Encounter (Addendum)
Pt stated that she is taking Glipizide 10mg  1/2 tablet BID.

## 2018-08-31 NOTE — Telephone Encounter (Signed)
Copied from [place] [id number]. Topic: General - Other >> Aug 31, 2018  8:16 AM Barbara Wade A wrote: Reason for CRM: Pt called regarding her medication refill and is concerned about it being denied. Please advise.

## 2018-08-31 NOTE — Telephone Encounter (Signed)
Pt stated her glipizide is 10mg . Please pass along to [place].

## 2018-08-31 NOTE — Telephone Encounter (Signed)
The medication was denied because it was for glipizide 10mg  but what is in the pt's medication list is glipizide 5mg . Is it okay to refill it as the 10mg .

## 2018-08-31 NOTE — Telephone Encounter (Signed)
Called pt and she stated that she isn't sure exactly what mg she is taking but she does know that she cuts it in half and takes half in the morning and half in the evening. Pt stated that she would contact her pharmacy to find out exactly which mg she is getting so we can send in the correct rx.

## 2018-08-31 NOTE — Telephone Encounter (Signed)
What dose is the patient actually taking? Did anybody ask her ? The last dose mentioned in the chart was 10 mg

## 2018-08-31 NOTE — Addendum Note (Signed)
Addended by: Adair Laundry on: 08/31/2018 03:29 PM   Modules accepted: Orders

## 2018-09-07 MED ORDER — GLIPIZIDE 5 MG PO TABS: 5.0000 mg | ORAL_TABLET | Freq: Two times a day (BID) | ORAL | 0 refills | Status: DC

## 2018-10-23 ENCOUNTER — Ambulatory Visit (INDEPENDENT_AMBULATORY_CARE_PROVIDER_SITE_OTHER): Payer: Medicare HMO | Admitting: Internal Medicine

## 2018-10-23 ENCOUNTER — Encounter: Payer: Self-pay | Admitting: Internal Medicine

## 2018-10-23 ENCOUNTER — Other Ambulatory Visit: Payer: Self-pay

## 2018-10-23 VITALS — Ht 63.0 in | Wt 215.0 lb

## 2018-10-23 DIAGNOSIS — K582 Mixed irritable bowel syndrome: Secondary | ICD-10-CM | POA: Diagnosis not present

## 2018-10-23 DIAGNOSIS — E118 Type 2 diabetes mellitus with unspecified complications: Secondary | ICD-10-CM | POA: Diagnosis not present

## 2018-10-23 DIAGNOSIS — L732 Hidradenitis suppurativa: Secondary | ICD-10-CM | POA: Diagnosis not present

## 2018-10-23 DIAGNOSIS — E785 Hyperlipidemia, unspecified: Secondary | ICD-10-CM

## 2018-10-23 DIAGNOSIS — L089 Local infection of the skin and subcutaneous tissue, unspecified: Secondary | ICD-10-CM

## 2018-10-23 MED ORDER — SULFAMETHOXAZOLE-TRIMETHOPRIM 800-160 MG PO TABS: 1.0000 | ORAL_TABLET | Freq: Two times a day (BID) | ORAL | 0 refills | Status: DC

## 2018-10-23 NOTE — Progress Notes (Signed)
Virtual Visit via [email or web address]  This visit type was conducted due to national recommendations for restrictions regarding the COVID-19 pandemic (e.g. social distancing).  This format is felt to be most appropriate for this patient at this time.  All issues noted in this document were discussed and addressed.  No physical exam was performed (except for noted visual exam findings with Video Visits).   I connected with@ on 10/23/18 at  4:00 PM EDT by a video enabled telemedicine application  and verified that I am speaking with the correct person using two identifiers. Location patient: home Location provider: work or home office Persons participating in the virtual visit: patient, provider  I discussed the limitations, risks, security and privacy concerns of performing an evaluation and management service by telephone and the availability of in person appointments. I also discussed with the patient that there may be a patient responsible charge related to this service. The patient expressed understanding and agreed to proceed.  Reason for visit: recurrent boils  HPI:   58 yr old female with type 2 DM lost to follow up for one year presents with recurrent flare  of HS  Involving the axillary area,  groin and under breasts. She note several  Boils currently,  Several have started to sponanteoulsy drain.   T2DM:  Patient does not check blood sugars more than once a week,  Last one was 140 in a fasting state.  Dos not recall any above 200 lr less than 80.  No complaints today.  Taking his medications as directed,  Not exercising on a regular basis or trying to lose weight.  Patient voices awareness  of the foods he/she needs to avoid,  And follows a low GI diet about 50% of the time.  Has not had an annual diabetic eye exam.  Denies numbness and tingling in lower extremities.  Denies hypoglycemic symptoms.  Random and fasting sugars 140 per patient , taking glipizide   Hyperlipidemia:  Stopped RYR several  months ago   IBS:  Reduced episodes since starting the FODMAP diet..   Lab Results  Component Value Date   HGBA1C 8.3 (A) 10/28/2017      ROS: See pertinent positives and negatives per HPI.  Past Medical History:  Diagnosis Date  . Allergy   . Anaphylaxis 2007   occurred during allergy testing to environmental allergen  . Chronic headaches   . Colon polyps   . Depression    managed currently with [facility]  . Diabetes mellitus without complication ([place])   . Diverticulosis   . Fibromyalgia 2004  . Gallstones   . Hepatitis B virus infection   . Irritable bowel syndrome (IBS)   . Meniere's disease   . Neuromuscular disorder ([place])   . Sleep apnea     Past Surgical History:  Procedure Laterality Date  . ABDOMINAL HYSTERECTOMY  2000   for fibroids  . CHOLECYSTECTOMY     elective  . COLONOSCOPY WITH PROPOFOL N/A 02/20/2018   Procedure: COLONOSCOPY WITH PROPOFOL;  Surgeon: Virgel Manifold, MD;  Location: [facility] ENDOSCOPY;  Service: Endoscopy;  Laterality: N/A;  . ESOPHAGOGASTRODUODENOSCOPY (EGD) WITH PROPOFOL N/A 02/20/2018   Procedure: ESOPHAGOGASTRODUODENOSCOPY (EGD) WITH PROPOFOL;  Surgeon: Virgel Manifold, MD;  Location: [facility] ENDOSCOPY;  Service: Endoscopy;  Laterality: N/A;  . KNEE ARTHROSCOPY Left 2004  . PILONIDAL CYST EXCISION  1983  . TRIGGER FINGER RELEASE Bilateral     Family History  Problem Relation Age of Onset  . Heart disease  Maternal Aunt   . Heart disease Maternal Uncle   . Heart disease Maternal Grandmother   . Stroke Maternal Grandmother   . Diabetes Father   . Colon polyps Father   . Colon cancer Paternal Grandmother   . Colon polyps Paternal Uncle     SOCIAL HX:  reports that she quit smoking about 16 years ago. Her smoking use included cigarettes. She has a 20.00 pack-year smoking history. She has never used smokeless tobacco. She reports current alcohol use. She reports that she does not use drugs.   Current Outpatient  Medications:  .  [employer] D3 1000 units capsule, TAKE 2 CAPSULES BY MOUTH DAILY, Disp: 120 capsule, Rfl: 1 .  EPINEPHrine (EPIPEN) 0.3 mg/0.3 mL DEVI, Inject 0.3 mLs (0.3 mg total) into the muscle once., Disp: 1 Device, Rfl: 6 .  fexofenadine (ALLEGRA) 180 MG tablet, Take 1 tablet (180 mg total) by mouth daily. (Patient taking differently: Take 180 mg by mouth daily as needed. ), Disp: 30 tablet, Rfl: 5 .  fluticasone (FLONASE) 50 MCG/ACT nasal spray, Place 2 sprays into the nose daily as needed. , Disp: , Rfl:  .  glipiZIDE (GLUCOTROL) 5 MG tablet, Take 1 tablet (5 mg total) by mouth 2 (two) times daily before a meal., Disp: 180 tablet, Rfl: 0 .  hyoscyamine (ANASPAZ) 0.125 MG TBDP disintergrating tablet, Place 1 tablet (0.125 mg total) under the tongue every 4 (four) hours as needed for cramping., Disp: 60 tablet, Rfl: 0 .  Lactobacillus (PROBIOTIC ACIDOPHILUS PO), Take 1 capsule by mouth 2 (two) times daily., Disp: , Rfl:  .  meclizine (ANTIVERT) 25 MG tablet, Take 1 tablet (25 mg total) by mouth 2 (two) times daily. As needed for vertigo, Disp: 30 tablet, Rfl: 4 .  polyethylene glycol (MIRALAX / GLYCOLAX) packet, Take 17 g by mouth daily., Disp: , Rfl:  .  triamterene-hydrochlorothiazide (MAXZIDE-25) 37.5-25 MG tablet, Take 1 tablet by mouth daily., Disp: 210 tablet, Rfl: 0 .  TRULICITY 1.5 0000000 SOPN, INJECT 1.5MG  SUBQ ONCE A WEEK, Disp: 2 mL, Rfl: 2 .  sulfamethoxazole-trimethoprim (BACTRIM DS) 800-160 MG tablet, Take 1 tablet by mouth 2 (two) times daily., Disp: 14 tablet, Rfl: 0  EXAM:  VITALS per patient if applicable:  GENERAL: alert, oriented, appears well and in no acute distress  HEENT: atraumatic, conjunttiva clear, no obvious abnormalities on inspection of external nose and ears  NECK: normal movements of the head and neck  LUNGS: on inspection no signs of respiratory distress, breathing rate appears normal, no obvious gross SOB, gasping or wheezing  CV: no obvious  cyanosis  MS: moves all visible extremities without noticeable abnormality  PSYCH/NEURO: pleasant and cooperative, no obvious depression or anxiety, speech and thought processing grossly intact  ASSESSMENT AND PLAN:  Discussed the following assessment and plan:  Hyperlipidemia with target LDL less than 70 - Plan: TSH  Irritable bowel syndrome with both constipation and diarrhea  Type 2 diabetes mellitus with complication, without long-term current use of insulin (HCC) - Plan: Hemoglobin A1c, Comprehensive metabolic panel, Lipid panel, Microalbumin / creatinine urine ratio  Recurrent infection of skin - Plan: CBC with Differential/Platelet  Hidradenitis suppurativa  Irritable bowel syndrome (IBS) Reduced frequency of flares since following the Fodmap diet.   Type 2 diabetes mellitus with complication, without long-term current use of insulin ([place]) Overdue for follow up  Labs and OV ordered  contineu glipizide   Hidradenitis suppurativa Treating current flae with Septra.  Dial soap daily,  And once  weekly hibiclens    I provided  25 minutes of non-face-to-face time during this encounter reviewing patient's current problems and post surgeries.  Providing counseling on the above mentioned problems , and coordination  of care . I discussed the assessment and treatment plan with the patient. The patient was provided an opportunity to ask questions and all were answered. The patient agreed with the plan and demonstrated an understanding of the instructions.   The patient was advised to call back or seek an in-person evaluation if the symptoms worsen or if the condition fails to improve as anticipated.  Crecencio Mc, MD

## 2018-10-24 DIAGNOSIS — L732 Hidradenitis suppurativa: Secondary | ICD-10-CM | POA: Insufficient documentation

## 2018-10-24 NOTE — Assessment & Plan Note (Signed)
Overdue for follow up  Labs and OV ordered  contineu glipizide

## 2018-10-24 NOTE — Assessment & Plan Note (Signed)
Treating current flae with Septra.  Dial soap daily,  And once weekly hibiclens

## 2018-10-24 NOTE — Assessment & Plan Note (Signed)
Reduced frequency of flares since following the Fodmap diet.

## 2018-10-31 ENCOUNTER — Other Ambulatory Visit: Payer: Self-pay | Admitting: Internal Medicine

## 2018-10-31 NOTE — Telephone Encounter (Signed)
**  Patient calling and states that she recently saw Dr Derrel Nip and told her that she needed this prescription sent to the pharmacy. Patient has checked with the pharmacy and they have not received this prescription yet. Please advise. **  Medication Refill - Medication: triamterene-hydrochlorothiazide (MAXZIDE-25) 37.5-25 MG tablet   Has the patient contacted their pharmacy? Yes.   (Agent: If no, request that the patient contact the pharmacy for the refill.) (Agent: If yes, when and what did the pharmacy advise?)  Preferred Pharmacy (with phone number or street name): [place], [place]  Agent: Please be advised that RX refills may take up to 3 business days. We ask that you follow-up with your pharmacy.

## 2018-10-31 NOTE — Telephone Encounter (Signed)
Requested medications are due for refill today?  Yes  Requested medications are on the active medication list?  Yes  Last refill -01/19/2017 #210, no refills   Future visit scheduled?  No  Notes to clinic   Has not had filled since 2018, not mentioned in last office note 10/23/2018.  Requested Prescriptions  Pending Prescriptions Disp Refills   triamterene-hydrochlorothiazide (MAXZIDE-25) 37.5-25 MG tablet 210 tablet 0    Sig: Take 1 tablet by mouth daily.     Cardiovascular: Diuretic Combos Failed - 10/31/2018  5:43 PM      Failed - K in normal range and within 360 days    Potassium  Date Value Ref Range Status  10/28/2017 4.6 3.4 - 5.3 Final         Failed - Na in normal range and within 360 days    Sodium  Date Value Ref Range Status  10/28/2017 141 137 - 147 Final         Failed - Cr in normal range and within 360 days    Creatinine  Date Value Ref Range Status  10/28/2017 0.8 0.5 - 1.1 Final   Creat  Date Value Ref Range Status  10/16/2010 0.69 0.50 - 1.10 mg/dL Final   Creatinine, Ser  Date Value Ref Range Status  06/29/2016 0.81 0.40 - 1.20 mg/dL Final         Failed - Ca in normal range and within 360 days    Calcium  Date Value Ref Range Status  06/29/2016 9.5 8.4 - 10.5 mg/dL Final         Passed - Last BP in normal range    BP Readings from Last 1 Encounters:  02/20/18 123/74         Passed - Valid encounter within last 6 months    Recent Outpatient Visits          1 week ago Hyperlipidemia with target LDL less than [place] Crecencio Mc, MD   9 months ago Type 2 diabetes mellitus with complication, without long-term current use of insulin ([place])   [facility] [place] Crecencio Mc, MD   11 months ago Adverse effect of metformin, sequela   [facility] [place] Crecencio Mc, MD   1 year ago Type 2 diabetes mellitus with complication, without long-term current use of insulin ([place])   [facility]  Crecencio Mc, MD   2 years ago Type 2 diabetes mellitus with complication, without long-term current use of insulin [facility])   [place] Crecencio Mc, MD

## 2018-11-03 ENCOUNTER — Other Ambulatory Visit: Payer: Self-pay | Admitting: Internal Medicine

## 2018-11-03 NOTE — Telephone Encounter (Signed)
Pt calling back to check status. Please advise  °

## 2018-11-06 MED ORDER — TRIAMTERENE-HCTZ 37.5-25 MG PO TABS: 1.0000 | ORAL_TABLET | Freq: Every day | ORAL | 3 refills | Status: AC

## 2018-11-16 DIAGNOSIS — Z1231 Encounter for screening mammogram for malignant neoplasm of breast: Secondary | ICD-10-CM | POA: Diagnosis not present

## 2018-11-27 MED ORDER — HYOSCYAMINE SULFATE 0.125 MG PO TBDP: 0.1250 mg | ORAL_TABLET | ORAL | 0 refills | Status: DC | PRN

## 2018-11-30 ENCOUNTER — Telehealth: Payer: Self-pay

## 2018-11-30 NOTE — Telephone Encounter (Signed)
Copied from [place] [id number]. Topic: General - Other >> Nov 30, 2018  3:22 PM Carolyn Stare wrote: Pt said she checked her sugar today and it was 285 and wanted to let Dr Derrel Nip know

## 2018-11-30 NOTE — Telephone Encounter (Signed)
Called patient 3 times.  No answer.  Left message on voice mail for pt to go to urgent care to be evaluated today.  See previous phone note.

## 2018-11-30 NOTE — Telephone Encounter (Signed)
Tried to call pt.  No answer.  LMOM.

## 2018-11-30 NOTE — Telephone Encounter (Signed)
Patient has had labs ordered for diabetes follow up since September and has not had them done.  I cannot advise her based on one blood sugar reading.  If she has not gone to urgent care or Er,  She needs to go to have labs and evaluation done and schedule a follow up

## 2018-11-30 NOTE — Telephone Encounter (Signed)
Called and spoke to patient.  Patient said that she was at an appt today with Dr. Chiquita Loth but did not mention her blood sugar reading or symptoms to at her visit.    Patient said that she is feeling light-headed and unable to concentrate.  Patient said that she checked her bs at 3:15 pm.  Patient hadn't had anything to eat since 8:00 am this morning.  Patient said that she last ate at 3:30 pm but has not checked her bs.  Asked patient to check her bs and would call back for readings.  Called pt back.  No answer from pt.  Left message.  Consulted with Dr. Nicki Reaper who recommended that patient go to urgent care if she is still experiencing symptoms of light-headedness and trouble concentrating.  Called pt again.  No answer.  Left advisement given by Dr.Scott on pt's voice mail to go to urgent care today to be evaluated if still feeling light-headed or trouble concentrating.

## 2018-11-30 NOTE — Telephone Encounter (Signed)
Copied from [place] [id number]. Topic: General - Inquiry >> Nov 30, 2018  5:23 PM Alease Frame wrote: Reason for CRM: patient returning phone call from nurse . Office closed . Please call back  Blood sugar was 291

## 2018-11-30 NOTE — Telephone Encounter (Signed)
Patient called back and left bs reading of 291 with operator.  Called pt back a third time.  No answer.  Left a detail message again advising pt to go to urgent care today to be evaluated and to call office back tomorrow with an update.

## 2018-12-01 ENCOUNTER — Ambulatory Visit (INDEPENDENT_AMBULATORY_CARE_PROVIDER_SITE_OTHER): Payer: Medicare HMO | Admitting: Family Medicine

## 2018-12-01 ENCOUNTER — Other Ambulatory Visit: Payer: Self-pay

## 2018-12-01 ENCOUNTER — Encounter: Payer: Self-pay | Admitting: Family Medicine

## 2018-12-01 DIAGNOSIS — E118 Type 2 diabetes mellitus with unspecified complications: Secondary | ICD-10-CM | POA: Diagnosis not present

## 2018-12-01 DIAGNOSIS — E785 Hyperlipidemia, unspecified: Secondary | ICD-10-CM | POA: Diagnosis not present

## 2018-12-01 DIAGNOSIS — Z23 Encounter for immunization: Secondary | ICD-10-CM | POA: Diagnosis not present

## 2018-12-01 DIAGNOSIS — L089 Local infection of the skin and subcutaneous tissue, unspecified: Secondary | ICD-10-CM | POA: Diagnosis not present

## 2018-12-01 DIAGNOSIS — R69 Illness, unspecified: Secondary | ICD-10-CM | POA: Diagnosis not present

## 2018-12-01 LAB — CBC WITH DIFFERENTIAL/PLATELET
Basophils Absolute: 0 10*3/uL (ref 0.0–0.1)
Basophils Relative: 0.4 % (ref 0.0–3.0)
Eosinophils Absolute: 0.1 10*3/uL (ref 0.0–0.7)
Eosinophils Relative: 0.6 % (ref 0.0–5.0)
HCT: 42.3 % (ref 36.0–46.0)
Hemoglobin: 14.9 g/dL (ref 12.0–15.0)
Lymphocytes Relative: 19.8 % (ref 12.0–46.0)
Lymphs Abs: 1.7 10*3/uL (ref 0.7–4.0)
MCHC: 35.2 g/dL (ref 30.0–36.0)
MCV: 94.9 fl (ref 78.0–100.0)
Monocytes Absolute: 0.6 10*3/uL (ref 0.1–1.0)
Monocytes Relative: 6.8 % (ref 3.0–12.0)
Neutro Abs: 6 10*3/uL (ref 1.4–7.7)
Neutrophils Relative %: 72.4 % (ref 43.0–77.0)
Platelets: 269 10*3/uL (ref 150.0–400.0)
RBC: 4.46 Mil/uL (ref 3.87–5.11)
RDW: 12.7 % (ref 11.5–15.5)
WBC: 8.4 10*3/uL (ref 4.0–10.5)

## 2018-12-01 LAB — COMPREHENSIVE METABOLIC PANEL
ALT: 40 U/L — ABNORMAL HIGH (ref 0–35)
AST: 37 U/L (ref 0–37)
Albumin: 4.1 g/dL (ref 3.5–5.2)
Alkaline Phosphatase: 113 U/L (ref 39–117)
BUN: 15 mg/dL (ref 6–23)
CO2: 26 mEq/L (ref 19–32)
Calcium: 9.5 mg/dL (ref 8.4–10.5)
Chloride: 97 mEq/L (ref 96–112)
Creatinine, Ser: 0.8 mg/dL (ref 0.40–1.20)
GFR: 73.01 mL/min (ref >60.00)
Glucose, Bld: 306 mg/dL — ABNORMAL HIGH (ref 70–99)
Potassium: 4 mEq/L (ref 3.5–5.1)
Sodium: 135 mEq/L (ref 135–145)
Total Bilirubin: 0.6 mg/dL (ref 0.2–1.2)
Total Protein: 6.8 g/dL (ref 6.0–8.3)

## 2018-12-01 LAB — LIPID PANEL
Cholesterol: 219 mg/dL — ABNORMAL HIGH (ref 0–200)
HDL: 39.8 mg/dL (ref >39.00)
NonHDL: 178.83
Total CHOL/HDL Ratio: 5
Triglycerides: 321 mg/dL — ABNORMAL HIGH (ref 0.0–149.0)
VLDL: 64.2 mg/dL — ABNORMAL HIGH (ref 0.0–40.0)

## 2018-12-01 LAB — TSH: TSH: 1.96 u[IU]/mL (ref 0.35–4.50)

## 2018-12-01 LAB — MICROALBUMIN / CREATININE URINE RATIO
Creatinine,U: 61.1 mg/dL
Microalb Creat Ratio: 1.1 mg/g (ref 0.0–30.0)
Microalb, Ur: <0.7 mg/dL (ref 0.0–1.9)

## 2018-12-01 LAB — LDL CHOLESTEROL, DIRECT: Direct LDL: 122 mg/dL

## 2018-12-01 LAB — HEMOGLOBIN A1C: Hgb A1c MFr Bld: 10.7 % — ABNORMAL HIGH (ref 4.6–6.5)

## 2018-12-01 MED ORDER — ONETOUCH ULTRASOFT LANCETS MISC: 2 refills | Status: DC

## 2018-12-01 MED ORDER — ONETOUCH VERIO VI STRP: ORAL_STRIP | 1 refills | Status: AC

## 2018-12-01 NOTE — Telephone Encounter (Signed)
FYI.  Called pt back.  Was able to get in touch with her this am.  She is feeling some better, but not back to feeling normal.  She informed me she had appt with Lauren at 11:30 today.

## 2018-12-01 NOTE — Progress Notes (Signed)
Subjective:    Patient ID: Barbara Wade, female    DOB: 1960/03/27, 58 y.o.   MRN: [id number]  HPI   Patient presents to clinic due to blood sugar issue.  States yesterday she had a elevated reading of 291.  States over the past few days she is felt generally unwell, but cannot quite put her finger onto a reason as to why.  Wondering also if her glucose meter is not working quite right; got the higher reading on her friend's glucose meter and when she checked herself on her regular meter reading was lower than 291 (she cannot remember exact number her meter gave).  Patient saw PCP back in September 2020, lab work orders were placed for future, but patient has yet to get these lab orders done.  There is a note in chart from yesterday from PCP advising patient to get lab orders done so her overall blood sugar control can be assessed.   Denies hypoglycemia  Patient Active Problem List   Diagnosis Date Noted  . Hidradenitis suppurativa 10/24/2018  . CLE (columnar lined esophagus)   . Gastritis without bleeding   . Diarrhea   . Special screening for malignant neoplasms, colon   . Benign neoplasm of descending colon   . Diverticulosis of large intestine without diverticulitis   . Essential hypertension 11/22/2017  . S/P abdominal hysterectomy 07/04/2016  . Xiphoidalgia syndrome 09/23/2015  . Esophageal reflux 11/12/2013  . Sliding hiatal hernia 11/12/2013  . Trigger finger, acquired 09/19/2013  . Postprandial epigastric pain 11/01/2012  . Rosacea 05/02/2012  . Irritable bowel syndrome (IBS)   . Morbid obesity ([place]) 01/31/2012  . Vitamin D deficiency 01/05/2012  . Hand pain, right 12/15/2011  . Pain of left heel 12/15/2011  . Lymphedema of leg 07/17/2011  . Rhinitis, allergic 07/17/2011  . Type 2 diabetes mellitus with complication, without long-term current use of insulin ([place]) 12/02/2010  . Hyperlipidemia with target LDL less than 70 12/02/2010  . Hepatitis B virus  infection   . History of colon polyps 10/16/2010  . Encounter for preventive health examination 10/16/2010  . History of food anaphylaxis   . Fibromyalgia    Social History   Tobacco Use  . Smoking status: Former Smoker    Packs/day: 1.00    Years: 20.00    Pack years: 20.00    Types: Cigarettes    Quit date: 10/16/2002    Years since quitting: 16.1  . Smokeless tobacco: Never Used  Substance Use Topics  . Alcohol use: Yes    Alcohol/week: 0.0 - 1.0 standard drinks    Comment: occasional    Review of Systems  Constitutional: Negative for chills, fatigue and fever.  HENT: Negative for congestion, ear pain, sinus pain and sore throat.   Eyes: Negative.   Respiratory: Negative for cough, shortness of breath and wheezing.   Cardiovascular: Negative for chest pain, palpitations and leg swelling.  Gastrointestinal: Negative for abdominal pain, diarrhea, nausea and vomiting.  Genitourinary: Negative for dysuria, frequency and urgency.  Musculoskeletal: Negative for arthralgias and myalgias.  Skin: Negative for color change, pallor and rash.  Neurological: Negative for syncope, light-headedness and headaches.  Psychiatric/Behavioral: The patient is not nervous/anxious.       Objective:   Physical Exam Vitals signs and nursing note reviewed.  Constitutional:      General: She is not in acute distress.    Appearance: She is obese. She is not toxic-appearing.  Eyes:     General:  No scleral icterus.    Extraocular Movements: Extraocular movements intact.     Conjunctiva/sclera: Conjunctivae normal.     Pupils: Pupils are equal, round, and reactive to light.  Neck:     Musculoskeletal: Normal range of motion and neck supple. No neck rigidity.  Cardiovascular:     Rate and Rhythm: Normal rate and regular rhythm.  Pulmonary:     Effort: Pulmonary effort is normal. No respiratory distress.     Breath sounds: Normal breath sounds.  Musculoskeletal:     Right lower leg: No  edema.     Left lower leg: No edema.  Skin:    General: Skin is warm and dry.  Neurological:     General: No focal deficit present.     Mental Status: She is alert and oriented to person, place, and time.     Gait: Gait normal.  Psychiatric:        Mood and Affect: Mood normal.        Behavior: Behavior normal.    Today's Vitals   12/01/18 1138  BP: 118/64  Pulse: 75  Temp: 98.1 F (36.7 C)  TempSrc: Temporal  SpO2: 95%  Weight: 210 lb 12.8 oz (95.6 kg)   Body mass index is 37.34 kg/m.     Assessment & Plan:    Recurrent infection of skin - Plan: CBC with Differential/Platelet  Hyperlipidemia with target LDL less than 70 - Plan: TSH  Type 2 diabetes mellitus with complication, without long-term current use of insulin ([place]) - Plan: Microalbumin / creatinine urine ratio, Lipid panel, Comprehensive metabolic panel, Hemoglobin A1c  Need for prophylactic vaccination against measles, mumps, rubella and varicella - Plan: Measles/Mumps/Rubella Immunity  We will get labs as ordered by PCP, lab orders from September 2020 released and they will be drawn today.  Advised patient to continue checking blood sugar, given a new meter to use and compare this to her old meter.  She will continue current medicines at this time.  Any medication changes will be deferred to PCP once lab work is completed.  Also reviewed healthy diet, recommended lots of lean proteins, vegetables, whole grains and avoiding excess sugar/processed foods.  Patient will follow-up regularly with PCP.  She is aware she can call clinic anytime with questions or concerns.

## 2018-12-01 NOTE — Patient Instructions (Signed)
Blood Glucose Monitoring, Adult °Monitoring your blood sugar (glucose) is an important part of managing your diabetes (diabetes mellitus). Blood glucose monitoring involves checking your blood glucose as often as directed and keeping a record (log) of your results over time. °Checking your blood glucose regularly and keeping a blood glucose log can: °· Help you and your health care provider adjust your diabetes management plan as needed, including your medicines or insulin. °· Help you understand how food, exercise, illnesses, and medicines affect your blood glucose. °· Let you know what your blood glucose is at any time. You can quickly find out if you have low blood glucose (hypoglycemia) or high blood glucose (hyperglycemia). °Your health care provider will set individualized treatment goals for you. Your goals will be based on your age, other medical conditions you have, and how you respond to diabetes treatment. Generally, the goal of treatment is to maintain the following blood glucose levels: °· Before meals (preprandial): 80-130 mg/dL (4.4-7.2 mmol/L). °· After meals (postprandial): below 180 mg/dL (10 mmol/L). °· A1c level: less than 7%. °Supplies needed: °· Blood glucose meter. °· Test strips for your meter. Each meter has its own strips. You must use the strips that came with your meter. °· A needle to prick your finger (lancet). Do not use a lancet more than one time. °· A device that holds the lancet (lancing device). °· A journal or log book to write down your results. °How to check your blood glucose ° °1. Wash your hands with soap and water. °2. Prick the side of your finger (not the tip) with the lancet. Use a different finger each time. °3. Gently rub the finger until a small drop of blood appears. °4. Follow instructions that come with your meter for inserting the test strip, applying blood to the strip, and using your blood glucose meter. °5. Write down your result and any notes. °Some meters  allow you to use areas of your body other than your finger (alternative sites) to test your blood. The most common alternative sites are: °· Forearm. °· Thigh. °· Palm of the hand. °If you think you may have hypoglycemia, or if you have a history of not knowing when your blood glucose is getting low (hypoglycemia unawareness), do not use alternative sites. Use your finger instead. Alternative sites may not be as accurate as the fingers, because blood flow is slower in these areas. This means that the result you get may be delayed, and it may be different from the result that you would get from your finger. °Follow these instructions at home: °Blood glucose log ° °· Every time you check your blood glucose, write down your result. Also write down any notes about things that may be affecting your blood glucose, such as your diet and exercise for the day. This information can help you and your health care provider: °? Look for patterns in your blood glucose over time. °? Adjust your diabetes management plan as needed. °· Check if your meter allows you to download your records to a computer. Most glucose meters store a record of glucose readings in the meter. °If you have type 1 diabetes: °· Check your blood glucose 2 or more times a day. °· Also check your blood glucose: °? Before every insulin injection. °? Before and after exercise. °? Before meals. °? 2 hours after a meal. °? Occasionally between 2:00 a.m. and 3:00 a.m., as directed. °? Before potentially dangerous tasks, like driving or using heavy machinery. °?   At bedtime. °· You may need to check your blood glucose more often, up to 6-10 times a day, if you: °? Use an insulin pump. °? Need multiple daily injections (MDI). °? Have diabetes that is not well-controlled. °? Are ill. °? Have a history of severe hypoglycemia. °? Have hypoglycemia unawareness. °If you have type 2 diabetes: °· If you take insulin or other diabetes medicines, check your blood glucose 2 or  more times a day. °· If you are on intensive insulin therapy, check your blood glucose 4 or more times a day. Occasionally, you may also need to check between 2:00 a.m. and 3:00 a.m., as directed. °· Also check your blood glucose: °? Before and after exercise. °? Before potentially dangerous tasks, like driving or using heavy machinery. °· You may need to check your blood glucose more often if: °? Your medicine is being adjusted. °? Your diabetes is not well-controlled. °? You are ill. °General tips °· Always keep your supplies with you. °· If you have questions or need help, all blood glucose meters have a 24-hour "hotline" phone number that you can call. You may also contact your health care provider. °· After you use a few boxes of test strips, adjust (calibrate) your blood glucose meter by following instructions that came with your meter. °Contact a health care provider if: °· Your blood glucose is at or above 240 mg/dL (13.3 mmol/L) for 2 days in a row. °· You have been sick or have had a fever for 2 days or longer, and you are not getting better. °· You have any of the following problems for more than 6 hours: °? You cannot eat or drink. °? You have nausea or vomiting. °? You have diarrhea. °Get help right away if: °· Your blood glucose is lower than 54 mg/dL (3 mmol/L). °· You become confused or you have trouble thinking clearly. °· You have difficulty breathing. °· You have moderate or large ketone levels in your urine. °Summary °· Monitoring your blood sugar (glucose) is an important part of managing your diabetes (diabetes mellitus). °· Blood glucose monitoring involves checking your blood glucose as often as directed and keeping a record (log) of your results over time. °· Your health care provider will set individualized treatment goals for you. Your goals will be based on your age, other medical conditions you have, and how you respond to diabetes treatment. °· Every time you check your blood glucose,  write down your result. Also write down any notes about things that may be affecting your blood glucose, such as your diet and exercise for the day. °This information is not intended to replace advice given to you by your health care provider. Make sure you discuss any questions you have with your health care provider. °Document Released: 01/14/2003 Document Revised: 11/04/2017 Document Reviewed: 06/23/2015 °Elsevier Patient Education © 2020 [employer] ° °

## 2018-12-01 NOTE — Telephone Encounter (Signed)
Pt had appt and labs with Philis Nettle, FNP today.

## 2018-12-04 LAB — MEASLES/MUMPS/RUBELLA IMMUNITY
Mumps IgG: 127 AU/mL
Rubella: 3.99 index
Rubeola IgG: 200 AU/mL

## 2018-12-05 ENCOUNTER — Other Ambulatory Visit: Payer: Self-pay

## 2018-12-07 ENCOUNTER — Ambulatory Visit (INDEPENDENT_AMBULATORY_CARE_PROVIDER_SITE_OTHER): Payer: Medicare HMO | Admitting: Internal Medicine

## 2018-12-07 ENCOUNTER — Encounter: Payer: Self-pay | Admitting: Internal Medicine

## 2018-12-07 ENCOUNTER — Other Ambulatory Visit: Payer: Self-pay

## 2018-12-07 DIAGNOSIS — K581 Irritable bowel syndrome with constipation: Secondary | ICD-10-CM

## 2018-12-07 DIAGNOSIS — I10 Essential (primary) hypertension: Secondary | ICD-10-CM

## 2018-12-07 DIAGNOSIS — Z23 Encounter for immunization: Secondary | ICD-10-CM | POA: Diagnosis not present

## 2018-12-07 DIAGNOSIS — E118 Type 2 diabetes mellitus with unspecified complications: Secondary | ICD-10-CM | POA: Diagnosis not present

## 2018-12-07 DIAGNOSIS — R0789 Other chest pain: Secondary | ICD-10-CM | POA: Diagnosis not present

## 2018-12-07 MED ORDER — DICYCLOMINE HCL 20 MG PO TABS: 20.0000 mg | ORAL_TABLET | Freq: Four times a day (QID) | ORAL | 0 refills | Status: DC

## 2018-12-07 NOTE — Progress Notes (Signed)
Subjective:  Patient ID: Barbara Wade, female    DOB: [birth year]  Age: 58 y.o. MRN: [id number]  CC: Diagnoses of Need for immunization against influenza, Type 2 diabetes mellitus with complication, without long-term current use of insulin ([place]), Irritable bowel syndrome with constipation, Essential hypertension, and Xiphoidalgia syndrome were pertinent to this visit.  HPI Barbara Wade presents for FOLLOW UP ON MULTIPLE ISSUES.  LAST SEEN SEPT 2020    DIABETES ,  IBS,  AND EPIGASTRIC PAIN (CHRONIC, recurrent  /xiphoidalgia  FOLLOW UP  1) Last seen for diabetes follow up  In December [id number] and Trulicity was added.  FASTING 177 TO 183  And CBGS AFTER MEALS  230 ,  ONE OUTLIER OF 339 explained by meal that had 2 starches.  Has been taking  glipizide 5 mg bid before lunch and dinner. No significant weight loss recently, per chart she has lost  ten lbs .  Intolerant of metformin due to nail splitting  2) IBS : constipation predominant.  Was controllled for months on the  low fod map diet,  But for the past several  Months has been  Having 3 hard stools per week,  No bleeding ,  And severe post prandial pain that is relieved with hyoscyamine. EGD and colonoscopy reports from Jan 2020 reviewed . Biopsies for celiac disease were taken and negative.  biopsy of the GE junction noted Barrett's epithelium without dysplasia or malignancy   She has not been taking nexium or any other PPI  .  She Drinks 4 liters of water daily .  Eating canteloupe blueberries strawberries.  Not a lot of vegetables .  Tried a daily dose of metamucil with no change so she stopped taking it    3) epigastric pain : she reports a return of the pain at the end of her xiphoid process and feel that there is againn a lump in that area.  The pain has been worse In the last several months . No history of trauma.  Was present in 2017,  MRI abdomen was done at the time and was  normal   Outpatient Medications Prior to  Visit  Medication Sig Dispense Refill  . [facility] D3 1000 units capsule TAKE 2 CAPSULES BY MOUTH DAILY 120 capsule 1  . EPINEPHrine (EPIPEN) 0.3 mg/0.3 mL DEVI Inject 0.3 mLs (0.3 mg total) into the muscle once. 1 Device 6  . fluticasone (FLONASE) 50 MCG/ACT nasal spray Place 2 sprays into the nose daily as needed.     Marland Kitchen glipiZIDE (GLUCOTROL) 5 MG tablet Take 1 tablet (5 mg total) by mouth 2 (two) times daily before a meal. 180 tablet 0  . glucose blood (ONETOUCH VERIO) test strip Used to check blood sugars twice per day. 100 each 1  . hyoscyamine (ANASPAZ) 0.125 MG TBDP disintergrating tablet Place 1 tablet (0.125 mg total) under the tongue every 4 (four) hours as needed for cramping. 60 tablet 0  . Lactobacillus (PROBIOTIC ACIDOPHILUS PO) Take 1 capsule by mouth 2 (two) times daily.    . Lancets (ONETOUCH ULTRASOFT) lancets Use as instructed 100 each 2  . meclizine (ANTIVERT) 25 MG tablet Take 1 tablet (25 mg total) by mouth 2 (two) times daily. As needed for vertigo 30 tablet 4  . polyethylene glycol (MIRALAX / GLYCOLAX) packet Take 17 g by mouth daily.    Marland Kitchen sulfamethoxazole-trimethoprim (BACTRIM DS) 800-160 MG tablet Take 1 tablet by mouth 2 (two) times daily. 14 tablet 0  .  triamterene-hydrochlorothiazide (MAXZIDE-25) 37.5-25 MG tablet Take 1 tablet by mouth daily. 90 tablet 3  . TRULICITY 1.5 0000000 SOPN INJECT 1.5 MG SUBQ ONCE A WEEK 2 mL 2  . fexofenadine (ALLEGRA) 180 MG tablet Take 1 tablet (180 mg total) by mouth daily. (Patient taking differently: Take 180 mg by mouth daily as needed. ) 30 tablet 5   No facility-administered medications prior to visit.     Review of Systems;  Patient denies headache, fevers, malaise, unintentional weight loss, skin rash, eye pain, sinus congestion and sinus pain, sore throat, dysphagia,  hemoptysis , cough, dyspnea, wheezing, chest pain, palpitations, orthopnea, edema,, nausea, melena, diarrhea, constipation, flank pain, dysuria, hematuria, urinary   Frequency, nocturia, numbness, tingling, seizures,  Focal weakness, Loss of consciousness,  Tremor, insomnia, depression, anxiety, and suicidal ideation.      Objective:  BP 125/70   Pulse 77   Temp 98.2 F (36.8 C) (Temporal)   Ht 5\' 2"  (1.575 m)   Wt 210 lb 12.8 oz (95.6 kg)   SpO2 97%   BMI 38.56 kg/m   BP Readings from Last 3 Encounters:  12/07/18 125/70  12/01/18 118/64  02/20/18 123/74    Wt Readings from Last 3 Encounters:  12/07/18 210 lb 12.8 oz (95.6 kg)  12/01/18 210 lb 12.8 oz (95.6 kg)  10/23/18 215 lb (97.5 kg)    General appearance: alert, cooperative and appears stated age Ears: normal TM's and external ear canals both ears Throat: lips, mucosa, and tongue normal; teeth and gums normal Neck: no adenopathy, no carotid bruit, supple, symmetrical, trachea midline and thyroid not enlarged, symmetric, no tenderness/mass/nodules Back: symmetric, no curvature. ROM normal. No CVA tenderness. Lungs: clear to auscultation bilaterally Heart: regular rate and rhythm, S1, S2 normal, no murmur, click, rub or gallop Abdomen: soft, protuberant,  Tender xiphoid process without mass, bowel sounds normal; no masses,  no organomegaly Pulses: 2+ and symmetric Skin: Skin color, texture, turgor normal. No rashes or lesions Lymph nodes: Cervical, supraclavicular, and axillary nodes normal.  Lab Results  Component Value Date   HGBA1C 10.7 (H) 12/01/2018   HGBA1C 8.3 (A) 10/28/2017   HGBA1C 5.8 11/19/2016    Lab Results  Component Value Date   CREATININE 0.80 12/01/2018   CREATININE 0.8 10/28/2017   CREATININE 0.7 10/06/2016    Lab Results  Component Value Date   WBC 8.4 12/01/2018   HGB 14.9 12/01/2018   HCT 42.3 12/01/2018   PLT 269.0 12/01/2018   GLUCOSE 306 (H) 12/01/2018   CHOL 219 (H) 12/01/2018   TRIG 321.0 (H) 12/01/2018   HDL 39.80 12/01/2018   LDLDIRECT 122.0 12/01/2018   LDLCALC 142 10/28/2017   ALT 40 (H) 12/01/2018   AST 37 12/01/2018   NA 135  12/01/2018   K 4.0 12/01/2018   CL 97 12/01/2018   CREATININE 0.80 12/01/2018   BUN 15 12/01/2018   CO2 26 12/01/2018   TSH 1.96 12/01/2018   HGBA1C 10.7 (H) 12/01/2018   MICROALBUR <0.7 12/01/2018    No results found.  Assessment & Plan:   Problem List Items Addressed This Visit      Unprioritized   Type 2 diabetes mellitus with complication, without long-term current use of insulin ([facility])    Insulin resistance with worsening control secondary to lack of exercise.  Increase glipizide to 10 mg bid.  Continue trulicity.  Metformin not tolerated.   Lab Results  Component Value Date   HGBA1C 10.7 (H) 12/01/2018   Lab Results  Component  Value Date   MICROALBUR <0.7 12/01/2018   MICROALBUR 1.3 01/06/2018           Irritable bowel syndrome (IBS)    Temporary reduction in the  frequency of flares since following the [place] diet has ended and she is now having constipation and severe post prandial crarmping.  Discussed options of therapy including bentyl ,  linzess or amitiza       Relevant Medications   dicyclomine (BENTYL) 20 MG tablet   Xiphoidalgia syndrome    Present since 2017 with prior workup including an MRI of the abdomen given right hemidiaphragm elevation on plain films . She has fibromyalgia ,  This may be a manifestation.  ,will recommend trial of cymbalta if pain persists      Essential hypertension    Well controlled on maxzide,  No proteinuria,  Renal function stable, no changes today.  Lab Results  Component Value Date   CREATININE 0.80 12/01/2018   Lab Results  Component Value Date   NA 135 12/01/2018   K 4.0 12/01/2018   CL 97 12/01/2018   CO2 26 12/01/2018          Other Visit Diagnoses    Need for immunization against influenza       Relevant Orders   Flu Vaccine QUAD 36+ mos IM (Completed)      I am having Barbara Wade start on dicyclomine. I am also having her maintain her EPINEPHrine, fluticasone, fexofenadine, CVS D3,  Lactobacillus (PROBIOTIC ACIDOPHILUS PO), polyethylene glycol, meclizine, glipiZIDE, sulfamethoxazole-trimethoprim, Trulicity, triamterene-hydrochlorothiazide, hyoscyamine, OneTouch Verio, and onetouch ultrasoft.  Meds ordered this encounter  Medications  . dicyclomine (BENTYL) 20 MG tablet    Sig: Take 1 tablet (20 mg total) by mouth every 6 (six) hours.    Dispense:  90 tablet    Refill:  0    There are no discontinued medications.  Follow-up: Return in about 3 months (around 03/09/2019).   Crecencio Mc, MD

## 2018-12-07 NOTE — Patient Instructions (Addendum)
FOR YOUR DIABETES:  Increase glipizide to 10 mg twice daily before breakfast and dinner  Continue trulicity once weekly  Start biking for 15 minutes 3 times per week    Your goal is 30 minutes 5 days per week    FOR YOUR CONSTIPATION:  1) INCREASE YOUR FIBER INTAKE TO [place] makes a whole wheat tortilla   26 g fiber  nEt 6 carbs  2) mIX CITRUCEL WITH METAMUCIL AND TAKE DAILY    ANOTHER GOOD TASTING BREAD THAT'S LOW CAR: SOLA 3 NET CARB TASTES LIKE REAL BREAD FROZEN BREAD SECTION    there are 2 MEDICATIONS FOR CHRONIC CONSTIPATION/IBS:  AMITIZA  LIINZESS    Thi following medication is HELPFUL  FOR POST MEAL CRAMPING:  DICYCLOMINE (KNOWN AS BENTYL)    For the abdominal pain :  Nexium 20 mg daily (resume) for 4 weeks [place] patch with lidocaine   If no improvement, let me know

## 2018-12-09 MED ORDER — GLIPIZIDE 10 MG PO TABS: 10.0000 mg | ORAL_TABLET | Freq: Two times a day (BID) | ORAL | 1 refills | Status: DC

## 2018-12-09 NOTE — Assessment & Plan Note (Addendum)
Well controlled on maxzide,  No proteinuria,  Renal function stable, no changes today.  Lab Results  Component Value Date   CREATININE 0.80 12/01/2018   Lab Results  Component Value Date   NA 135 12/01/2018   K 4.0 12/01/2018   CL 97 12/01/2018   CO2 26 12/01/2018

## 2018-12-09 NOTE — Assessment & Plan Note (Addendum)
Present since 2017 with prior workup including an MRI of the abdomen given right hemidiaphragm elevation on plain films . She has fibromyalgia ,  This may be a manifestation.  ,will recommend trial of cymbalta if pain persists

## 2018-12-09 NOTE — Assessment & Plan Note (Signed)
Insulin resistance with worsening control secondary to lack of exercise.  Increase glipizide to 10 mg bid.  Continue trulicity.  Metformin not tolerated.   Lab Results  Component Value Date   HGBA1C 10.7 (H) 12/01/2018   Lab Results  Component Value Date   MICROALBUR <0.7 12/01/2018   MICROALBUR 1.3 01/06/2018

## 2018-12-09 NOTE — Assessment & Plan Note (Signed)
Temporary reduction in the  frequency of flares since following the [place] diet has ended and she is now having constipation and severe post prandial crarmping.  Discussed options of therapy including bentyl ,  linzess or amitiza

## 2018-12-12 DIAGNOSIS — R42 Dizziness and giddiness: Secondary | ICD-10-CM | POA: Diagnosis not present

## 2018-12-12 DIAGNOSIS — H6123 Impacted cerumen, bilateral: Secondary | ICD-10-CM | POA: Diagnosis not present

## 2018-12-12 DIAGNOSIS — H8103 Meniere's disease, bilateral: Secondary | ICD-10-CM | POA: Diagnosis not present

## 2018-12-12 DIAGNOSIS — H903 Sensorineural hearing loss, bilateral: Secondary | ICD-10-CM | POA: Diagnosis not present

## 2019-01-25 ENCOUNTER — Other Ambulatory Visit: Payer: Self-pay | Admitting: Internal Medicine

## 2019-01-30 ENCOUNTER — Ambulatory Visit: Payer: Medicare HMO | Attending: Internal Medicine

## 2019-01-30 DIAGNOSIS — Z20822 Contact with and (suspected) exposure to covid-19: Secondary | ICD-10-CM

## 2019-02-01 LAB — NOVEL CORONAVIRUS, NAA: SARS-CoV-2, NAA: NOT DETECTED

## 2019-02-17 ENCOUNTER — Other Ambulatory Visit: Payer: Self-pay | Admitting: Internal Medicine

## 2019-03-08 ENCOUNTER — Telehealth: Payer: Self-pay | Admitting: Internal Medicine

## 2019-03-08 NOTE — Telephone Encounter (Signed)
Left message for patient to call back and schedule Medicare Annual Wellness Visit (AWV) either virtually or audio only.  No hx of AWV; please schedule at anytime with Denisa O'Brien-Blaney at [facility]   

## 2019-03-14 ENCOUNTER — Ambulatory Visit: Payer: Medicare HMO | Admitting: Internal Medicine

## 2019-03-22 ENCOUNTER — Other Ambulatory Visit: Payer: Self-pay | Admitting: Internal Medicine

## 2019-04-27 ENCOUNTER — Telehealth: Payer: Self-pay | Admitting: Family Medicine

## 2019-04-27 NOTE — Telephone Encounter (Signed)
Call received from call service. Patient reports COVID19 symptoms with cough, congestion, fever, and mild diarrhea. Patients husband with similar symptoms and tested yesterday with no results yet. The RN noted the patients cough sounded significant and the outcome on their end was for her to go to the ED. She does not have shortness of breath or chest pain. I advised that given the lack of chest pain and shortness of breath that I did not think the patient needed to be seen in the ED, though urgent care evaluation would be reasonable given her significant cough so she can have a hands on exam and vitals checked and have COVID19 testing completed. The RN noted she would advise the patient.

## 2019-04-28 ENCOUNTER — Encounter: Payer: Self-pay | Admitting: Emergency Medicine

## 2019-04-28 ENCOUNTER — Other Ambulatory Visit: Payer: Self-pay

## 2019-04-28 ENCOUNTER — Ambulatory Visit
Admission: EM | Admit: 2019-04-28 | Discharge: 2019-04-28 | Disposition: A | Discharge status: Home / Self Care | Payer: Medicare HMO | Attending: Emergency Medicine | Admitting: Emergency Medicine

## 2019-04-28 DIAGNOSIS — R5383 Other fatigue: Secondary | ICD-10-CM | POA: Diagnosis not present

## 2019-04-28 DIAGNOSIS — R519 Headache, unspecified: Secondary | ICD-10-CM | POA: Diagnosis not present

## 2019-04-28 DIAGNOSIS — R05 Cough: Secondary | ICD-10-CM | POA: Diagnosis not present

## 2019-04-28 DIAGNOSIS — M791 Myalgia, unspecified site: Secondary | ICD-10-CM

## 2019-04-28 DIAGNOSIS — J029 Acute pharyngitis, unspecified: Secondary | ICD-10-CM | POA: Diagnosis not present

## 2019-04-28 DIAGNOSIS — R197 Diarrhea, unspecified: Secondary | ICD-10-CM

## 2019-04-28 DIAGNOSIS — Z87891 Personal history of nicotine dependence: Secondary | ICD-10-CM

## 2019-04-28 DIAGNOSIS — Z20822 Contact with and (suspected) exposure to covid-19: Secondary | ICD-10-CM

## 2019-04-28 DIAGNOSIS — Z8616 Personal history of COVID-19: Secondary | ICD-10-CM

## 2019-04-28 DIAGNOSIS — U071 COVID-19: Secondary | ICD-10-CM | POA: Insufficient documentation

## 2019-04-28 HISTORY — DX: Personal history of COVID-19: Z86.16

## 2019-04-28 MED ORDER — IBUPROFEN 600 MG PO TABS: 600.0000 mg | ORAL_TABLET | Freq: Four times a day (QID) | ORAL | 0 refills | Status: DC | PRN

## 2019-04-28 MED ORDER — BENZONATATE 200 MG PO CAPS: 200.0000 mg | ORAL_CAPSULE | Freq: Three times a day (TID) | ORAL | 0 refills | Status: DC | PRN

## 2019-04-28 NOTE — ED Triage Notes (Signed)
Patient in today c/o cough, body aches, fatigue and diarrhea x 2 days. Patient's husband tested positive for covid. He received his results this morning.

## 2019-04-28 NOTE — Discharge Instructions (Addendum)
Push plenty of extra fluids.  Take 1000 mg of Tylenol with the ibuprofen 3-4 times a day as needed for body aches, headaches, fevers.  Tessalon for the cough.  Buy a pulse oximeter and monitor your pulse ox.  Go to the ER if it is consistently below 92% and you are having difficulty breathing.  You can try vitamin C 500 mg twice a day, vitamin D3 5000 international units once daily, 50 to 75 mg of zinc once a day.  Listerene may help decrease the spread of Covid.   I will contact you if your Covid comes back positive.  We did a send out test which is more accurate than the rapid test.  It will take 6 to 24 hours for the test to come back.  You will be a candidate for the infusion clinic.

## 2019-04-28 NOTE — ED Provider Notes (Addendum)
HPI  SUBJECTIVE:  Barbara Wade is a 59 y.o. female who presents with 3 days of body aches, headaches, sore throat and nonproductive cough, fatigue, diarrhea.  She denies fevers nasal congestion rhinorrhea loss of sense of smell or taste shortness of breath nausea vomiting abdominal pain.  No antibiotics in the past month.  No antipyretic in the past 4 to 6 hours.  States that she is mostly able to sleep through the night without waking up coughing.  She got a flu shot but has not yet gotten a Covid vaccination.  Her husband is also sick, and his Covid test came back positive today.  She has tried [employer], Tylenol and sleeping with improvement in her symptoms.  No aggravating factors.  She has a past medical history of diabetes and is a former smoker.  No history of coronary artery disease, MI, pulmonary disease hypertension cancer immunocompromise HIV chronic kidney disease GI bleed peptic ulcer disease.  [id number], Aris Everts, MD   Past Medical History:  Diagnosis Date  . Allergy   . Anaphylaxis 2007   occurred during allergy testing to environmental allergen  . Chronic headaches   . Colon polyps   . Depression    managed currently with [facility]  . Diabetes mellitus without complication ([place])   . Diverticulosis   . Fibromyalgia 2004  . Gallstones   . Hepatitis B virus infection   . Irritable bowel syndrome (IBS)   . Meniere's disease   . Neuromuscular disorder ([place])   . Sleep apnea     Past Surgical History:  Procedure Laterality Date  . ABDOMINAL HYSTERECTOMY  2000   for fibroids  . CHOLECYSTECTOMY     elective  . COLONOSCOPY WITH PROPOFOL N/A 02/20/2018   Procedure: COLONOSCOPY WITH PROPOFOL;  Surgeon: Virgel Manifold, MD;  Location: [facility] ENDOSCOPY;  Service: Endoscopy;  Laterality: N/A;  . ESOPHAGOGASTRODUODENOSCOPY (EGD) WITH PROPOFOL N/A 02/20/2018   Procedure: ESOPHAGOGASTRODUODENOSCOPY (EGD) WITH PROPOFOL;  Surgeon: Virgel Manifold, MD;   Location: [facility] ENDOSCOPY;  Service: Endoscopy;  Laterality: N/A;  . KNEE ARTHROSCOPY Left 2004  . PILONIDAL CYST EXCISION  1983  . TRIGGER FINGER RELEASE Bilateral     Family History  Problem Relation Age of Onset  . Heart disease Maternal Aunt   . Heart disease Maternal Uncle   . Heart disease Maternal Grandmother   . Stroke Maternal Grandmother   . Healthy Mother   . Diabetes Father   . Colon polyps Father   . Colon cancer Paternal Grandmother   . Colon polyps Paternal Uncle     Social History   Tobacco Use  . Smoking status: Former Smoker    Packs/day: 1.00    Years: 20.00    Pack years: 20.00    Types: Cigarettes    Quit date: 10/16/2002    Years since quitting: 16.5  . Smokeless tobacco: Never Used  Substance Use Topics  . Alcohol use: Yes    Alcohol/week: 0.0 - 1.0 standard drinks    Comment: occasional  . Drug use: No    No current facility-administered medications for this encounter.  Current Outpatient Medications:  .  [employer] D3 1000 units capsule, TAKE 2 CAPSULES BY MOUTH DAILY, Disp: 120 capsule, Rfl: 1 .  dicyclomine (BENTYL) 20 MG tablet, TAKE ONE TABLET BY MOUTH EVERY 6 HOURS, Disp: 90 tablet, Rfl: 0 .  EPINEPHrine (EPIPEN) 0.3 mg/0.3 mL DEVI, Inject 0.3 mLs (0.3 mg total) into the muscle once., Disp: 1  Device, Rfl: 6 .  fexofenadine (ALLEGRA) 180 MG tablet, Take 1 tablet (180 mg total) by mouth daily. (Patient taking differently: Take 180 mg by mouth daily as needed. ), Disp: 30 tablet, Rfl: 5 .  fluticasone (FLONASE) 50 MCG/ACT nasal spray, Place 2 sprays into the nose daily as needed. , Disp: , Rfl:  .  glipiZIDE (GLUCOTROL) 10 MG tablet, Take 1 tablet (10 mg total) by mouth 2 (two) times daily before a meal., Disp: 180 tablet, Rfl: 1 .  glucose blood (ONETOUCH VERIO) test strip, Used to check blood sugars twice per day., Disp: 100 each, Rfl: 1 .  hyoscyamine (ANASPAZ) 0.125 MG TBDP disintergrating tablet, Place 1 tablet (0.125 mg total) under the tongue  every 4 (four) hours as needed for cramping., Disp: 60 tablet, Rfl: 0 .  Lactobacillus (PROBIOTIC ACIDOPHILUS PO), Take 1 capsule by mouth 2 (two) times daily., Disp: , Rfl:  .  Lancets (ONETOUCH ULTRASOFT) lancets, Use as instructed, Disp: 100 each, Rfl: 2 .  meclizine (ANTIVERT) 25 MG tablet, Take 1 tablet (25 mg total) by mouth 2 (two) times daily. As needed for vertigo, Disp: 30 tablet, Rfl: 4 .  polyethylene glycol (MIRALAX / GLYCOLAX) packet, Take 17 g by mouth daily., Disp: , Rfl:  .  triamterene-hydrochlorothiazide (MAXZIDE-25) 37.5-25 MG tablet, Take 1 tablet by mouth daily., Disp: 90 tablet, Rfl: 3 .  TRULICITY 1.5 0000000 SOPN, INJECT 1.5 MG SUBQ ONCE A WEEK, Disp: 2 mL, Rfl: 2 .  benzonatate (TESSALON) 200 MG capsule, Take 1 capsule (200 mg total) by mouth 3 (three) times daily as needed for cough., Disp: 30 capsule, Rfl: 0 .  ibuprofen (ADVIL) 600 MG tablet, Take 1 tablet (600 mg total) by mouth every 6 (six) hours as needed., Disp: 30 tablet, Rfl: 0  Allergies  Allergen Reactions  . Hydrocodone Hives  . Plum Pulp Anaphylaxis  . Prunus Persica Anaphylaxis     ROS  As noted in HPI.   Physical Exam  BP 135/80 (BP Location: Left Arm)   Pulse 85   Temp 99.1 F (37.3 C) (Oral)   Resp 18   Ht 5\' 3"  (1.6 m)   Wt 93 kg   SpO2 98%   BMI 36.31 kg/m   Constitutional: Well developed, well nourished, no acute distress Eyes:  EOMI, conjunctiva normal bilaterally HENT: Normocephalic, atraumatic,mucus membranes moist normal oropharynx, normal tonsils, uvula midline.  No obvious postnasal drip. Neck: No cervical lymphadenopathy Respiratory: Normal inspiratory effort, lungs clear bilaterally Cardiovascular: Normal rate regular rhythm no murmurs rubs or gallops GI: nondistended skin: No rash, skin intact Musculoskeletal: no deformities Neurologic: Alert & oriented x 3, no focal neuro deficits Psychiatric: Speech and behavior appropriate   ED Course   Medications - No  data to display  Orders Placed This Encounter  Procedures  . SARS CORONAVIRUS 2 (TAT 6-24 HRS) Nasopharyngeal Nasopharyngeal Swab    Standing Status:   Standing    Number of Occurrences:   1    Order Specific Question:   Is this test for diagnosis or screening    Answer:   Diagnosis of ill patient    Order Specific Question:   Symptomatic for COVID-19 as defined by CDC    Answer:   Yes    Order Specific Question:   Date of Symptom Onset    Answer:   04/26/2019    Order Specific Question:   Hospitalized for COVID-19    Answer:   No    Order Specific Question:  Admitted to ICU for COVID-19    Answer:   No    Order Specific Question:   Previously tested for COVID-19    Answer:   Yes    Order Specific Question:   Resident in a congregate (group) care setting    Answer:   No    Order Specific Question:   Employed in healthcare setting    Answer:   No    Order Specific Question:   Pregnant    Answer:   No    No results found for this or any previous visit (from the past 24 hour(s)). No results found.  ED Clinical Impression  1. Suspected COVID-19 virus infection   2. Close exposure to COVID-19 virus      ED Assessment/Plan  Patient will be a candidate for infusion therapy because of BMI above 35 and diabetes.  Covid PCR processing.  We will contact her through [facility] or at [phone] if it is positive and will set up an infusion therapy appointment.  Home with Tessalon, Tylenol/ibuprofen, zinc, vitamin D, vitamin C.  Advised her to buy a pulse ox.  Advised quarantine for the next 10 days.  Discussed labs MDM, treatment plan, and plan for follow-up with patient. Discussed sn/sx that should prompt return to the ED. patient agrees with plan.   04/29/2019 1340-Covid test positive.  Notified patient through MyChart and also left voicemail asking her to call the clinic for her test result.  Called the outpatient infusion clinic.  Meds ordered this encounter  Medications  . ibuprofen  (ADVIL) 600 MG tablet    Sig: Take 1 tablet (600 mg total) by mouth every 6 (six) hours as needed.    Dispense:  30 tablet    Refill:  0  . benzonatate (TESSALON) 200 MG capsule    Sig: Take 1 capsule (200 mg total) by mouth 3 (three) times daily as needed for cough.    Dispense:  30 capsule    Refill:  0    *This clinic note was created using [employer]. Therefore, there may be occasional mistakes despite careful proofreading.   ?    Melynda Ripple, MD 04/28/19 1336    Melynda Ripple, MD 04/29/19 1341

## 2019-04-29 ENCOUNTER — Telehealth: Payer: Self-pay | Admitting: Emergency Medicine

## 2019-04-29 ENCOUNTER — Other Ambulatory Visit: Payer: Self-pay | Admitting: Physician Assistant

## 2019-04-29 ENCOUNTER — Encounter: Payer: Self-pay | Admitting: Emergency Medicine

## 2019-04-29 DIAGNOSIS — U071 COVID-19: Secondary | ICD-10-CM

## 2019-04-29 DIAGNOSIS — E118 Type 2 diabetes mellitus with unspecified complications: Secondary | ICD-10-CM

## 2019-04-29 DIAGNOSIS — I10 Essential (primary) hypertension: Secondary | ICD-10-CM

## 2019-04-29 LAB — SARS CORONAVIRUS 2 (TAT 6-24 HRS): SARS Coronavirus 2: POSITIVE — AB

## 2019-04-29 NOTE — Progress Notes (Signed)
  I connected by phone with [name] on 04/29/2019 at 7:42 PM to discuss the potential use of an new treatment for mild to moderate COVID-19 viral infection in non-hospitalized patients.  This patient is a 59 y.o. female that meets the FDA criteria for Emergency Use Authorization of bamlanivimab or casirivimab\imdevimab.  Has a (+) direct SARS-CoV-2 viral test result  Has mild or moderate COVID-19   Is ? 61 years of age and weighs ? 40 kg  Is NOT hospitalized due to COVID-19  Is NOT requiring oxygen therapy or requiring an increase in baseline oxygen flow rate due to COVID-19  Is within 10 days of symptom onset  Has at least one of the high risk factor(s) for progression to severe COVID-19 and/or hospitalization as defined in EUA.  Specific high risk criteria : BMI >/= 35, HTN, DMT2   I have spoken and communicated the following to the patient or parent/caregiver:  1. FDA has authorized the emergency use of bamlanivimab and casirivimab\imdevimab for the treatment of mild to moderate COVID-19 in adults and pediatric patients with positive results of direct SARS-CoV-2 viral testing who are 58 years of age and older weighing at least 40 kg, and who are at high risk for progressing to severe COVID-19 and/or hospitalization.  2. The significant known and potential risks and benefits of bamlanivimab and casirivimab\imdevimab, and the extent to which such potential risks and benefits are unknown.  3. Information on available alternative treatments and the risks and benefits of those alternatives, including clinical trials.  4. Patients treated with bamlanivimab and casirivimab\imdevimab should continue to self-isolate and use infection control measures (e.g., wear mask, isolate, social distance, avoid sharing personal items, clean and disinfect "high touch" surfaces, and frequent handwashing) according to CDC guidelines.   5. The patient or parent/caregiver has the option to  accept or refuse bamlanivimab or casirivimab\imdevimab .  After reviewing this information with the patient, The patient agreed to proceed with receiving the bamlanimivab infusion and will be provided a copy of the Fact sheet prior to receiving the infusion.   Sx onset 4/1. Infusion set up for tomorrow 04/30/19 @ 12:30. Husband also set up .    [name] 04/29/2019 7:42 PM

## 2019-04-29 NOTE — Telephone Encounter (Signed)
This note was created in error.  Patient's Covid test came back positive, notify patient through MyChart.  Also contacted the infusion clinic she is a candidate for infusion  AM.

## 2019-04-30 ENCOUNTER — Ambulatory Visit (HOSPITAL_COMMUNITY)
Admission: RE | Admit: 2019-04-30 | Discharge: 2019-04-30 | Disposition: A | Discharge status: Home / Self Care | Payer: Medicare Other | Source: Ambulatory Visit | Attending: Pulmonary Disease | Admitting: Pulmonary Disease

## 2019-04-30 DIAGNOSIS — U071 COVID-19: Secondary | ICD-10-CM | POA: Diagnosis present

## 2019-04-30 DIAGNOSIS — E118 Type 2 diabetes mellitus with unspecified complications: Secondary | ICD-10-CM

## 2019-04-30 DIAGNOSIS — Z23 Encounter for immunization: Secondary | ICD-10-CM | POA: Insufficient documentation

## 2019-04-30 DIAGNOSIS — I10 Essential (primary) hypertension: Secondary | ICD-10-CM | POA: Diagnosis present

## 2019-04-30 MED ORDER — FAMOTIDINE IN NACL 20-0.9 MG/50ML-% IV SOLN: 20.0000 mg | Freq: Once | INTRAVENOUS | Status: DC | PRN

## 2019-04-30 MED ORDER — DIPHENHYDRAMINE HCL 50 MG/ML IJ SOLN: 50.0000 mg | Freq: Once | INTRAMUSCULAR | Status: DC | PRN

## 2019-04-30 MED ORDER — ALBUTEROL SULFATE HFA 108 (90 BASE) MCG/ACT IN AERS: 2.0000 | INHALATION_SPRAY | Freq: Once | RESPIRATORY_TRACT | Status: DC | PRN

## 2019-04-30 MED ORDER — EPINEPHRINE 0.3 MG/0.3ML IJ SOAJ: 0.3000 mg | Freq: Once | INTRAMUSCULAR | Status: DC | PRN

## 2019-04-30 MED ORDER — SODIUM CHLORIDE 0.9 % IV SOLN: INTRAVENOUS | Status: DC | PRN

## 2019-04-30 MED ORDER — SODIUM CHLORIDE 0.9 % IV SOLN
700.0000 mg | Freq: Once | INTRAVENOUS | Status: AC
  Administered 2019-04-30: 13:00:00 700 mg via INTRAVENOUS
  Filled 2019-04-30: qty 700

## 2019-04-30 MED ORDER — METHYLPREDNISOLONE SODIUM SUCC 125 MG IJ SOLR: 125.0000 mg | Freq: Once | INTRAMUSCULAR | Status: DC | PRN

## 2019-04-30 NOTE — Telephone Encounter (Signed)
[place] Night - Cl TELEPHONE ADVICE RECORD [facility] Patient Name: [facility] HASSEL D AVIES Gender: Female DOB: 11-22-1960 Age: 59 Y 10 D Return Phone Number: [phone] (Primary) Address: City/State/Zip: [place] [address] Client [place] Night - Cl Client Site [place] Physician Deborra Medina - MD Contact Type Call Who Is Calling Patient / Member / Family / Caregiver Call Type Triage / Clinical Relationship To Patient Self Return Phone Number [phone] (Primary) Chief Complaint Cough Reason for Call Symptomatic / Request for [place] is having coughing, chills, low grade fever, joints hurt. Translation No Nurse Assessment Nurse: Hardin Negus, RN, Mardene Celeste Date/Time Eilene Ghazi Time): 04/27/2019 3:34:07 PM Confirm and document reason for call. If symptomatic, describe symptoms. ---Caller is having coughing, chills, low grade fever, diarrhea and joints hurt. T99.7 (O). Started last night. She is drinking fluids well. Has the patient had close contact with a person known or suspected to have the novel coronavirus illness OR traveled / lives in area with major community spread (including international travel) in the last 14 days from the onset of symptoms? * If Asymptomatic, screen for exposure and travel within the last 14 days. ---Yes Does the patient have any new or worsening symptoms? ---Yes Will a triage be completed? ---Yes Related visit to physician within the last 2 weeks? ---No Does the PT have any chronic conditions? (i.e. diabetes, asthma, this includes High risk factors for pregnancy, etc.) ---Yes List chronic conditions. ---diabetes, meniers Is this a behavioral health or substance abuse call? ---No Guidelines Guideline Title Affirmed Question Affirmed Notes Nurse Date/Time (Eastern Time) COVID-19 - Diagnosed or Suspected [1]  HIGH RISK patient (e.g., age > 21 years, diabetes, heart or lung disease, weak immune system) AND [2] new or worsening symptoms Oren Bracket 04/27/2019 3:36:16 PMPLEASE NOTE: All timestamps contained within this report are represented as [place] Standard Time. CONFIDENTIALTY NOTICE: This fax transmission is intended only for the addressee. It contains information that is legally privileged, confidential or otherwise protected from use or disclosure. If you are not the intended recipient, you are strictly prohibited from reviewing, disclosing, copying using or disseminating any of this information or taking any action in reliance on or regarding this information. If you have received this fax in error, please notify us immediately by telephone so that we can arrange for its return to [place]. Phone: [phone], Toll-Free: [phone], Fax: [phone] Page: 2 of 2 Call Id: [id number] [place]. Time Eilene Ghazi Time) Disposition Final User 04/27/2019 3:45:26 PM Called On-Call Provider Hardin Negus, RN, Mardene Celeste 04/27/2019 3:48:21 PM Call PCP Now Yes Hardin Negus, RN, Lenox Ponds Disagree/Comply Comply Caller Understands Yes PreDisposition Call Doctor Care Advice Given Per Guideline CALL PCP NOW: CALL BACK IF: * You become worse. Referrals GO TO FACILITY UNDECIDED Paging DoctorName Phone DateTime Result/Outcome Message Type Notes Tommi Rumps - MD [id number] 04/27/2019 3:45:26 PM Called On Call Provider - Reached Doctor Paged Tommi Rumps - MD 04/27/2019 3:46:32 PM Spoke with On Call - General Message Result Gave the on call MD report on the pt. He suggests she be seen in the UC so a provider can see her. I will call the pt and let her know

## 2019-04-30 NOTE — Discharge Instructions (Signed)

## 2019-04-30 NOTE — Progress Notes (Signed)
  Diagnosis: COVID-19  Physician: Dr. Joya Gaskins  Procedure: Covid Infusion Clinic Med: bamlanivimab infusion - Provided patient with bamlanimivab fact sheet for patients, parents and caregivers prior to infusion.  Complications: No immediate complications noted.  Discharge: Discharged home   Acquanetta Chain 04/30/2019

## 2019-04-30 NOTE — Telephone Encounter (Signed)
Patient was seen  At ER on 04/28/19 and DX with COVID has infusion therapy scheduled for Today. FYI

## 2019-05-10 ENCOUNTER — Ambulatory Visit: Payer: Medicare HMO

## 2019-05-10 ENCOUNTER — Other Ambulatory Visit: Payer: Self-pay

## 2019-05-10 ENCOUNTER — Ambulatory Visit
Admission: EM | Admit: 2019-05-10 | Discharge: 2019-05-10 | Disposition: A | Discharge status: Home / Self Care | Payer: Medicare HMO | Attending: Urgent Care | Admitting: Urgent Care

## 2019-05-10 DIAGNOSIS — J189 Pneumonia, unspecified organism: Secondary | ICD-10-CM | POA: Diagnosis not present

## 2019-05-10 DIAGNOSIS — R69 Illness, unspecified: Secondary | ICD-10-CM | POA: Diagnosis not present

## 2019-05-10 DIAGNOSIS — R0602 Shortness of breath: Secondary | ICD-10-CM | POA: Diagnosis not present

## 2019-05-10 DIAGNOSIS — R05 Cough: Secondary | ICD-10-CM | POA: Diagnosis not present

## 2019-05-10 MED ORDER — AMOXICILLIN-POT CLAVULANATE 875-125 MG PO TABS: 1.0000 | ORAL_TABLET | Freq: Two times a day (BID) | ORAL | 0 refills | Status: AC

## 2019-05-10 MED ORDER — ALBUTEROL SULFATE HFA 108 (90 BASE) MCG/ACT IN AERS: 1.0000 | INHALATION_SPRAY | Freq: Four times a day (QID) | RESPIRATORY_TRACT | 0 refills | Status: AC | PRN

## 2019-05-10 MED ORDER — AZITHROMYCIN 250 MG PO TABS: 250.0000 mg | ORAL_TABLET | Freq: Every day | ORAL | 0 refills | Status: DC

## 2019-05-10 NOTE — ED Triage Notes (Signed)
Pt presents with c/o recent COVID positive (04/28/19). Pt states she has cough, sometimes productive with clear mucus, and fatigue. Pt denies fever but has had some chills. Pt denies n/v/d or other symptoms. Pt reports COVID symptoms since 04/27/19.

## 2019-05-10 NOTE — Discharge Instructions (Addendum)
It was very nice seeing you today in clinic. Thank you for entrusting me with your care.   Rest and Stay HYDRATED. Water and electrolyte containing beverages (Gatorade, Pedialyte) are best to prevent dehydration and electrolyte abnormalities. Use antibiotics and breathing medications as prescribed. Monitor your oxygen.   Make arrangements to follow up with your regular doctor in 1 week for re-evaluation if not improving. If your symptoms/condition worsens, please seek follow up care either here or in the ER. Please remember, our [place] [facility] providers are "right here with you" when you need [place].   Again, it was my pleasure to take care of you today. Thank you for choosing our clinic. I hope that you start to feel better quickly.   Honor Loh, MSN, APRN, FNP-C, CEN Advanced Practice Provider [facility]

## 2019-05-11 ENCOUNTER — Other Ambulatory Visit: Payer: Self-pay | Admitting: Internal Medicine

## 2019-05-11 MED ORDER — PREDNISONE 10 MG PO TABS: ORAL_TABLET | ORAL | 0 refills | Status: DC

## 2019-05-12 ENCOUNTER — Encounter: Payer: Self-pay | Admitting: Urgent Care

## 2019-05-12 NOTE — ED Provider Notes (Signed)
Barbara, Wade   Name: Barbara Wade DOB: November 13, 1960 MRN: [id number] CSN: [id number] PCP: Crecencio Mc, MD  Arrival date and time:  05/10/19 1317  Chief Complaint:  Shortness of Breath and Cough  NOTE: Prior to seeing the patient today, I have reviewed the triage nursing documentation and vital signs. Clinical staff has updated patient's PMH/PSHx, current medication list, and drug allergies/intolerances to ensure comprehensive history available to assist in medical decision making.   History:   HPI: Barbara Wade is a 59 y.o. female who presents today with complaints of fatigue, cough, and shortness of breath. Patient reports that symptoms have been progressively worsening since she was diagnosed with SARS-CoV-2 (novel coronavirus) on 04/28/2019. Cough has been more productive over the course of the last 2 days. She notes that her chest feels tight and heavy at times. Patient has not experienced any fevers, however notes intermittent chills. She denies nausea, vomiting, bowel changes, and abdominal pain. She is eating and drinking well. Patient currently remains out of work due to her symptoms. She is making efforts to remain active at home, however notes that her fatigue is significant. Despite her symptoms, patient has not taken any over the counter interventions to help improve/relieve her reported symptoms at home. She is taking benzonatate for her cough.   Past Medical History:  Diagnosis Date  . Allergy   . Anaphylaxis 2007   occurred during allergy testing to environmental allergen  . Chronic headaches   . Colon polyps   . Depression    managed currently with [facility]  . Diabetes mellitus without complication ([place])   . Diverticulosis   . Fibromyalgia 2004  . Gallstones   . Hepatitis B virus infection   . History of 2019 novel coronavirus disease (COVID-19) 04/28/2019  . Irritable bowel syndrome (IBS)   . Meniere's disease   . Neuromuscular disorder  ([place])   . Sleep apnea     Past Surgical History:  Procedure Laterality Date  . ABDOMINAL HYSTERECTOMY  2000   for fibroids  . CHOLECYSTECTOMY     elective  . COLONOSCOPY WITH PROPOFOL N/A 02/20/2018   Procedure: COLONOSCOPY WITH PROPOFOL;  Surgeon: Virgel Manifold, MD;  Location: [facility] ENDOSCOPY;  Service: Endoscopy;  Laterality: N/A;  . ESOPHAGOGASTRODUODENOSCOPY (EGD) WITH PROPOFOL N/A 02/20/2018   Procedure: ESOPHAGOGASTRODUODENOSCOPY (EGD) WITH PROPOFOL;  Surgeon: Virgel Manifold, MD;  Location: [facility] ENDOSCOPY;  Service: Endoscopy;  Laterality: N/A;  . KNEE ARTHROSCOPY Left 2004  . PILONIDAL CYST EXCISION  1983  . TRIGGER FINGER RELEASE Bilateral     Family History  Problem Relation Age of Onset  . Heart disease Maternal Aunt   . Heart disease Maternal Uncle   . Heart disease Maternal Grandmother   . Stroke Maternal Grandmother   . Healthy Mother   . Diabetes Father   . Colon polyps Father   . Colon cancer Paternal Grandmother   . Colon polyps Paternal Uncle     Social History   Tobacco Use  . Smoking status: Former Smoker    Packs/day: 1.00    Years: 20.00    Pack years: 20.00    Types: Cigarettes    Quit date: 10/16/2002    Years since quitting: 16.5  . Smokeless tobacco: Never Used  Substance Use Topics  . Alcohol use: Yes    Alcohol/week: 0.0 - 1.0 standard drinks    Comment: occasional  . Drug use: No    Patient Active Problem List  Diagnosis Date Noted  . Hidradenitis suppurativa 10/24/2018  . CLE (columnar lined esophagus)   . Gastritis without bleeding   . Diarrhea   . Special screening for malignant neoplasms, colon   . Benign neoplasm of descending colon   . Diverticulosis of large intestine without diverticulitis   . Essential hypertension 11/22/2017  . S/P abdominal hysterectomy 07/04/2016  . Xiphoidalgia syndrome 09/23/2015  . Esophageal reflux 11/12/2013  . Sliding hiatal hernia 11/12/2013  . Trigger finger, acquired 09/19/2013   . Postprandial epigastric pain 11/01/2012  . Rosacea 05/02/2012  . Irritable bowel syndrome (IBS)   . Morbid obesity ([place]) 01/31/2012  . Vitamin D deficiency 01/05/2012  . Hand pain, right 12/15/2011  . Pain of left heel 12/15/2011  . Lymphedema of leg 07/17/2011  . Rhinitis, allergic 07/17/2011  . Type 2 diabetes mellitus with complication, without long-term current use of insulin ([place]) 12/02/2010  . Hyperlipidemia with target LDL less than 70 12/02/2010  . Hepatitis B virus infection   . History of colon polyps 10/16/2010  . Encounter for preventive health examination 10/16/2010  . History of food anaphylaxis   . Fibromyalgia     Home Medications:    Current Meds  Medication Sig  . [employer]S D3 1000 units capsule TAKE 2 CAPSULES BY MOUTH DAILY  . dicyclomine (BENTYL) 20 MG tablet TAKE ONE TABLET BY MOUTH EVERY 6 HOURS  . EPINEPHrine (EPIPEN) 0.3 mg/0.3 mL DEVI Inject 0.3 mLs (0.3 mg total) into the muscle once.  . fluticasone (FLONASE) 50 MCG/ACT nasal spray Place 2 sprays into the nose daily as needed.   Marland Kitchen glipiZIDE (GLUCOTROL) 10 MG tablet Take 1 tablet (10 mg total) by mouth 2 (two) times daily before a meal.  . glucose blood (ONETOUCH VERIO) test strip Used to check blood sugars twice per day.  . hyoscyamine (ANASPAZ) 0.125 MG TBDP disintergrating tablet Place 1 tablet (0.125 mg total) under the tongue every 4 (four) hours as needed for cramping.  . Lactobacillus (PROBIOTIC ACIDOPHILUS PO) Take 1 capsule by mouth 2 (two) times daily.  . Lancets (ONETOUCH ULTRASOFT) lancets Use as instructed  . meclizine (ANTIVERT) 25 MG tablet Take 1 tablet (25 mg total) by mouth 2 (two) times daily. As needed for vertigo  . polyethylene glycol (MIRALAX / GLYCOLAX) packet Take 17 g by mouth daily.  Marland Kitchen triamterene-hydrochlorothiazide (MAXZIDE-25) 37.5-25 MG tablet Take 1 tablet by mouth daily.  . TRULICITY 1.5 0000000 SOPN INJECT 1.5 MG SUBQ ONCE A WEEK    Allergies:   Hydrocodone, Plum pulp,  and Prunus persica  Review of Systems (ROS):  Review of systems NEGATIVE unless otherwise noted in narrative H&P section.   Vital Signs: Today's Vitals   05/10/19 1337 05/10/19 1339 05/10/19 1502  BP:  140/74   Pulse:  69   Resp:  18   Temp:  98.2 F (36.8 C)   TempSrc:  Oral   SpO2:  98%   Weight: 201 lb (91.2 kg)    Height: 5\' 3"  (1.6 m)    PainSc: 0-No pain  0-No pain    Physical Exam: Physical Exam  Constitutional: She is oriented to person, place, and time and well-developed, well-nourished, and in no distress.  Acutely ill appearing; fatigued.  HENT:  Head: Normocephalic and atraumatic.  Eyes: Pupils are equal, round, and reactive to light.  Cardiovascular: Normal rate, regular rhythm, normal heart sounds and intact distal pulses.  Pulmonary/Chest: Effort normal. She has decreased breath sounds in the right middle field and the  right lower field. She has wheezes (faint expiratory; scattered). She has rhonchi in the right middle field and the right lower field.  Moderate cough noted in clinic. No SOB or increased WOB. No distress. Able to speak in complete sentences without difficulties. SPO2 98% on RA.  Abdominal: Soft. Bowel sounds are normal. She exhibits no distension. There is no abdominal tenderness.  Neurological: She is alert and oriented to person, place, and time. Gait normal.  Skin: Skin is warm and dry. No rash noted. She is not diaphoretic.  Psychiatric: Mood, memory, affect and judgment normal.  Nursing note and vitals reviewed.   Urgent Care Treatments / Results:   Orders Placed This Encounter  Procedures  . DG Chest 2 View    LABS: PLEASE NOTE: all labs that were ordered this encounter are listed, however only abnormal results are displayed. Labs Reviewed - No data to display  EKG: -None  RADIOLOGY: DG Chest 2 View  Result Date: 05/10/2019 CLINICAL DATA:  59 year old diagnosed with COVID-19 on 04/28/2019, presenting with acute onset of cough  and shortness of breath. EXAM: CHEST - 2 VIEW COMPARISON:  01/27/2015, 11/03/2012. FINDINGS: Cardiac silhouette normal in size, unchanged. Thoracic aorta markedly tortuous, unchanged. Hilar and mediastinal contours otherwise unremarkable. Patchy ground-glass airspace opacities in the superior segment of the RIGHT LOWER LOBE, new since prior examinations. Lungs otherwise clear. No localized airspace consolidation. No pleural effusions. No pneumothorax. Normal pulmonary vascularity. Normal bronchovascular markings. Mild degenerative changes involving mid thoracic spine. IMPRESSION: 1. Acute ground-glass pneumonia involving the superior segment of the RIGHT LOWER LOBE. 2. No acute cardiopulmonary disease otherwise. Electronically Signed   By: Evangeline Dakin M.D.   On: 05/10/2019 14:45    PROCEDURES: Procedures  MEDICATIONS RECEIVED THIS VISIT: Medications - No data to display  PERTINENT CLINICAL COURSE NOTES/UPDATES:   Initial Impression / Assessment and Plan / Urgent Care Course:  Pertinent labs & imaging results that were available during my care of the patient were personally reviewed by me and considered in my medical decision making (see lab/imaging section of note for values and interpretations).  Barbara Wade is a 59 y.o. female who presents to [facility] today with complaints of Shortness of Breath and Cough  Acutely ill appearing; fatigued. She does not appear to be in any acute distress. Presenting symptoms (see HPI) and exam as documented above. Worsening cough and shortness of breath since SARS-CoV-2 was diagnosed on 04/27/2019. Will pursue workup as follows:   Radiographs of the chest performed today revealed patchy opacities in the patient's RLL consistent with pneumonia.   Results reviewed with patient. Imaging gives rise to concern for post-COVID (viral) versus a superimposed bacterial pneumonia. While symptoms have been present since diagnosis, her cough has  worsened and has become more productive over the course of the last 2 days. Patient was treated with monoclonal antibody (bamlanivimab) infusion at time of diagnosis. Given her co-morbidities (T2DM, OSAH, Body mass index is 35.61 kg/m.) will proceed with treatment for presumed superimposed bacterial etiology as follows:   Amoxicillin-clavulanate x 7 days + azithromycin x 5 days   Will send in a SABA (albuterol) MDI for patient to use for chest tightness, SOB, and to help reduce cough.    Patient has a diagnosis of T2DM. Last Hgb A1c was 10.7% in 11/2018. Will defer systemic steroid course (prednisone) at this point.  If she fails to respond to the aforementioned interventions, we could consider adding steroids to further help with her symptoms.  Patient to call clinic and/or PCP if not improving, at which time the steroids will then be considered.    Patient already taking benzonatate (Tessalon), and has a a retained supply at home. Patient to continue this intervention as prescribed by Dr. Derrel Nip.   Current clinical condition warrants patient being out of work in order to recover from her current injury/illness. She was provided with the appropriate documentation to provide to her place of employment that will allow for her to RTW on 05/14/2019 with no restrictions.   Discussed follow up with primary care physician in 1 week for re-evaluation. I have reviewed the follow up and strict return precautions for any new or worsening symptoms. Patient is aware of symptoms that would be deemed urgent/emergent, and would thus require further evaluation either here or in the emergency department. At the time of discharge, she verbalized understanding and consent with the discharge plan as it was reviewed with her. All questions were fielded by provider and/or clinic staff prior to patient discharge.    Final Clinical Impressions / Urgent Care Diagnoses:   Final diagnoses:  Community acquired pneumonia of  right lower lobe of lung    New Prescriptions:  [place] Controlled Substance Registry consulted? Not Applicable  Meds ordered this encounter  Medications  . amoxicillin-clavulanate (AUGMENTIN) 875-125 MG tablet    Sig: Take 1 tablet by mouth 2 (two) times daily for 7 days.    Dispense:  14 tablet    Refill:  0  . albuterol (VENTOLIN HFA) 108 (90 Base) MCG/ACT inhaler    Sig: Inhale 1-2 puffs into the lungs every 6 (six) hours as needed for wheezing or shortness of breath.    Dispense:  18 g    Refill:  0    Please provide spacer  . azithromycin (ZITHROMAX) 250 MG tablet    Sig: Take 1 tablet (250 mg total) by mouth daily. Take first 2 tablets together, then 1 every day until finished.    Dispense:  6 tablet    Refill:  0    Recommended Follow up Care:  Patient encouraged to follow up with the following provider within the specified time frame, or sooner as dictated by the severity of her symptoms. As always, she was instructed that for any urgent/emergent care needs, she should seek care either here or in the emergency department for more immediate evaluation.  Follow-up Information    Crecencio Mc, MD In 1 week.   Specialty: Internal Medicine Why: General reassessment of symptoms if not improving Contact information: [address] [place] [phone]         NOTE: This note was prepared using Dragon dictation software along with smaller phrase technology. Despite my best ability to proofread, there is the potential that transcriptional errors may still occur from this process, and are completely unintentional.    Karen Kitchens, NP 05/12/19 2241

## 2019-05-18 ENCOUNTER — Other Ambulatory Visit: Payer: Self-pay | Admitting: Internal Medicine

## 2019-05-19 ENCOUNTER — Other Ambulatory Visit: Payer: Self-pay | Admitting: Internal Medicine

## 2019-05-21 ENCOUNTER — Other Ambulatory Visit: Payer: Self-pay | Admitting: Internal Medicine

## 2019-06-19 ENCOUNTER — Other Ambulatory Visit: Payer: Self-pay | Admitting: Internal Medicine

## 2019-06-20 ENCOUNTER — Other Ambulatory Visit: Payer: Self-pay | Admitting: Internal Medicine

## 2019-07-22 ENCOUNTER — Other Ambulatory Visit: Payer: Self-pay | Admitting: Internal Medicine

## 2019-07-27 DIAGNOSIS — M542 Cervicalgia: Secondary | ICD-10-CM | POA: Diagnosis not present

## 2019-07-27 DIAGNOSIS — M546 Pain in thoracic spine: Secondary | ICD-10-CM | POA: Diagnosis not present

## 2019-07-27 DIAGNOSIS — M9901 Segmental and somatic dysfunction of cervical region: Secondary | ICD-10-CM | POA: Diagnosis not present

## 2019-07-27 DIAGNOSIS — M9902 Segmental and somatic dysfunction of thoracic region: Secondary | ICD-10-CM | POA: Diagnosis not present

## 2019-08-06 DIAGNOSIS — M9901 Segmental and somatic dysfunction of cervical region: Secondary | ICD-10-CM | POA: Diagnosis not present

## 2019-08-06 DIAGNOSIS — M9902 Segmental and somatic dysfunction of thoracic region: Secondary | ICD-10-CM | POA: Diagnosis not present

## 2019-08-06 DIAGNOSIS — M542 Cervicalgia: Secondary | ICD-10-CM | POA: Diagnosis not present

## 2019-08-06 DIAGNOSIS — M546 Pain in thoracic spine: Secondary | ICD-10-CM | POA: Diagnosis not present

## 2019-08-13 DIAGNOSIS — K449 Diaphragmatic hernia without obstruction or gangrene: Secondary | ICD-10-CM | POA: Diagnosis not present

## 2019-08-13 DIAGNOSIS — L659 Nonscarring hair loss, unspecified: Secondary | ICD-10-CM | POA: Diagnosis not present

## 2019-08-13 DIAGNOSIS — Z1159 Encounter for screening for other viral diseases: Secondary | ICD-10-CM | POA: Diagnosis not present

## 2019-08-13 DIAGNOSIS — E119 Type 2 diabetes mellitus without complications: Secondary | ICD-10-CM | POA: Diagnosis not present

## 2019-08-13 DIAGNOSIS — R69 Illness, unspecified: Secondary | ICD-10-CM | POA: Diagnosis not present

## 2019-08-13 DIAGNOSIS — Z8639 Personal history of other endocrine, nutritional and metabolic disease: Secondary | ICD-10-CM | POA: Diagnosis not present

## 2019-08-13 DIAGNOSIS — K589 Irritable bowel syndrome without diarrhea: Secondary | ICD-10-CM | POA: Insufficient documentation

## 2019-08-13 DIAGNOSIS — Z0000 Encounter for general adult medical examination without abnormal findings: Secondary | ICD-10-CM | POA: Diagnosis not present

## 2019-08-13 DIAGNOSIS — K581 Irritable bowel syndrome with constipation: Secondary | ICD-10-CM | POA: Diagnosis not present

## 2019-08-14 DIAGNOSIS — M9901 Segmental and somatic dysfunction of cervical region: Secondary | ICD-10-CM | POA: Diagnosis not present

## 2019-08-14 DIAGNOSIS — M9902 Segmental and somatic dysfunction of thoracic region: Secondary | ICD-10-CM | POA: Diagnosis not present

## 2019-08-14 DIAGNOSIS — M546 Pain in thoracic spine: Secondary | ICD-10-CM | POA: Diagnosis not present

## 2019-08-14 DIAGNOSIS — M542 Cervicalgia: Secondary | ICD-10-CM | POA: Diagnosis not present

## 2019-08-16 DIAGNOSIS — M9902 Segmental and somatic dysfunction of thoracic region: Secondary | ICD-10-CM | POA: Diagnosis not present

## 2019-08-16 DIAGNOSIS — M542 Cervicalgia: Secondary | ICD-10-CM | POA: Diagnosis not present

## 2019-08-16 DIAGNOSIS — M9901 Segmental and somatic dysfunction of cervical region: Secondary | ICD-10-CM | POA: Diagnosis not present

## 2019-08-16 DIAGNOSIS — M546 Pain in thoracic spine: Secondary | ICD-10-CM | POA: Diagnosis not present

## 2019-08-20 DIAGNOSIS — M546 Pain in thoracic spine: Secondary | ICD-10-CM | POA: Diagnosis not present

## 2019-08-20 DIAGNOSIS — M542 Cervicalgia: Secondary | ICD-10-CM | POA: Diagnosis not present

## 2019-08-20 DIAGNOSIS — M9902 Segmental and somatic dysfunction of thoracic region: Secondary | ICD-10-CM | POA: Diagnosis not present

## 2019-08-20 DIAGNOSIS — M9901 Segmental and somatic dysfunction of cervical region: Secondary | ICD-10-CM | POA: Diagnosis not present

## 2019-08-22 DIAGNOSIS — M9902 Segmental and somatic dysfunction of thoracic region: Secondary | ICD-10-CM | POA: Diagnosis not present

## 2019-08-22 DIAGNOSIS — M542 Cervicalgia: Secondary | ICD-10-CM | POA: Diagnosis not present

## 2019-08-22 DIAGNOSIS — M9901 Segmental and somatic dysfunction of cervical region: Secondary | ICD-10-CM | POA: Diagnosis not present

## 2019-08-22 DIAGNOSIS — M546 Pain in thoracic spine: Secondary | ICD-10-CM | POA: Diagnosis not present

## 2019-08-23 DIAGNOSIS — M9902 Segmental and somatic dysfunction of thoracic region: Secondary | ICD-10-CM | POA: Diagnosis not present

## 2019-08-23 DIAGNOSIS — M542 Cervicalgia: Secondary | ICD-10-CM | POA: Diagnosis not present

## 2019-08-23 DIAGNOSIS — M9901 Segmental and somatic dysfunction of cervical region: Secondary | ICD-10-CM | POA: Diagnosis not present

## 2019-08-23 DIAGNOSIS — M546 Pain in thoracic spine: Secondary | ICD-10-CM | POA: Diagnosis not present

## 2019-08-27 DIAGNOSIS — M542 Cervicalgia: Secondary | ICD-10-CM | POA: Diagnosis not present

## 2019-08-27 DIAGNOSIS — M9901 Segmental and somatic dysfunction of cervical region: Secondary | ICD-10-CM | POA: Diagnosis not present

## 2019-08-27 DIAGNOSIS — M9902 Segmental and somatic dysfunction of thoracic region: Secondary | ICD-10-CM | POA: Diagnosis not present

## 2019-08-27 DIAGNOSIS — M546 Pain in thoracic spine: Secondary | ICD-10-CM | POA: Diagnosis not present

## 2019-08-29 DIAGNOSIS — M542 Cervicalgia: Secondary | ICD-10-CM | POA: Diagnosis not present

## 2019-08-29 DIAGNOSIS — M546 Pain in thoracic spine: Secondary | ICD-10-CM | POA: Diagnosis not present

## 2019-08-29 DIAGNOSIS — M9902 Segmental and somatic dysfunction of thoracic region: Secondary | ICD-10-CM | POA: Diagnosis not present

## 2019-08-29 DIAGNOSIS — M9901 Segmental and somatic dysfunction of cervical region: Secondary | ICD-10-CM | POA: Diagnosis not present

## 2019-09-03 DIAGNOSIS — M546 Pain in thoracic spine: Secondary | ICD-10-CM | POA: Diagnosis not present

## 2019-09-03 DIAGNOSIS — M9901 Segmental and somatic dysfunction of cervical region: Secondary | ICD-10-CM | POA: Diagnosis not present

## 2019-09-03 DIAGNOSIS — M542 Cervicalgia: Secondary | ICD-10-CM | POA: Diagnosis not present

## 2019-09-03 DIAGNOSIS — M9902 Segmental and somatic dysfunction of thoracic region: Secondary | ICD-10-CM | POA: Diagnosis not present

## 2019-09-06 ENCOUNTER — Other Ambulatory Visit: Payer: Self-pay | Admitting: Internal Medicine

## 2019-09-20 DIAGNOSIS — M546 Pain in thoracic spine: Secondary | ICD-10-CM | POA: Diagnosis not present

## 2019-09-20 DIAGNOSIS — M9902 Segmental and somatic dysfunction of thoracic region: Secondary | ICD-10-CM | POA: Diagnosis not present

## 2019-09-20 DIAGNOSIS — M542 Cervicalgia: Secondary | ICD-10-CM | POA: Diagnosis not present

## 2019-09-20 DIAGNOSIS — M9901 Segmental and somatic dysfunction of cervical region: Secondary | ICD-10-CM | POA: Diagnosis not present

## 2019-09-28 DIAGNOSIS — R109 Unspecified abdominal pain: Secondary | ICD-10-CM | POA: Diagnosis not present

## 2019-09-28 DIAGNOSIS — K219 Gastro-esophageal reflux disease without esophagitis: Secondary | ICD-10-CM | POA: Diagnosis not present

## 2019-09-28 DIAGNOSIS — K2270 Barrett's esophagus without dysplasia: Secondary | ICD-10-CM | POA: Diagnosis not present

## 2019-09-28 DIAGNOSIS — R1310 Dysphagia, unspecified: Secondary | ICD-10-CM | POA: Diagnosis not present

## 2019-09-28 DIAGNOSIS — K449 Diaphragmatic hernia without obstruction or gangrene: Secondary | ICD-10-CM | POA: Diagnosis not present

## 2019-09-28 DIAGNOSIS — K581 Irritable bowel syndrome with constipation: Secondary | ICD-10-CM | POA: Diagnosis not present

## 2019-10-04 DIAGNOSIS — K5730 Diverticulosis of large intestine without perforation or abscess without bleeding: Secondary | ICD-10-CM

## 2019-10-04 DIAGNOSIS — Z136 Encounter for screening for cardiovascular disorders: Secondary | ICD-10-CM | POA: Diagnosis not present

## 2019-10-04 DIAGNOSIS — R197 Diarrhea, unspecified: Secondary | ICD-10-CM

## 2019-10-04 DIAGNOSIS — G5702 Lesion of sciatic nerve, left lower limb: Secondary | ICD-10-CM | POA: Diagnosis not present

## 2019-10-04 DIAGNOSIS — E119 Type 2 diabetes mellitus without complications: Secondary | ICD-10-CM | POA: Insufficient documentation

## 2019-10-31 DIAGNOSIS — K449 Diaphragmatic hernia without obstruction or gangrene: Secondary | ICD-10-CM | POA: Diagnosis not present

## 2019-10-31 DIAGNOSIS — K224 Dyskinesia of esophagus: Secondary | ICD-10-CM | POA: Diagnosis not present

## 2019-10-31 DIAGNOSIS — K219 Gastro-esophageal reflux disease without esophagitis: Secondary | ICD-10-CM | POA: Diagnosis not present

## 2019-10-31 DIAGNOSIS — R1319 Other dysphagia: Secondary | ICD-10-CM | POA: Diagnosis not present

## 2019-11-07 ENCOUNTER — Telehealth: Payer: Self-pay | Admitting: Internal Medicine

## 2019-11-07 NOTE — Telephone Encounter (Signed)
Left message for patient to call back and schedule Medicare Annual Wellness Visit (AWV)  ° °This should be a telephone visit only=30 minutes. ° °No hx of AWV; please schedule at anytime with Denisa O'Brien-Blaney at [facility] [place] ° ° °

## 2019-12-03 DIAGNOSIS — R69 Illness, unspecified: Secondary | ICD-10-CM | POA: Diagnosis not present

## 2019-12-03 DIAGNOSIS — Z1231 Encounter for screening mammogram for malignant neoplasm of breast: Secondary | ICD-10-CM | POA: Diagnosis not present

## 2019-12-03 LAB — HM MAMMOGRAPHY

## 2019-12-17 DIAGNOSIS — R69 Illness, unspecified: Secondary | ICD-10-CM | POA: Diagnosis not present

## 2019-12-28 DIAGNOSIS — Z23 Encounter for immunization: Secondary | ICD-10-CM | POA: Diagnosis not present

## 2019-12-28 DIAGNOSIS — H8103 Meniere's disease, bilateral: Secondary | ICD-10-CM | POA: Diagnosis not present

## 2019-12-28 DIAGNOSIS — K581 Irritable bowel syndrome with constipation: Secondary | ICD-10-CM | POA: Diagnosis not present

## 2019-12-28 DIAGNOSIS — E119 Type 2 diabetes mellitus without complications: Secondary | ICD-10-CM | POA: Diagnosis not present

## 2020-04-14 ENCOUNTER — Ambulatory Visit
Admission: EM | Admit: 2020-04-14 | Discharge: 2020-04-14 | Disposition: A | Discharge status: Home / Self Care | Payer: Medicare HMO | Attending: Family Medicine | Admitting: Family Medicine

## 2020-04-14 ENCOUNTER — Ambulatory Visit (INDEPENDENT_AMBULATORY_CARE_PROVIDER_SITE_OTHER): Payer: Medicare HMO

## 2020-04-14 ENCOUNTER — Other Ambulatory Visit: Payer: Self-pay

## 2020-04-14 ENCOUNTER — Encounter: Payer: Self-pay | Admitting: Emergency Medicine

## 2020-04-14 DIAGNOSIS — S99911A Unspecified injury of right ankle, initial encounter: Secondary | ICD-10-CM | POA: Diagnosis not present

## 2020-04-14 DIAGNOSIS — M7989 Other specified soft tissue disorders: Secondary | ICD-10-CM | POA: Diagnosis not present

## 2020-04-14 DIAGNOSIS — M7731 Calcaneal spur, right foot: Secondary | ICD-10-CM | POA: Diagnosis not present

## 2020-04-14 DIAGNOSIS — S93401A Sprain of unspecified ligament of right ankle, initial encounter: Secondary | ICD-10-CM

## 2020-04-14 DIAGNOSIS — M19071 Primary osteoarthritis, right ankle and foot: Secondary | ICD-10-CM | POA: Diagnosis not present

## 2020-04-14 MED ORDER — MELOXICAM 15 MG PO TABS: 15.0000 mg | ORAL_TABLET | Freq: Every day | ORAL | 0 refills | Status: DC | PRN

## 2020-04-14 NOTE — ED Provider Notes (Signed)
[facility]    CSN: [id number] Arrival date & time: 04/14/20  1010  History   Chief Complaint Chief Complaint  Patient presents with  . Ankle Injury  . Ankle Pain   HPI  60 year old female presents with the above complaints.  Patient reports that she twisted her right ankle on Saturday.  She states that she was walking and stepped on drainage area and twisted her right ankle.  She reports lateral pain.  She also reports pain of the distal foot.  There is no appreciable redness or bruising.  She states that she is able to walk.  Pain 7/10 in severity.  Described as an aching sensation.  No relieving factors.  No other complaints.  Past Medical History:  Diagnosis Date  . Allergy   . Anaphylaxis 2007   occurred during allergy testing to environmental allergen  . Chronic headaches   . Colon polyps   . Depression    managed currently with [facility]  . Diabetes mellitus without complication ([place])   . Diverticulosis   . Fibromyalgia 2004  . Gallstones   . Hepatitis B virus infection   . History of 2019 novel coronavirus disease (COVID-19) 04/28/2019  . Irritable bowel syndrome (IBS)   . Meniere's disease   . Neuromuscular disorder ([place])   . Sleep apnea     Patient Active Problem List   Diagnosis Date Noted  . Hidradenitis suppurativa 10/24/2018  . CLE (columnar lined esophagus)   . Gastritis without bleeding   . Diarrhea   . Special screening for malignant neoplasms, colon   . Benign neoplasm of descending colon   . Diverticulosis of large intestine without diverticulitis   . Essential hypertension 11/22/2017  . S/P abdominal hysterectomy 07/04/2016  . Xiphoidalgia syndrome 09/23/2015  . Esophageal reflux 11/12/2013  . Sliding hiatal hernia 11/12/2013  . Trigger finger, acquired 09/19/2013  . Postprandial epigastric pain 11/01/2012  . Rosacea 05/02/2012  . Irritable bowel syndrome (IBS)   . Morbid obesity ([place]) 01/31/2012  . Vitamin D deficiency  01/05/2012  . Hand pain, right 12/15/2011  . Pain of left heel 12/15/2011  . Lymphedema of leg 07/17/2011  . Rhinitis, allergic 07/17/2011  . Type 2 diabetes mellitus with complication, without long-term current use of insulin ([place]) 12/02/2010  . Hyperlipidemia with target LDL less than 70 12/02/2010  . Hepatitis B virus infection   . History of colon polyps 10/16/2010  . Encounter for preventive health examination 10/16/2010  . History of food anaphylaxis   . Fibromyalgia     Past Surgical History:  Procedure Laterality Date  . ABDOMINAL HYSTERECTOMY  2000   for fibroids  . CHOLECYSTECTOMY     elective  . COLONOSCOPY WITH PROPOFOL N/A 02/20/2018   Procedure: COLONOSCOPY WITH PROPOFOL;  Surgeon: Virgel Manifold, MD;  Location: [facility] ENDOSCOPY;  Service: Endoscopy;  Laterality: N/A;  . ESOPHAGOGASTRODUODENOSCOPY (EGD) WITH PROPOFOL N/A 02/20/2018   Procedure: ESOPHAGOGASTRODUODENOSCOPY (EGD) WITH PROPOFOL;  Surgeon: Virgel Manifold, MD;  Location: [facility] ENDOSCOPY;  Service: Endoscopy;  Laterality: N/A;  . KNEE ARTHROSCOPY Left 2004  . PILONIDAL CYST EXCISION  1983  . TRIGGER FINGER RELEASE Bilateral     OB History   No obstetric history on file.      Home Medications    Prior to Admission medications   Medication Sig Start Date End Date Taking? Authorizing Provider  albuterol (VENTOLIN HFA) 108 (90 Base) MCG/ACT inhaler Inhale 1-2 puffs into the lungs every 6 (  six) hours as needed for wheezing or shortness of breath. 05/10/19  Yes Karen Kitchens, NP  CVS D3 1000 units capsule TAKE 2 CAPSULES BY MOUTH DAILY 06/10/15  Yes Crecencio Mc, MD  dicyclomine (BENTYL) 20 MG tablet TAKE ONE TABLET BY MOUTH EVERY 6 HOURS 07/24/19  Yes Crecencio Mc, MD  EPINEPHrine (EPIPEN) 0.3 mg/0.3 mL DEVI Inject 0.3 mLs (0.3 mg total) into the muscle once. 05/11/11  Yes Crecencio Mc, MD  fluticasone (FLONASE) 50 MCG/ACT nasal spray Place 2 sprays into the nose daily as needed.  07/14/11  Yes  Crecencio Mc, MD  glipiZIDE (GLUCOTROL) 10 MG tablet TAKE ONE TABLET BY MOUTH TWICE A DAY BEFORE A MEAL 09/06/19  Yes Crecencio Mc, MD  glucose blood (ONETOUCH VERIO) test strip Used to check blood sugars twice per day. 12/01/18  Yes Guse, Jacquelynn Cree, FNP  hyoscyamine (LEVSIN SL) 0.125 MG SL tablet DISSOLVE ONE TABLET UNDER THE TONGUE EVERY 4 HOURS AS NEEDED FOR CRAMPING 05/21/19  Yes Crecencio Mc, MD  Lactobacillus (PROBIOTIC ACIDOPHILUS PO) Take 1 capsule by mouth 2 (two) times daily.   Yes [provider]  Lancets Glory Rosebush ULTRASOFT) lancets Use as instructed 12/01/18  Yes Guse, Jacquelynn Cree, FNP  meclizine (ANTIVERT) 25 MG tablet Take 1 tablet (25 mg total) by mouth 2 (two) times daily. As needed for vertigo 02/08/18  Yes Crecencio Mc, MD  meloxicam (MOBIC) 15 MG tablet Take 1 tablet (15 mg total) by mouth daily as needed for pain. 04/14/20  Yes Latresa Gasser G, DO  triamterene-hydrochlorothiazide (MAXZIDE-25) 37.5-25 MG tablet Take 1 tablet by mouth daily. 11/06/18  Yes Crecencio Mc, MD  TRULICITY 1.5 PP/5.0DT SOPN INJECT 1.5 MG SUBQ ONCE A WEEK 05/18/19  Yes Crecencio Mc, MD  fexofenadine (ALLEGRA) 180 MG tablet Take 1 tablet (180 mg total) by mouth daily. Patient taking differently: Take 180 mg by mouth daily as needed.  04/08/14 04/28/19  Crecencio Mc, MD    Family History Family History  Problem Relation Age of Onset  . Heart disease Maternal Aunt   . Heart disease Maternal Uncle   . Heart disease Maternal Grandmother   . Stroke Maternal Grandmother   . Healthy Mother   . Diabetes Father   . Colon polyps Father   . Colon cancer Paternal Grandmother   . Colon polyps Paternal Uncle     Social History Social History   Tobacco Use  . Smoking status: Former Smoker    Packs/day: 1.00    Years: 20.00    Pack years: 20.00    Types: Cigarettes    Quit date: 10/16/2002    Years since quitting: 17.5  . Smokeless tobacco: Never Used  Vaping Use  . Vaping Use: Never  used  Substance Use Topics  . Alcohol use: Yes    Alcohol/week: 0.0 - 1.0 standard drinks    Comment: occasional  . Drug use: No     Allergies   Hydrocodone, Plum pulp, and Prunus persica   Review of Systems Review of Systems  Constitutional: Negative.   Musculoskeletal:       Right ankle and foot pain.   Physical Exam Triage Vital Signs ED Triage Vitals  Enc Vitals Group     BP 04/14/20 1037 (!) 141/77     Pulse Rate 04/14/20 1037 63     Resp 04/14/20 1037 18     Temp 04/14/20 1037 98.1 F (36.7 C)  Temp Source 04/14/20 1037 Oral     SpO2 04/14/20 1037 98 %     Weight 04/14/20 1034 205 lb (93 kg)     Height 04/14/20 1034 5\' 3"  (1.6 m)     Head Circumference --      Peak Flow --      Pain Score 04/14/20 1034 7     Pain Loc --      Pain Edu? --      Excl. in [place]? --    No data found.  Updated Vital Signs BP (!) 141/77 (BP Location: Right Arm)   Pulse 63   Temp 98.1 F (36.7 C) (Oral)   Resp 18   Ht 5\' 3"  (1.6 m)   Wt 93 kg   SpO2 98%   BMI 36.31 kg/m   Visual Acuity Right Eye Distance:   Left Eye Distance:   Bilateral Distance:    Right Eye Near:   Left Eye Near:    Bilateral Near:     Physical Exam Vitals and nursing note reviewed.  Constitutional:      General: She is not in acute distress.    Appearance: Normal appearance. She is not ill-appearing.  HENT:     Head: Normocephalic and atraumatic.  Eyes:     General:        Right eye: No discharge.        Left eye: No discharge.     Conjunctiva/sclera: Conjunctivae normal.  Pulmonary:     Effort: Pulmonary effort is normal. No respiratory distress.  Musculoskeletal:     Comments: Right foot and ankle -patient with tenderness around the lateral malleolus.  No tenderness at the base of the fifth metatarsal.  Patient also has some tenderness to palpation at the first MTP joint.  No appreciable bruising.  There is some swelling around the lateral malleolus.  Neurological:     Mental Status:  She is alert.  Psychiatric:        Mood and Affect: Mood normal.        Behavior: Behavior normal.    UC Treatments / Results  Labs (all labs ordered are listed, but only abnormal results are displayed) Labs Reviewed - No data to display  EKG   Radiology DG Ankle Complete Right  Result Date: 04/14/2020 CLINICAL DATA:  Right ankle twisting injury on Saturday with lateral right ankle. EXAM: RIGHT ANKLE - COMPLETE 3+ VIEW COMPARISON:  None. FINDINGS: Generalized mild right ankle soft tissue swelling. No fracture or subluxation. Small Achilles and plantar right calcaneal spurs. No focal osseous lesions. No radiopaque foreign bodies. IMPRESSION: Generalized mild right ankle soft tissue swelling, with no fracture or subluxation. Small Achilles and plantar right calcaneal spurs. Electronically Signed   By: Ilona Sorrel M.D.   On: 04/14/2020 11:04   DG Foot Complete Right  Result Date: 04/14/2020 CLINICAL DATA:  Testing injury to the right ankle on Saturday with dorsal right foot pain EXAM: RIGHT FOOT COMPLETE - 3+ VIEW COMPARISON:  None. FINDINGS: No fracture or dislocation. Lisfranc joint appears intact. No suspicious focal osseous lesions. Small Achilles and plantar right calcaneal spurs. No radiopaque foreign bodies. Mild first MTP joint osteoarthritis. IMPRESSION: No fracture or dislocation. Mild first MTP joint osteoarthritis. Small Achilles and plantar right calcaneal spurs. Electronically Signed   By: Ilona Sorrel M.D.   On: 04/14/2020 11:06    Procedures Procedures (including critical care time)  Medications Ordered in UC Medications - No data to display  Initial Impression /  Assessment and Plan / UC Course  I have reviewed the triage vital signs and the nursing notes.  Pertinent labs & imaging results that were available during my care of the patient were reviewed by me and considered in my medical decision making (see chart for details).    60 year old female presents with a  right ankle sprain.  X-rays of the right foot and ankle were obtained today.  Independently reviewed by me.  Interpretation: No acute findings.  No fracture or dislocation.  Advise rest, ice, compression, and elevation.  Meloxicam as directed.  Supportive care.  Work note given.  Final Clinical Impressions(s) / UC Diagnoses   Final diagnoses:  Sprain of right ankle, unspecified ligament, initial encounter     Discharge Instructions     Rest, ice, compression, elevation.  Medication daily as needed for pain.  Take care  Dr. Lacinda Axon     ED Prescriptions    Medication Sig Dispense Auth. Provider   meloxicam (MOBIC) 15 MG tablet Take 1 tablet (15 mg total) by mouth daily as needed for pain. 30 tablet Coral Spikes, DO     PDMP not reviewed this encounter.   Coral Spikes, [place] 04/14/20 1124

## 2020-04-14 NOTE — Discharge Instructions (Signed)
Rest, ice, compression, elevation.  Medication daily as needed for pain.  Take care  Dr. Lacinda Axon

## 2020-04-14 NOTE — ED Triage Notes (Signed)
Patient c/o twisting her right ankle on Saturday. She is c/o right ankle and top of her foot pain.

## 2020-05-20 DIAGNOSIS — J309 Allergic rhinitis, unspecified: Secondary | ICD-10-CM | POA: Diagnosis not present

## 2020-05-20 DIAGNOSIS — H903 Sensorineural hearing loss, bilateral: Secondary | ICD-10-CM | POA: Diagnosis not present

## 2020-05-20 DIAGNOSIS — J340 Abscess, furuncle and carbuncle of nose: Secondary | ICD-10-CM | POA: Diagnosis not present

## 2020-05-20 DIAGNOSIS — H8109 Meniere's disease, unspecified ear: Secondary | ICD-10-CM | POA: Diagnosis not present

## 2020-08-08 DIAGNOSIS — E010 Iodine-deficiency related diffuse (endemic) goiter: Secondary | ICD-10-CM | POA: Diagnosis not present

## 2020-08-08 DIAGNOSIS — Z6841 Body Mass Index (BMI) 40.0 and over, adult: Secondary | ICD-10-CM | POA: Diagnosis not present

## 2020-08-08 DIAGNOSIS — R221 Localized swelling, mass and lump, neck: Secondary | ICD-10-CM | POA: Diagnosis not present

## 2020-08-08 DIAGNOSIS — R600 Localized edema: Secondary | ICD-10-CM | POA: Diagnosis not present

## 2020-08-08 DIAGNOSIS — E119 Type 2 diabetes mellitus without complications: Secondary | ICD-10-CM | POA: Diagnosis not present

## 2020-08-27 DIAGNOSIS — L659 Nonscarring hair loss, unspecified: Secondary | ICD-10-CM | POA: Diagnosis not present

## 2020-08-27 DIAGNOSIS — E876 Hypokalemia: Secondary | ICD-10-CM | POA: Diagnosis not present

## 2020-08-27 DIAGNOSIS — E010 Iodine-deficiency related diffuse (endemic) goiter: Secondary | ICD-10-CM | POA: Diagnosis not present

## 2020-08-27 DIAGNOSIS — R635 Abnormal weight gain: Secondary | ICD-10-CM | POA: Diagnosis not present

## 2020-08-27 DIAGNOSIS — M7989 Other specified soft tissue disorders: Secondary | ICD-10-CM | POA: Diagnosis not present

## 2020-08-27 DIAGNOSIS — R0602 Shortness of breath: Secondary | ICD-10-CM | POA: Diagnosis not present

## 2020-09-16 DIAGNOSIS — R0602 Shortness of breath: Secondary | ICD-10-CM | POA: Diagnosis not present

## 2020-09-16 DIAGNOSIS — M7989 Other specified soft tissue disorders: Secondary | ICD-10-CM | POA: Diagnosis not present

## 2020-09-16 DIAGNOSIS — R635 Abnormal weight gain: Secondary | ICD-10-CM | POA: Diagnosis not present

## 2020-10-22 DIAGNOSIS — J069 Acute upper respiratory infection, unspecified: Secondary | ICD-10-CM | POA: Diagnosis not present

## 2020-10-22 DIAGNOSIS — R600 Localized edema: Secondary | ICD-10-CM | POA: Diagnosis not present

## 2020-10-22 DIAGNOSIS — E119 Type 2 diabetes mellitus without complications: Secondary | ICD-10-CM | POA: Diagnosis not present

## 2020-10-22 DIAGNOSIS — J029 Acute pharyngitis, unspecified: Secondary | ICD-10-CM | POA: Diagnosis not present

## 2020-10-31 DIAGNOSIS — R600 Localized edema: Secondary | ICD-10-CM | POA: Diagnosis not present

## 2020-10-31 DIAGNOSIS — E010 Iodine-deficiency related diffuse (endemic) goiter: Secondary | ICD-10-CM | POA: Diagnosis not present

## 2020-10-31 DIAGNOSIS — R601 Generalized edema: Secondary | ICD-10-CM | POA: Diagnosis not present

## 2020-11-03 DIAGNOSIS — L659 Nonscarring hair loss, unspecified: Secondary | ICD-10-CM | POA: Diagnosis not present

## 2020-11-03 DIAGNOSIS — E049 Nontoxic goiter, unspecified: Secondary | ICD-10-CM | POA: Diagnosis not present

## 2020-11-04 ENCOUNTER — Ambulatory Visit
Admission: EM | Admit: 2020-11-04 | Discharge: 2020-11-04 | Disposition: A | Discharge status: Home / Self Care | Payer: Medicare HMO | Attending: Physician Assistant | Admitting: Physician Assistant

## 2020-11-04 ENCOUNTER — Encounter: Payer: Self-pay | Admitting: Emergency Medicine

## 2020-11-04 ENCOUNTER — Other Ambulatory Visit: Payer: Self-pay

## 2020-11-04 DIAGNOSIS — B349 Viral infection, unspecified: Secondary | ICD-10-CM

## 2020-11-04 DIAGNOSIS — Z87891 Personal history of nicotine dependence: Secondary | ICD-10-CM | POA: Insufficient documentation

## 2020-11-04 DIAGNOSIS — Z8616 Personal history of COVID-19: Secondary | ICD-10-CM | POA: Insufficient documentation

## 2020-11-04 DIAGNOSIS — J029 Acute pharyngitis, unspecified: Secondary | ICD-10-CM | POA: Insufficient documentation

## 2020-11-04 DIAGNOSIS — R0981 Nasal congestion: Secondary | ICD-10-CM | POA: Diagnosis not present

## 2020-11-04 DIAGNOSIS — R6883 Chills (without fever): Secondary | ICD-10-CM | POA: Insufficient documentation

## 2020-11-04 DIAGNOSIS — R059 Cough, unspecified: Secondary | ICD-10-CM | POA: Insufficient documentation

## 2020-11-04 DIAGNOSIS — Z20822 Contact with and (suspected) exposure to covid-19: Secondary | ICD-10-CM | POA: Diagnosis not present

## 2020-11-04 NOTE — ED Triage Notes (Signed)
Pt presents today with c/o nasal congestion, sore throat, cough and chills that began last night. No meds pta. No home Covid tests. *Requests to be tested today for Covid.

## 2020-11-04 NOTE — Discharge Instructions (Addendum)
Quarantine until results are back usually in 24 hours, check on my chart for results. If positive will need to quarantine for 5-7 days. May take OTC meds for symptom management.  Return as needed.

## 2020-11-04 NOTE — ED Provider Notes (Signed)
[facility]    CSN: [id number] Arrival date & time: 11/04/20  0943      History   Chief Complaint Chief Complaint  Patient presents with   Nasal Congestion   Chills   Sore Throat    HPI Barbara Wade is a 60 y.o. female.   61 year old female patient presents to urgent care chief complaint of nasal congestion sore throat cough and chills that started last night.  Patient states she has been exposed to COVID positive person, requesting COVID test today.  The history is provided by the patient. No language interpreter was used.   Past Medical History:  Diagnosis Date   Allergy    Anaphylaxis 2007   occurred during allergy testing to environmental allergen   Chronic headaches    Colon polyps    Depression    managed currently with [facility]   Diabetes mellitus without complication ([place])    Diverticulosis    Fibromyalgia 2004   Gallstones    Hepatitis B virus infection    History of 2019 novel coronavirus disease (COVID-19) 04/28/2019   Irritable bowel syndrome (IBS)    Meniere's disease    Neuromuscular disorder ([place])    Sleep apnea     Patient Active Problem List   Diagnosis Date Noted   Nonspecific syndrome suggestive of viral illness 11/04/2020   Hidradenitis suppurativa 10/24/2018   CLE (columnar lined esophagus)    Gastritis without bleeding    Diarrhea    Special screening for malignant neoplasms, colon    Benign neoplasm of descending colon    Diverticulosis of large intestine without diverticulitis    Essential hypertension 11/22/2017   S/P abdominal hysterectomy 07/04/2016   Xiphoidalgia syndrome 09/23/2015   Esophageal reflux 11/12/2013   Sliding hiatal hernia 11/12/2013   Trigger finger, acquired 09/19/2013   Postprandial epigastric pain 11/01/2012   Rosacea 05/02/2012   Irritable bowel syndrome (IBS)    Morbid obesity ([place]) 01/31/2012   Vitamin D deficiency 01/05/2012   Hand pain, right 12/15/2011   Pain of left  heel 12/15/2011   Lymphedema of leg 07/17/2011   Rhinitis, allergic 07/17/2011   Type 2 diabetes mellitus with complication, without long-term current use of insulin ([place]) 12/02/2010   Hyperlipidemia with target LDL less than 70 12/02/2010   Hepatitis B virus infection    History of colon polyps 10/16/2010   Encounter for preventive health examination 10/16/2010   History of food anaphylaxis    Fibromyalgia     Past Surgical History:  Procedure Laterality Date   ABDOMINAL HYSTERECTOMY  2000   for fibroids   CHOLECYSTECTOMY     elective   COLONOSCOPY WITH PROPOFOL N/A 02/20/2018   Procedure: COLONOSCOPY WITH PROPOFOL;  Surgeon: Virgel Manifold, MD;  Location: [facility] ENDOSCOPY;  Service: Endoscopy;  Laterality: N/A;   ESOPHAGOGASTRODUODENOSCOPY (EGD) WITH PROPOFOL N/A 02/20/2018   Procedure: ESOPHAGOGASTRODUODENOSCOPY (EGD) WITH PROPOFOL;  Surgeon: Virgel Manifold, MD;  Location: [facility] ENDOSCOPY;  Service: Endoscopy;  Laterality: N/A;   KNEE ARTHROSCOPY Left 2004   PILONIDAL CYST EXCISION  1983   TRIGGER FINGER RELEASE Bilateral     OB History   No obstetric history on file.      Home Medications    Prior to Admission medications   Medication Sig Start Date End Date Taking? Authorizing Provider  albuterol (VENTOLIN HFA) 108 (90 Base) MCG/ACT inhaler Inhale 1-2 puffs into the lungs every 6 (six) hours as needed for wheezing or shortness of breath.  05/10/19   Karen Kitchens, NP  [employer] D3 1000 units capsule TAKE 2 CAPSULES BY MOUTH DAILY 06/10/15   Crecencio Mc, MD  dicyclomine (BENTYL) 20 MG tablet TAKE ONE TABLET BY MOUTH EVERY 6 HOURS 07/24/19   Crecencio Mc, MD  EPINEPHrine (EPIPEN) 0.3 mg/0.3 mL DEVI Inject 0.3 mLs (0.3 mg total) into the muscle once. 05/11/11   Crecencio Mc, MD  fexofenadine (ALLEGRA) 180 MG tablet Take 1 tablet (180 mg total) by mouth daily. Patient taking differently: Take 180 mg by mouth daily as needed.  04/08/14 04/28/19  Crecencio Mc, MD   fluticasone (FLONASE) 50 MCG/ACT nasal spray Place 2 sprays into the nose daily as needed.  07/14/11   Crecencio Mc, MD  glipiZIDE (GLUCOTROL) 10 MG tablet TAKE ONE TABLET BY MOUTH TWICE A DAY BEFORE A MEAL 09/06/19   Crecencio Mc, MD  glucose blood (ONETOUCH VERIO) test strip Used to check blood sugars twice per day. 12/01/18   Guse, Jacquelynn Cree, FNP  hyoscyamine (LEVSIN SL) 0.125 MG SL tablet DISSOLVE ONE TABLET UNDER THE TONGUE EVERY 4 HOURS AS NEEDED FOR CRAMPING 05/21/19   Crecencio Mc, MD  Lactobacillus (PROBIOTIC ACIDOPHILUS PO) Take 1 capsule by mouth 2 (two) times daily.    [provider]  Lancets Glory [place]bush ULTRASOFT) lancets Use as instructed 12/01/18   Guse, Jacquelynn Cree, FNP  meclizine (ANTIVERT) 25 MG tablet Take 1 tablet (25 mg total) by mouth 2 (two) times daily. As needed for vertigo 02/08/18   Crecencio Mc, MD  meloxicam (MOBIC) 15 MG tablet Take 1 tablet (15 mg total) by mouth daily as needed for pain. 04/14/20   Coral Spikes, DO  triamterene-hydrochlorothiazide (MAXZIDE-25) 37.5-25 MG tablet Take 1 tablet by mouth daily. 11/06/18   Crecencio Mc, MD  TRULICITY 1.5 NW/2.9FA SOPN INJECT 1.5 MG SUBQ ONCE A WEEK 05/18/19   Crecencio Mc, MD    Family History Family History  Problem Relation Age of Onset   Heart disease Maternal Aunt    Heart disease Maternal Uncle    Heart disease Maternal Grandmother    Stroke Maternal Grandmother    Healthy Mother    Diabetes Father    Colon polyps Father    Colon cancer Paternal Grandmother    Colon polyps Paternal Uncle     Social History Social History   Tobacco Use   Smoking status: Former    Packs/day: 1.00    Years: 20.00    Pack years: 20.00    Types: Cigarettes    Quit date: 10/16/2002    Years since quitting: 18.0   Smokeless tobacco: Never  Vaping Use   Vaping Use: Never used  Substance Use Topics   Alcohol use: Yes    Alcohol/week: 0.0 - 1.0 standard drinks    Comment: occasional   Drug use: No      Allergies   Hydrocodone, Plum pulp, and Prunus persica   Review of Systems Review of Systems  Constitutional:  Positive for chills. Negative for fever.  HENT:  Positive for congestion and sore throat.   Respiratory:  Positive for cough. Negative for wheezing.   Cardiovascular:  Negative for chest pain.  All other systems reviewed and are negative.   Physical Exam Triage Vital Signs ED Triage Vitals  Enc Vitals Group     BP 11/04/20 1120 131/67     Pulse Rate 11/04/20 1120 65     Resp 11/04/20 1120 18  Temp 11/04/20 1120 98.4 F (36.9 C)     Temp Source 11/04/20 1120 Oral     SpO2 11/04/20 1120 100 %     Weight --      Height --      Head Circumference --      Peak Flow --      Pain Score 11/04/20 1117 0     Pain Loc --      Pain Edu? --      Excl. in [place]? --    No data found.  Updated Vital Signs BP 131/67 (BP Location: Left Arm)   Pulse 65   Temp 98.4 F (36.9 C) (Oral)   Resp 18   SpO2 100%   Visual Acuity Right Eye Distance:   Left Eye Distance:   Bilateral Distance:    Right Eye Near:   Left Eye Near:    Bilateral Near:     Physical Exam Vitals and nursing note reviewed.  Constitutional:      General: She is not in acute distress.    Appearance: She is well-developed.  HENT:     Head: Normocephalic.     Right Ear: Tympanic membrane is retracted.     Left Ear: Tympanic membrane is retracted.     Nose: Congestion present.     Mouth/Throat:     Lips: Pink.     Mouth: Mucous membranes are moist.     Pharynx: Oropharynx is clear.  Eyes:     General: Lids are normal.     Conjunctiva/sclera: Conjunctivae normal.     Pupils: Pupils are equal, round, and reactive to light.  Neck:     Trachea: Trachea normal. No tracheal deviation.  Cardiovascular:     Rate and Rhythm: Normal rate and regular rhythm.     Pulses: Normal pulses.     Heart sounds: Normal heart sounds. No murmur heard. Pulmonary:     Effort: Pulmonary effort is normal.      Breath sounds: Normal breath sounds and air entry.  Abdominal:     General: Bowel sounds are normal.     Palpations: Abdomen is soft.     Tenderness: There is no abdominal tenderness.  Musculoskeletal:        General: Normal range of motion.     Cervical back: Full passive range of motion without pain and normal range of motion.  Lymphadenopathy:     Cervical: No cervical adenopathy.  Skin:    General: Skin is warm and dry.     Findings: No rash.  Neurological:     General: No focal deficit present.     Mental Status: She is alert and oriented to person, place, and time.     GCS: GCS eye subscore is 4. GCS verbal subscore is 5. GCS motor subscore is 6.  Psychiatric:        Attention and Perception: Attention normal.        Mood and Affect: Mood normal.        Speech: Speech normal.        Behavior: Behavior normal. Behavior is cooperative.     UC Treatments / Results  Labs (all labs ordered are listed, but only abnormal results are displayed) Labs Reviewed  SARS CORONAVIRUS 2 (TAT 6-24 HRS)    EKG   Radiology No results found.  Procedures Procedures (including critical care time)  Medications Ordered in UC Medications - No data to display  Initial Impression / Assessment and Plan /  UC Course  I have reviewed the triage vital signs and the nursing notes.  Pertinent labs & imaging results that were available during my care of the patient were reviewed by me and considered in my medical decision making (see chart for details).    Ddx: Viral illness, COVID, seasonal allergies Final Clinical Impressions(s) / UC Diagnoses   Final diagnoses:  Nonspecific syndrome suggestive of viral illness     Discharge Instructions      Quarantine until results are back usually in 24 hours, check on my chart for results. If positive will need to quarantine for 5-7 days. May take OTC meds for symptom management.  Return as needed.     ED Prescriptions   None    PDMP not  reviewed this encounter.   Tori Milks, NP 95/97/47 1302

## 2020-11-05 LAB — SARS CORONAVIRUS 2 (TAT 6-24 HRS): SARS Coronavirus 2: NEGATIVE

## 2020-11-24 DIAGNOSIS — E119 Type 2 diabetes mellitus without complications: Secondary | ICD-10-CM | POA: Diagnosis not present

## 2020-12-15 DIAGNOSIS — Z1231 Encounter for screening mammogram for malignant neoplasm of breast: Secondary | ICD-10-CM | POA: Diagnosis not present

## 2021-01-07 DIAGNOSIS — J019 Acute sinusitis, unspecified: Secondary | ICD-10-CM | POA: Diagnosis not present

## 2021-02-04 DIAGNOSIS — I890 Lymphedema, not elsewhere classified: Secondary | ICD-10-CM | POA: Diagnosis not present

## 2021-02-04 DIAGNOSIS — R600 Localized edema: Secondary | ICD-10-CM | POA: Diagnosis not present

## 2021-02-04 DIAGNOSIS — R601 Generalized edema: Secondary | ICD-10-CM | POA: Diagnosis not present

## 2021-02-04 DIAGNOSIS — E119 Type 2 diabetes mellitus without complications: Secondary | ICD-10-CM | POA: Diagnosis not present

## 2021-02-13 DIAGNOSIS — I890 Lymphedema, not elsewhere classified: Secondary | ICD-10-CM | POA: Diagnosis not present

## 2021-02-16 DIAGNOSIS — I890 Lymphedema, not elsewhere classified: Secondary | ICD-10-CM | POA: Diagnosis not present

## 2021-02-25 DIAGNOSIS — I890 Lymphedema, not elsewhere classified: Secondary | ICD-10-CM | POA: Diagnosis not present

## 2021-02-27 DIAGNOSIS — Z6841 Body Mass Index (BMI) 40.0 and over, adult: Secondary | ICD-10-CM | POA: Diagnosis not present

## 2021-02-27 DIAGNOSIS — R0600 Dyspnea, unspecified: Secondary | ICD-10-CM | POA: Diagnosis not present

## 2021-02-27 DIAGNOSIS — E669 Obesity, unspecified: Secondary | ICD-10-CM | POA: Diagnosis not present

## 2021-02-27 DIAGNOSIS — Z23 Encounter for immunization: Secondary | ICD-10-CM | POA: Diagnosis not present

## 2021-02-27 DIAGNOSIS — E119 Type 2 diabetes mellitus without complications: Secondary | ICD-10-CM | POA: Diagnosis not present

## 2021-02-27 DIAGNOSIS — R600 Localized edema: Secondary | ICD-10-CM | POA: Diagnosis not present

## 2021-03-19 DIAGNOSIS — I890 Lymphedema, not elsewhere classified: Secondary | ICD-10-CM | POA: Diagnosis not present

## 2021-03-25 DIAGNOSIS — Z6841 Body Mass Index (BMI) 40.0 and over, adult: Secondary | ICD-10-CM | POA: Diagnosis not present

## 2021-03-25 DIAGNOSIS — E669 Obesity, unspecified: Secondary | ICD-10-CM | POA: Diagnosis not present

## 2021-03-25 DIAGNOSIS — E119 Type 2 diabetes mellitus without complications: Secondary | ICD-10-CM | POA: Diagnosis not present

## 2021-03-25 DIAGNOSIS — L03012 Cellulitis of left finger: Secondary | ICD-10-CM | POA: Diagnosis not present

## 2021-03-31 DIAGNOSIS — L03012 Cellulitis of left finger: Secondary | ICD-10-CM | POA: Diagnosis not present

## 2021-03-31 DIAGNOSIS — Z6841 Body Mass Index (BMI) 40.0 and over, adult: Secondary | ICD-10-CM | POA: Diagnosis not present

## 2021-03-31 DIAGNOSIS — E782 Mixed hyperlipidemia: Secondary | ICD-10-CM | POA: Diagnosis not present

## 2021-03-31 DIAGNOSIS — E669 Obesity, unspecified: Secondary | ICD-10-CM | POA: Diagnosis not present

## 2021-03-31 DIAGNOSIS — E119 Type 2 diabetes mellitus without complications: Secondary | ICD-10-CM | POA: Diagnosis not present

## 2021-04-02 DIAGNOSIS — R112 Nausea with vomiting, unspecified: Secondary | ICD-10-CM | POA: Diagnosis not present

## 2021-04-02 DIAGNOSIS — I8390 Asymptomatic varicose veins of unspecified lower extremity: Secondary | ICD-10-CM | POA: Diagnosis not present

## 2021-04-02 DIAGNOSIS — K760 Fatty (change of) liver, not elsewhere classified: Secondary | ICD-10-CM | POA: Diagnosis not present

## 2021-04-02 DIAGNOSIS — R1032 Left lower quadrant pain: Secondary | ICD-10-CM | POA: Diagnosis not present

## 2021-04-02 DIAGNOSIS — K5900 Constipation, unspecified: Secondary | ICD-10-CM | POA: Diagnosis not present

## 2021-04-02 DIAGNOSIS — R1031 Right lower quadrant pain: Secondary | ICD-10-CM | POA: Diagnosis not present

## 2021-04-02 DIAGNOSIS — Z87891 Personal history of nicotine dependence: Secondary | ICD-10-CM | POA: Diagnosis not present

## 2021-04-02 DIAGNOSIS — E1165 Type 2 diabetes mellitus with hyperglycemia: Secondary | ICD-10-CM | POA: Diagnosis not present

## 2021-04-02 DIAGNOSIS — D72829 Elevated white blood cell count, unspecified: Secondary | ICD-10-CM | POA: Diagnosis not present

## 2021-04-02 DIAGNOSIS — Z20822 Contact with and (suspected) exposure to covid-19: Secondary | ICD-10-CM | POA: Diagnosis not present

## 2021-04-02 DIAGNOSIS — K5732 Diverticulitis of large intestine without perforation or abscess without bleeding: Secondary | ICD-10-CM | POA: Diagnosis not present

## 2021-04-16 DIAGNOSIS — I890 Lymphedema, not elsewhere classified: Secondary | ICD-10-CM | POA: Diagnosis not present

## 2021-05-08 DIAGNOSIS — R1032 Left lower quadrant pain: Secondary | ICD-10-CM | POA: Diagnosis not present

## 2021-05-08 DIAGNOSIS — K219 Gastro-esophageal reflux disease without esophagitis: Secondary | ICD-10-CM | POA: Diagnosis not present

## 2021-05-17 DIAGNOSIS — I890 Lymphedema, not elsewhere classified: Secondary | ICD-10-CM | POA: Diagnosis not present

## 2021-05-18 DIAGNOSIS — R1032 Left lower quadrant pain: Secondary | ICD-10-CM | POA: Diagnosis not present

## 2021-05-20 DIAGNOSIS — J309 Allergic rhinitis, unspecified: Secondary | ICD-10-CM | POA: Diagnosis not present

## 2021-05-20 DIAGNOSIS — H6121 Impacted cerumen, right ear: Secondary | ICD-10-CM | POA: Diagnosis not present

## 2021-05-20 DIAGNOSIS — J340 Abscess, furuncle and carbuncle of nose: Secondary | ICD-10-CM | POA: Diagnosis not present

## 2021-06-03 ENCOUNTER — Ambulatory Visit
Admission: EM | Admit: 2021-06-03 | Discharge: 2021-06-03 | Disposition: A | Discharge status: Home / Self Care | Payer: Medicare HMO | Attending: Family | Admitting: Family

## 2021-06-03 DIAGNOSIS — R197 Diarrhea, unspecified: Secondary | ICD-10-CM | POA: Diagnosis not present

## 2021-06-03 DIAGNOSIS — R112 Nausea with vomiting, unspecified: Secondary | ICD-10-CM

## 2021-06-03 DIAGNOSIS — R5383 Other fatigue: Secondary | ICD-10-CM

## 2021-06-03 DIAGNOSIS — E876 Hypokalemia: Secondary | ICD-10-CM

## 2021-06-03 DIAGNOSIS — R1032 Left lower quadrant pain: Secondary | ICD-10-CM | POA: Diagnosis not present

## 2021-06-03 DIAGNOSIS — R7309 Other abnormal glucose: Secondary | ICD-10-CM | POA: Insufficient documentation

## 2021-06-03 DIAGNOSIS — R739 Hyperglycemia, unspecified: Secondary | ICD-10-CM

## 2021-06-03 LAB — CBC WITH DIFFERENTIAL/PLATELET
Abs Immature Granulocytes: 0.01 10*3/uL (ref 0.00–0.07)
Basophils Absolute: 0 10*3/uL (ref 0.0–0.1)
Basophils Relative: 1 %
Eosinophils Absolute: 0.2 10*3/uL (ref 0.0–0.5)
Eosinophils Relative: 3 %
HCT: 43.9 % (ref 36.0–46.0)
Hemoglobin: 15.8 g/dL — ABNORMAL HIGH (ref 12.0–15.0)
Immature Granulocytes: 0 %
Lymphocytes Relative: 25 %
Lymphs Abs: 1.8 10*3/uL (ref 0.7–4.0)
MCH: 32.8 pg (ref 26.0–34.0)
MCHC: 36 g/dL (ref 30.0–36.0)
MCV: 91.3 fL (ref 80.0–100.0)
Monocytes Absolute: 0.7 10*3/uL (ref 0.1–1.0)
Monocytes Relative: 9 %
Neutro Abs: 4.5 10*3/uL (ref 1.7–7.7)
Neutrophils Relative %: 62 %
Platelets: 312 10*3/uL (ref 150–400)
RBC: 4.81 MIL/uL (ref 3.87–5.11)
RDW: 12.9 % (ref 11.5–15.5)
WBC: 7.2 10*3/uL (ref 4.0–10.5)
nRBC: 0 % (ref 0.0–0.2)

## 2021-06-03 LAB — COMPREHENSIVE METABOLIC PANEL
ALT: 51 U/L — ABNORMAL HIGH (ref 0–44)
AST: 51 U/L — ABNORMAL HIGH (ref 15–41)
Albumin: 4 g/dL (ref 3.5–5.0)
Alkaline Phosphatase: 98 U/L (ref 38–126)
Anion gap: 12 (ref 5–15)
BUN: 12 mg/dL (ref 8–23)
CO2: 26 mmol/L (ref 22–32)
Calcium: 9.2 mg/dL (ref 8.9–10.3)
Chloride: 99 mmol/L (ref 98–111)
Creatinine, Ser: 0.9 mg/dL (ref 0.44–1.00)
GFR, Estimated: >60 mL/min (ref >60)
Glucose, Bld: 199 mg/dL — ABNORMAL HIGH (ref 70–99)
Potassium: 3.2 mmol/L — ABNORMAL LOW (ref 3.5–5.1)
Sodium: 137 mmol/L (ref 135–145)
Total Bilirubin: 1.2 mg/dL (ref 0.3–1.2)
Total Protein: 7.9 g/dL (ref 6.5–8.1)

## 2021-06-03 LAB — GLUCOSE, CAPILLARY: Glucose-Capillary: 194 mg/dL — ABNORMAL HIGH (ref 70–99)

## 2021-06-03 MED ORDER — PROMETHAZINE HCL 12.5 MG PO TABS: ORAL_TABLET | ORAL | 0 refills | Status: AC

## 2021-06-03 NOTE — ED Triage Notes (Signed)
Patient is here for "vomiting that started Sunday, vomiting and diarrhea". Pain in lower left abd started Monday night, Diarrhea (loose stools) and vomiting is continuing". Last loose stool/diarrhea "2 am". Last emesis "yesterday around 9 pm". Last void "this morning". No fever.  ?

## 2021-06-03 NOTE — ED Provider Notes (Signed)
?[facility] ? ? ? ?CSN: [id number] ?Arrival date & time: 06/03/21  9563 ? ? ?  ? ?History   ?Chief Complaint ?Chief Complaint  ?Patient presents with  ? Emesis  ? Diarrhea  ? ? ?HPI ?Barbara Wade is a 61 y.o. female.  ? ?61 year old female accompanied by her husband with concern over nausea, vomiting, and diarrhea that started 3 days ago. Also having lower abdominal pain- mainly left lower- for the past 2 days. Continues to be nauseous but last vomited about 12 hours ago. Continues to have loose stools. Denies any blood in her stool or urine. No fever or back pain. Has been trying to drink water and Coke to help calm her stomach with minimal success. Feels fatigued and was uncertain if she had food poisoning. No distinct unusual foods. Has not taken any medication for symptoms. Does have history of IBS and Diverticulosis with recent ER visit at [facility] on 04/02/2021 for Diverticulitis. Was placed on Augmentin and symptoms did improve. Was also given Zofran which made her dizzy. Had a CT scan of her abdomen on 05/18/2021 which showed diverticulosis but no other concerning etiology. Does have a GI specialist at [facility] as well as her PCP with [facility]. Other chronic health issues include type 2 DM - sugars usually around 200, sleep apnea and Meniere's disorder. Currently on Maxzide, Ozempic, Glipizide and Prilosec daily and Albuterol prn.  ? ?The history is provided by the patient.  ? ?Past Medical History:  ?Diagnosis Date  ? Allergy   ? Anaphylaxis 2007  ? occurred during allergy testing to environmental allergen  ? Chronic headaches   ? Colon polyps   ? Depression   ? managed currently with [place]  ? Diabetes mellitus without complication ([place])   ? Diverticulosis   ? Fibromyalgia 2004  ? Gallstones   ? Hepatitis B virus infection   ? History of 2019 novel coronavirus disease (COVID-19) 04/28/2019  ? Irritable bowel syndrome (IBS)   ? Meniere's disease   ? Neuromuscular disorder ([place])   ? Sleep  apnea   ? ? ?Patient Active Problem List  ? Diagnosis Date Noted  ? Nonspecific syndrome suggestive of viral illness 11/04/2020  ? Diarrhea 10/04/2019  ? Diverticulosis of large intestine without diverticulitis 10/04/2019  ? Type 2 diabetes mellitus not at goal [facility]) 10/04/2019  ? Irritable bowel syndrome (IBS) 08/13/2019  ? Hidradenitis suppurativa 10/24/2018  ? CLE (columnar lined esophagus)   ? Gastritis without bleeding   ? Special screening for malignant neoplasms, colon   ? Benign neoplasm of descending colon   ? Essential hypertension 11/22/2017  ? S/P abdominal hysterectomy 07/04/2016  ? Xiphoidalgia syndrome 09/23/2015  ? Esophageal reflux 11/12/2013  ? Sliding hiatal hernia 11/12/2013  ? Trigger finger, acquired 09/19/2013  ? Postprandial epigastric pain 11/01/2012  ? Rosacea 05/02/2012  ? Morbid obesity ([place]) 01/31/2012  ? Vitamin D deficiency 01/05/2012  ? Hand pain, right 12/15/2011  ? Pain of left heel 12/15/2011  ? Lymphedema of leg 07/17/2011  ? Allergic rhinitis 07/17/2011  ? Type 2 diabetes mellitus with complication, without long-term current use of insulin ([place]) 12/02/2010  ? Hyperlipidemia with target LDL less than 70 12/02/2010  ? Hepatitis B virus infection   ? History of colon polyps 10/16/2010  ? Encounter for preventive health examination 10/16/2010  ? History of food anaphylaxis   ? Fibromyalgia   ? Meniere's disease (cochlear hydrops), bilateral 06/26/2010  ? ? ?Past Surgical History:  ?  Procedure Laterality Date  ? ABDOMINAL HYSTERECTOMY  2000  ? for fibroids  ? CHOLECYSTECTOMY    ? elective  ? COLONOSCOPY WITH PROPOFOL N/A 02/20/2018  ? Procedure: COLONOSCOPY WITH PROPOFOL;  Surgeon: Virgel Manifold, MD;  Location: [facility] END[facility]COPY;  Service: Endoscopy;  Laterality: N/A;  ? ESOPHAGOGASTRODUODENOSCOPY (EGD) WITH PROPOFOL N/A 02/20/2018  ? Procedure: ESOPHAGOGASTRODUODENOSCOPY (EGD) WITH PROPOFOL;  Surgeon: Virgel Manifold, MD;  Location: [facility];  Service: Endoscopy;   Laterality: N/A;  ? KNEE ARTHROSCOPY Left 2004  ? PILONIDAL CYST EXCISION  1983  ? TRIGGER FINGER RELEASE Bilateral   ? ? ?OB History   ?No obstetric history on file. ?  ? ? ? ?Home Medications   ? ?Prior to Admission medications   ?Medication Sig Start Date End Date Taking? Authorizing Provider  ?Blood Glucose Monitoring Suppl (FIFTY50 GLUCOSE METER 2.0) w/Device KIT Used to check blood sugars twice per day. 12/01/18  Yes [provider]  ?glipiZIDE (GLUCOTROL) 10 MG tablet TAKE ONE TABLET BY MOUTH TWICE A DAY BEFORE A MEAL 09/06/19  Yes Crecencio Mc, MD  ?glucose blood (PRECISION QID TEST) test strip 1 each (1 strip total) 3 (three) times daily Use as instructed. 12/17/19  Yes [provider]  ?lactulose (CHRONULAC) 10 GM/15ML solution Take by mouth. 06/01/21  Yes [provider]  ?Lancets (ONETOUCH ULTRASOFT) lancets 3 (three) times daily Use as instructed. 12/17/19  Yes [provider]  ?omeprazole (PRILOSEC) 40 MG capsule Take by mouth. 05/08/21 05/08/22 Yes [provider]  ?OZEMPIC, 0.25 OR 0.5 MG/DOSE, 2 MG/3ML SOPN Inject into the skin. 05/25/21  Yes [provider]  ?promethazine (PHENERGAN) 12.5 MG tablet Take 1 to 2 tablets by mouth every 6 hours as needed for nausea/vomiting. 06/03/21  Yes Damyen Knoll, Nicholes Stairs, NP  ?Semaglutide, 1 MG/DOSE, (OZEMPIC, 1 MG/DOSE,) 4 MG/3ML SOPN Inject into the skin. 05/18/21 08/16/21 Yes [provider]  ?triamterene-hydrochlorothiazide (MAXZIDE-25) 37.5-25 MG tablet Take 1 tablet by mouth daily. 11/06/18  Yes Crecencio Mc, MD  ?albuterol (VENTOLIN HFA) 108 (90 Base) MCG/ACT inhaler Inhale 1-2 puffs into the lungs every 6 (six) hours as needed for wheezing or shortness of breath. 05/10/19   Karen Kitchens, NP  ?[employer] D3 1000 units capsule TAKE 2 CAPSULES BY MOUTH DAILY 06/10/15   Crecencio Mc, MD  ?EPINEPHrine (EPIPEN) 0.3 mg/0.3 mL DEVI Inject 0.3 mLs (0.3 mg total) into the muscle once. 05/11/11   Crecencio Mc, MD   ?fexofenadine (ALLEGRA) 180 MG tablet Take 1 tablet (180 mg total) by mouth daily. ?Patient taking differently: Take 180 mg by mouth daily as needed.  04/08/14 04/28/19  Crecencio Mc, MD  ?fluconazole (DIFLUCAN) 200 MG tablet Take 200 mg by mouth daily. 03/25/21   [provider]  ?fluticasone (FLONASE) 50 MCG/ACT nasal spray Place 2 sprays into the nose daily as needed.  07/14/11   Crecencio Mc, MD  ?glucose blood (ONETOUCH VERIO) test strip Used to check blood sugars twice per day. 12/01/18   Jodelle Green, FNP  ?hyoscyamine (LEVSIN SL) 0.125 MG SL tablet DISSOLVE ONE TABLET UNDER THE TONGUE EVERY 4 HOURS AS NEEDED FOR CRAMPING 05/21/19   Crecencio Mc, MD  ?Lactobacillus (PROBIOTIC ACIDOPHILUS PO) Take 1 capsule by mouth 2 (two) times daily.    [provider]  ?meclizine (ANTIVERT) 25 MG tablet Take 1 tablet (25 mg total) by mouth 2 (two) times daily. As needed for vertigo 02/08/18   Crecencio Mc, MD  ?  TRULICITY 1.5 LU/7.2BM SOPN INJECT 1.5 MG SUBQ ONCE A WEEK 05/18/19   Crecencio Mc, MD  ? ? ?Family History ?Family History  ?Problem Relation Age of Onset  ? Heart disease Maternal Aunt   ? Heart disease Maternal Uncle   ? Heart disease Maternal Grandmother   ? Stroke Maternal Grandmother   ? Healthy Mother   ? Diabetes Father   ? Colon polyps Father   ? Colon cancer Paternal Grandmother   ? Colon polyps Paternal Uncle   ? ? ?Social History ?Social History  ? ?Tobacco Use  ? Smoking status: Former  ?  Packs/day: 1.00  ?  Years: 20.00  ?  Pack years: 20.00  ?  Types: Cigarettes  ?  Quit date: 10/16/2002  ?  Years since quitting: 18.6  ? Smokeless tobacco: Never  ?Vaping Use  ? Vaping Use: Never used  ?Substance Use Topics  ? Alcohol use: Not Currently  ?  Alcohol/week: 0.0 - 1.0 standard drinks  ?  Comment: occasional  ? Drug use: No  ? ? ? ?Allergies   ?Hydrocodone, Plum pulp, Prunus persica, and Iodinated contrast media ? ? ?Review of Systems ?Review of Systems  ?Constitutional:  Positive  for activity change, appetite change and fatigue. Negative for chills, diaphoresis and fever.  ?HENT:  Negative for congestion, ear discharge, facial swelling, rhinorrhea, sinus pain, sore throat and trouble swallo

## 2021-06-03 NOTE — Discharge Instructions (Addendum)
Recommend start Phenergan 12.'5mg'$  - take 1 to 2 tablets every 6 hours as needed for nausea/vomiting. Continue to slowly push fluids, especially electrolyte replacement fluids that have potassium in them. Rest. Eat small, frequent bland foods. Call your GI specialist if symptoms persist over the next 48 hours. If abdominal pain gets worse and unable to keep fluids down, go to the ER ASAP. Otherwise, follow-up with your GI Specialist as planned.  ?

## 2021-06-16 DIAGNOSIS — I890 Lymphedema, not elsewhere classified: Secondary | ICD-10-CM | POA: Diagnosis not present

## 2021-06-17 DIAGNOSIS — G4733 Obstructive sleep apnea (adult) (pediatric): Secondary | ICD-10-CM | POA: Diagnosis not present

## 2021-06-18 DIAGNOSIS — L218 Other seborrheic dermatitis: Secondary | ICD-10-CM | POA: Diagnosis not present

## 2021-06-18 DIAGNOSIS — L232 Allergic contact dermatitis due to cosmetics: Secondary | ICD-10-CM | POA: Diagnosis not present

## 2021-07-18 DIAGNOSIS — G4733 Obstructive sleep apnea (adult) (pediatric): Secondary | ICD-10-CM | POA: Diagnosis not present

## 2021-08-04 DIAGNOSIS — Z808 Family history of malignant neoplasm of other organs or systems: Secondary | ICD-10-CM | POA: Diagnosis not present

## 2021-08-04 DIAGNOSIS — L218 Other seborrheic dermatitis: Secondary | ICD-10-CM | POA: Diagnosis not present

## 2021-08-04 DIAGNOSIS — L2389 Allergic contact dermatitis due to other agents: Secondary | ICD-10-CM | POA: Diagnosis not present

## 2021-08-17 DIAGNOSIS — G4733 Obstructive sleep apnea (adult) (pediatric): Secondary | ICD-10-CM | POA: Diagnosis not present

## 2021-09-01 DIAGNOSIS — K5900 Constipation, unspecified: Secondary | ICD-10-CM | POA: Diagnosis not present

## 2021-09-01 DIAGNOSIS — K2289 Other specified disease of esophagus: Secondary | ICD-10-CM | POA: Diagnosis not present

## 2021-09-01 DIAGNOSIS — Z86010 Personal history of colonic polyps: Secondary | ICD-10-CM | POA: Diagnosis not present

## 2021-09-01 DIAGNOSIS — Z1211 Encounter for screening for malignant neoplasm of colon: Secondary | ICD-10-CM | POA: Diagnosis not present

## 2021-09-01 DIAGNOSIS — G4730 Sleep apnea, unspecified: Secondary | ICD-10-CM | POA: Diagnosis not present

## 2021-09-01 DIAGNOSIS — Z1212 Encounter for screening for malignant neoplasm of rectum: Secondary | ICD-10-CM | POA: Diagnosis not present

## 2021-09-01 DIAGNOSIS — R1310 Dysphagia, unspecified: Secondary | ICD-10-CM | POA: Diagnosis not present

## 2021-09-01 DIAGNOSIS — Z87891 Personal history of nicotine dependence: Secondary | ICD-10-CM | POA: Diagnosis not present

## 2021-09-01 DIAGNOSIS — K635 Polyp of colon: Secondary | ICD-10-CM | POA: Diagnosis not present

## 2021-09-01 DIAGNOSIS — Z8719 Personal history of other diseases of the digestive system: Secondary | ICD-10-CM | POA: Diagnosis not present

## 2021-09-01 DIAGNOSIS — K2270 Barrett's esophagus without dysplasia: Secondary | ICD-10-CM | POA: Diagnosis not present

## 2021-09-01 DIAGNOSIS — K648 Other hemorrhoids: Secondary | ICD-10-CM | POA: Diagnosis not present

## 2021-09-01 DIAGNOSIS — R933 Abnormal findings on diagnostic imaging of other parts of digestive tract: Secondary | ICD-10-CM | POA: Diagnosis not present

## 2021-09-01 DIAGNOSIS — R109 Unspecified abdominal pain: Secondary | ICD-10-CM | POA: Diagnosis not present

## 2021-09-01 DIAGNOSIS — R12 Heartburn: Secondary | ICD-10-CM | POA: Diagnosis not present

## 2021-09-01 DIAGNOSIS — D122 Benign neoplasm of ascending colon: Secondary | ICD-10-CM | POA: Diagnosis not present

## 2021-09-01 DIAGNOSIS — R110 Nausea: Secondary | ICD-10-CM | POA: Diagnosis not present

## 2021-09-01 DIAGNOSIS — K3189 Other diseases of stomach and duodenum: Secondary | ICD-10-CM | POA: Diagnosis not present

## 2021-09-01 DIAGNOSIS — Q402 Other specified congenital malformations of stomach: Secondary | ICD-10-CM | POA: Diagnosis not present

## 2021-09-01 DIAGNOSIS — K219 Gastro-esophageal reflux disease without esophagitis: Secondary | ICD-10-CM | POA: Diagnosis not present

## 2021-09-01 DIAGNOSIS — K5730 Diverticulosis of large intestine without perforation or abscess without bleeding: Secondary | ICD-10-CM | POA: Diagnosis not present

## 2021-09-01 DIAGNOSIS — Z9889 Other specified postprocedural states: Secondary | ICD-10-CM | POA: Diagnosis not present

## 2021-09-17 DIAGNOSIS — G4733 Obstructive sleep apnea (adult) (pediatric): Secondary | ICD-10-CM | POA: Diagnosis not present

## 2021-10-18 DIAGNOSIS — G4733 Obstructive sleep apnea (adult) (pediatric): Secondary | ICD-10-CM | POA: Diagnosis not present

## 2021-10-23 DIAGNOSIS — L718 Other rosacea: Secondary | ICD-10-CM | POA: Diagnosis not present

## 2021-10-23 DIAGNOSIS — L218 Other seborrheic dermatitis: Secondary | ICD-10-CM | POA: Diagnosis not present

## 2021-10-23 DIAGNOSIS — L732 Hidradenitis suppurativa: Secondary | ICD-10-CM | POA: Diagnosis not present

## 2021-10-23 DIAGNOSIS — L578 Other skin changes due to chronic exposure to nonionizing radiation: Secondary | ICD-10-CM | POA: Diagnosis not present

## 2021-10-23 DIAGNOSIS — D2261 Melanocytic nevi of right upper limb, including shoulder: Secondary | ICD-10-CM | POA: Diagnosis not present

## 2021-10-23 DIAGNOSIS — L2389 Allergic contact dermatitis due to other agents: Secondary | ICD-10-CM | POA: Diagnosis not present

## 2021-10-23 DIAGNOSIS — Z808 Family history of malignant neoplasm of other organs or systems: Secondary | ICD-10-CM | POA: Diagnosis not present

## 2021-11-17 DIAGNOSIS — G4733 Obstructive sleep apnea (adult) (pediatric): Secondary | ICD-10-CM | POA: Diagnosis not present

## 2021-12-18 DIAGNOSIS — G4733 Obstructive sleep apnea (adult) (pediatric): Secondary | ICD-10-CM | POA: Diagnosis not present

## 2022-01-07 DIAGNOSIS — Z6838 Body mass index (BMI) 38.0-38.9, adult: Secondary | ICD-10-CM | POA: Diagnosis not present

## 2022-01-07 DIAGNOSIS — Z23 Encounter for immunization: Secondary | ICD-10-CM | POA: Diagnosis not present

## 2022-01-07 DIAGNOSIS — Z79899 Other long term (current) drug therapy: Secondary | ICD-10-CM | POA: Diagnosis not present

## 2022-01-07 DIAGNOSIS — Z0000 Encounter for general adult medical examination without abnormal findings: Secondary | ICD-10-CM | POA: Diagnosis not present

## 2022-01-07 DIAGNOSIS — Z7984 Long term (current) use of oral hypoglycemic drugs: Secondary | ICD-10-CM | POA: Diagnosis not present

## 2022-01-07 DIAGNOSIS — Z794 Long term (current) use of insulin: Secondary | ICD-10-CM | POA: Diagnosis not present

## 2022-01-07 DIAGNOSIS — Z1231 Encounter for screening mammogram for malignant neoplasm of breast: Secondary | ICD-10-CM | POA: Diagnosis not present

## 2022-01-07 DIAGNOSIS — Z1330 Encounter for screening examination for mental health and behavioral disorders, unspecified: Secondary | ICD-10-CM | POA: Diagnosis not present

## 2022-01-07 DIAGNOSIS — E1165 Type 2 diabetes mellitus with hyperglycemia: Secondary | ICD-10-CM | POA: Diagnosis not present

## 2022-01-07 DIAGNOSIS — E669 Obesity, unspecified: Secondary | ICD-10-CM | POA: Diagnosis not present

## 2022-01-07 DIAGNOSIS — H9193 Unspecified hearing loss, bilateral: Secondary | ICD-10-CM | POA: Diagnosis not present

## 2022-01-07 DIAGNOSIS — E119 Type 2 diabetes mellitus without complications: Secondary | ICD-10-CM | POA: Diagnosis not present

## 2022-01-07 DIAGNOSIS — Z1331 Encounter for screening for depression: Secondary | ICD-10-CM | POA: Diagnosis not present

## 2022-01-17 DIAGNOSIS — G4733 Obstructive sleep apnea (adult) (pediatric): Secondary | ICD-10-CM | POA: Diagnosis not present

## 2022-02-05 DIAGNOSIS — E1165 Type 2 diabetes mellitus with hyperglycemia: Secondary | ICD-10-CM | POA: Diagnosis not present

## 2022-02-05 DIAGNOSIS — R0982 Postnasal drip: Secondary | ICD-10-CM | POA: Diagnosis not present

## 2022-02-05 DIAGNOSIS — Z794 Long term (current) use of insulin: Secondary | ICD-10-CM | POA: Diagnosis not present

## 2022-02-17 DIAGNOSIS — G4733 Obstructive sleep apnea (adult) (pediatric): Secondary | ICD-10-CM | POA: Diagnosis not present

## 2022-03-20 DIAGNOSIS — G4733 Obstructive sleep apnea (adult) (pediatric): Secondary | ICD-10-CM | POA: Diagnosis not present

## 2022-03-30 DIAGNOSIS — E876 Hypokalemia: Secondary | ICD-10-CM | POA: Diagnosis not present

## 2022-03-30 DIAGNOSIS — Z6841 Body Mass Index (BMI) 40.0 and over, adult: Secondary | ICD-10-CM | POA: Diagnosis not present

## 2022-03-30 DIAGNOSIS — E559 Vitamin D deficiency, unspecified: Secondary | ICD-10-CM | POA: Diagnosis not present

## 2022-03-30 DIAGNOSIS — E119 Type 2 diabetes mellitus without complications: Secondary | ICD-10-CM | POA: Diagnosis not present

## 2022-04-06 DIAGNOSIS — E119 Type 2 diabetes mellitus without complications: Secondary | ICD-10-CM | POA: Diagnosis not present

## 2022-04-06 DIAGNOSIS — Z5320 Procedure and treatment not carried out because of patient's decision for unspecified reasons: Secondary | ICD-10-CM | POA: Diagnosis not present

## 2022-04-06 DIAGNOSIS — E782 Mixed hyperlipidemia: Secondary | ICD-10-CM | POA: Diagnosis not present

## 2022-04-06 DIAGNOSIS — Z794 Long term (current) use of insulin: Secondary | ICD-10-CM | POA: Diagnosis not present

## 2022-04-18 DIAGNOSIS — G4733 Obstructive sleep apnea (adult) (pediatric): Secondary | ICD-10-CM | POA: Diagnosis not present

## 2022-05-19 DIAGNOSIS — G4733 Obstructive sleep apnea (adult) (pediatric): Secondary | ICD-10-CM | POA: Diagnosis not present

## 2022-05-20 DIAGNOSIS — H903 Sensorineural hearing loss, bilateral: Secondary | ICD-10-CM | POA: Diagnosis not present

## 2022-05-20 DIAGNOSIS — H6123 Impacted cerumen, bilateral: Secondary | ICD-10-CM | POA: Diagnosis not present

## 2022-06-18 DIAGNOSIS — G4733 Obstructive sleep apnea (adult) (pediatric): Secondary | ICD-10-CM | POA: Diagnosis not present

## 2022-11-08 DIAGNOSIS — Z1231 Encounter for screening mammogram for malignant neoplasm of breast: Secondary | ICD-10-CM | POA: Diagnosis not present

## 2022-12-30 ENCOUNTER — Ambulatory Visit: Payer: Medicare HMO

## 2022-12-30 ENCOUNTER — Ambulatory Visit
Admission: EM | Admit: 2022-12-30 | Discharge: 2022-12-30 | Disposition: A | Discharge status: Home / Self Care | Payer: Medicare HMO

## 2022-12-30 DIAGNOSIS — I771 Stricture of artery: Secondary | ICD-10-CM | POA: Diagnosis not present

## 2022-12-30 DIAGNOSIS — S20212A Contusion of left front wall of thorax, initial encounter: Secondary | ICD-10-CM | POA: Diagnosis not present

## 2022-12-30 DIAGNOSIS — R0781 Pleurodynia: Secondary | ICD-10-CM | POA: Diagnosis not present

## 2022-12-30 DIAGNOSIS — J9811 Atelectasis: Secondary | ICD-10-CM | POA: Diagnosis not present

## 2022-12-30 NOTE — Discharge Instructions (Addendum)
Your x-rays did not demonstrate any evidence of rib fracture.  I do believe that you have bruised your rib however and this is what is causing your pain.  You may apply topical Voltaren gel to the area of pain, 2 g every 6 hours, as needed for pain or inflammation.  You may also use topical lidocaine patches, 1 patch every 8 hours apply directly to the area of pain to help provide comfort.  Ice application or moist heat application, whichever feels better to you, may also be effective in giving you some relief.  I would recommend that if you have to take a deep breath, sneeze, or cough that she braced the area as to prevent further injury and provide greater comfort.  If her symptoms do not improve, or new symptoms develop, either return for reevaluation or see your primary care provider.

## 2022-12-30 NOTE — ED Triage Notes (Addendum)
Patient states that she was hugged by her cousin Monday night and left ribs have hurt since. Hurts worse when she breathes in. 10/10 pain. Worse today.

## 2022-12-30 NOTE — ED Provider Notes (Addendum)
[facility]    CSN: [id number] Arrival date & time: 12/30/22  1343      History   Chief Complaint Chief Complaint  Patient presents with   Rib Injury    HPI Arha Kowalczyk Hassel Connye Burkitt is a 62 y.o. female.   HPI  62 year old female with a past medical history significant for chronic headaches, depression, diabetes, fibromyalgia, and hepatitis B presents for evaluation of pain in her left ribs.  She reports that she was hugged by her cousin 3 nights ago and she thought his glasses might have been on a lanyard and was caught against her chest wall.  She felt pain initially upon the hug and then she has been experiencing pain with some movement and deep respiration ever since.  It lies right underneath her left breast along her bra line.  She has not noticed any bruising.  She denies any cough or shortness of breath.  Past Medical History:  Diagnosis Date   Allergy    Anaphylaxis 2007   occurred during allergy testing to environmental allergen   Chronic headaches    Colon polyps    Depression    managed currently with [facility]   Diabetes mellitus without complication (HCC)    Diverticulosis    Fibromyalgia 2004   Gallstones    Hepatitis B virus infection    History of 2019 novel coronavirus disease (COVID-19) 04/28/2019   Irritable bowel syndrome (IBS)    Meniere's disease    Neuromuscular disorder (HCC)    Sleep apnea     Patient Active Problem List   Diagnosis Date Noted   Nonspecific syndrome suggestive of viral illness 11/04/2020   Diarrhea 10/04/2019   Diverticulosis of large intestine without diverticulitis 10/04/2019   Type 2 diabetes mellitus not at goal [facility]) 10/04/2019   Irritable bowel syndrome (IBS) 08/13/2019   Hidradenitis suppurativa 10/24/2018   CLE (columnar lined esophagus)    Gastritis without bleeding    Special screening for malignant neoplasms, colon    Benign neoplasm of descending colon    Essential hypertension 11/22/2017    S/P abdominal hysterectomy 07/04/2016   Xiphoidalgia syndrome 09/23/2015   Esophageal reflux 11/12/2013   Sliding hiatal hernia 11/12/2013   Trigger finger, acquired 09/19/2013   Postprandial epigastric pain 11/01/2012   Rosacea 05/02/2012   Morbid obesity (HCC) 01/31/2012   Vitamin D deficiency 01/05/2012   Hand pain, right 12/15/2011   Pain of left heel 12/15/2011   Lymphedema of leg 07/17/2011   Allergic rhinitis 07/17/2011   Type 2 diabetes mellitus with complication, without long-term current use of insulin (HCC) 12/02/2010   Hyperlipidemia with target LDL less than 70 12/02/2010   Hepatitis B virus infection    History of colon polyps 10/16/2010   Encounter for preventive health examination 10/16/2010   History of food anaphylaxis    Fibromyalgia    Meniere's disease (cochlear hydrops), bilateral 06/26/2010    Past Surgical History:  Procedure Laterality Date   ABDOMINAL HYSTERECTOMY  2000   for fibroids   CHOLECYSTECTOMY     elective   COLONOSCOPY WITH PROPOFOL N/A 02/20/2018   Procedure: COLONOSCOPY WITH PROPOFOL;  Surgeon: Pasty Spillers, MD;  Location: [facility] ENDOSCOPY;  Service: Endoscopy;  Laterality: N/A;   ESOPHAGOGASTRODUODENOSCOPY (EGD) WITH PROPOFOL N/A 02/20/2018   Procedure: ESOPHAGOGASTRODUODENOSCOPY (EGD) WITH PROPOFOL;  Surgeon: Pasty Spillers, MD;  Location: [facility] ENDOSCOPY;  Service: Endoscopy;  Laterality: N/A;   KNEE ARTHROSCOPY Left 2004   PILONIDAL CYST EXCISION  1983   TRIGGER FINGER RELEASE Bilateral     OB History   No obstetric history on file.      Home Medications    Prior to Admission medications   Medication Sig Start Date End Date Taking? Authorizing Provider  insulin aspart (NOVOLOG) 100 UNIT/ML injection Inject into the skin. 10/07/22 02/17/23 Yes [provider]  Insulin Glargine (BASAGLAR KWIKPEN) 100 UNIT/ML Increase to 18 units thereafter Increase by 2 units every 3 days to achieve a fasting glucose 70-140   up  to a maximum of 24 units at bedtime . 02/09/22  Yes [provider]  metFORMIN (GLUCOPHAGE-XR) 750 MG 24 hr tablet Take by mouth. 12/02/22  Yes [provider]  triamterene-hydrochlorothiazide (MAXZIDE-25) 37.5-25 MG tablet Take 1 tablet by mouth daily. 11/06/18  Yes Sherlene Shams, MD  albuterol (VENTOLIN HFA) 108 (90 Base) MCG/ACT inhaler Inhale 1-2 puffs into the lungs every 6 (six) hours as needed for wheezing or shortness of breath. 05/10/19   Verlee Monte, NP  Blood Glucose Monitoring Suppl (FIFTY50 GLUCOSE METER 2.0) w/Device KIT Used to check blood sugars twice per day. 12/01/18   [provider]  [employer] D3 1000 units capsule TAKE 2 CAPSULES BY MOUTH DAILY 06/10/15   Sherlene Shams, MD  EPINEPHrine (EPIPEN) 0.3 mg/0.3 mL DEVI Inject 0.3 mLs (0.3 mg total) into the muscle once. 05/11/11   Sherlene Shams, MD  fexofenadine (ALLEGRA) 180 MG tablet Take 1 tablet (180 mg total) by mouth daily. Patient taking differently: Take 180 mg by mouth daily as needed.  04/08/14 04/28/19  Sherlene Shams, MD  fluconazole (DIFLUCAN) 200 MG tablet Take 200 mg by mouth daily. 03/25/21   [provider]  fluticasone (FLONASE) 50 MCG/ACT nasal spray Place 2 sprays into the nose daily as needed.  07/14/11   Sherlene Shams, MD  glipiZIDE (GLUCOTROL) 10 MG tablet TAKE ONE TABLET BY MOUTH TWICE A DAY BEFORE A MEAL 09/06/19   Sherlene Shams, MD  glucose blood (ONETOUCH VERIO) test strip Used to check blood sugars twice per day. 12/01/18   Guse, Janna Arch, FNP  glucose blood (PRECISION QID TEST) test strip 1 each (1 strip total) 3 (three) times daily Use as instructed. 12/17/19   [provider]  hyoscyamine (LEVSIN SL) 0.125 MG SL tablet DISSOLVE ONE TABLET UNDER THE TONGUE EVERY 4 HOURS AS NEEDED FOR CRAMPING 05/21/19   Sherlene Shams, MD  Lactobacillus (PROBIOTIC ACIDOPHILUS PO) Take 1 capsule by mouth 2 (two) times daily.    [provider]  lactulose (CHRONULAC) 10 GM/15ML  solution Take by mouth. 06/01/21   [provider]  Lancets (ONETOUCH ULTRASOFT) lancets 3 (three) times daily Use as instructed. 12/17/19   [provider]  meclizine (ANTIVERT) 25 MG tablet Take 1 tablet (25 mg total) by mouth 2 (two) times daily. As needed for vertigo 02/08/18   Sherlene Shams, MD  OZEMPIC, 0.25 OR 0.5 MG/DOSE, 2 MG/3ML SOPN Inject into the skin. 05/25/21   [provider]  promethazine (PHENERGAN) 12.5 MG tablet Take 1 to 2 tablets by mouth every 6 hours as needed for nausea/vomiting. 06/03/21   Amyot, Ali Lowe, NP  TRULICITY 1.5 MG/0.5ML SOPN INJECT 1.5 MG SUBQ ONCE A WEEK 05/18/19   Sherlene Shams, MD    Family History Family History  Problem Relation Age of Onset   Heart disease Maternal Aunt    Heart disease Maternal Uncle    Heart disease Maternal  Grandmother    Stroke Maternal Grandmother    Healthy Mother    Diabetes Father    Colon polyps Father    Colon cancer Paternal Grandmother    Colon polyps Paternal Uncle     Social History Social History   Tobacco Use   Smoking status: Former    Current packs/day: 0.00    Average packs/day: 1 pack/day for 20.0 years (20.0 ttl pk-yrs)    Types: Cigarettes    Start date: 10/16/1982    Quit date: 10/16/2002    Years since quitting: 20.2   Smokeless tobacco: Never  Vaping Use   Vaping status: Never Used  Substance Use Topics   Alcohol use: Not Currently    Alcohol/week: 0.0 - 1.0 standard drinks of alcohol    Comment: occasional   Drug use: No     Allergies   Hydrocodone, Plum pulp, Prunus persica, and Iodinated contrast media   Review of Systems Review of Systems  Respiratory:  Negative for cough and shortness of breath.   Cardiovascular:  Positive for chest pain.       Left anterior chest wall pain under left breast.  Skin:  Negative for color change.     Physical Exam Triage Vital Signs ED Triage Vitals  Encounter Vitals Group     BP 12/30/22 1438 128/84      Systolic BP Percentile --      Diastolic BP Percentile --      Pulse Rate 12/30/22 1438 68     Resp 12/30/22 1438 17     Temp 12/30/22 1438 98.3 F (36.8 C)     Temp Source 12/30/22 1438 Oral     SpO2 12/30/22 1438 96 %     Weight --      Height --      Head Circumference --      Peak Flow --      Pain Score 12/30/22 1436 10     Pain Loc --      Pain Education --      Exclude from Growth Chart --    No data found.  Updated Vital Signs BP 128/84 (BP Location: Right Arm)   Pulse 68   Temp 98.3 F (36.8 C) (Oral)   Resp 17   SpO2 96%   Visual Acuity Right Eye Distance:   Left Eye Distance:   Bilateral Distance:    Right Eye Near:   Left Eye Near:    Bilateral Near:     Physical Exam Vitals and nursing note reviewed.  Constitutional:      Appearance: Normal appearance. She is not ill-appearing.  HENT:     Head: Normocephalic and atraumatic.  Cardiovascular:     Rate and Rhythm: Normal rate and regular rhythm.     Pulses: Normal pulses.     Heart sounds: Normal heart sounds. No murmur heard.    No friction rub. No gallop.  Pulmonary:     Effort: Pulmonary effort is normal.     Breath sounds: Normal breath sounds. No wheezing, rhonchi or rales.  Skin:    General: Skin is warm and dry.     Capillary Refill: Capillary refill takes less than 2 seconds.     Findings: No bruising or erythema.  Neurological:     General: No focal deficit present.     Mental Status: She is alert and oriented to person, place, and time.      UC Treatments / Results  Labs (all labs  ordered are listed, but only abnormal results are displayed) Labs Reviewed - No data to display  EKG   Radiology No results found.  Procedures Procedures (including critical care time)  Medications Ordered in UC Medications - No data to display  Initial Impression / Assessment and Plan / UC Course  I have reviewed the triage vital signs and the nursing notes.  Pertinent labs & imaging  results that were available during my care of the patient were reviewed by me and considered in my medical decision making (see chart for details).   A pleasant, nontoxic-appearing 62 year old female presenting for evaluation of pain in her left lateral ribs underneath her left breast.  This occurred after her cousin gave her a hug 3 nights ago.  No associated shortness of breath or cough.  Visual inspection does not reveal any ecchymosis, edema, or erythema.  She does have some mild tenderness with palpation of the sixth or seventh rib but no appreciable crepitus.  I will obtain left rib series to rule out any rib injury.  Left rib films independently reviewed and evaluated by me.  Impression: No appreciable rib fractures noted.  Radiology overread is pending. Radiology impression states no evidence of left-sided rib fracture, pneumothorax, or focal rib lesion.  I will discharge patient home with a diagnosis of left rib contusion.  She can use over-the-counter lidocaine patches as well as topical Voltaren gel 4 times a day as needed for pain and inflammation.  She may also apply ice or moist heat to her chest wall, whichever feels better.  If her symptoms does not improve, or new symptoms develop, she can return for reevaluation or see her PCP.   Final Clinical Impressions(s) / UC Diagnoses   Final diagnoses:  Rib contusion, left, initial encounter     Discharge Instructions      Your x-rays did not demonstrate any evidence of rib fracture.  I do believe that you have bruised your rib however and this is what is causing your pain.  You may apply topical Voltaren gel to the area of pain, 2 g every 6 hours, as needed for pain or inflammation.  You may also use topical lidocaine patches, 1 patch every 8 hours apply directly to the area of pain to help provide comfort.  Ice application or moist heat application, whichever feels better to you, may also be effective in giving you some  relief.  I would recommend that if you have to take a deep breath, sneeze, or cough that she braced the area as to prevent further injury and provide greater comfort.  If her symptoms do not improve, or new symptoms develop, either return for reevaluation or see your primary care provider.     ED Prescriptions   None    PDMP not reviewed this encounter.   Becky Augusta, NP 12/30/22 1520    Becky Augusta, NP 12/30/22 1659

## 2023-01-06 DIAGNOSIS — Z23 Encounter for immunization: Secondary | ICD-10-CM | POA: Diagnosis not present

## 2023-01-06 DIAGNOSIS — E782 Mixed hyperlipidemia: Secondary | ICD-10-CM | POA: Diagnosis not present

## 2023-01-06 DIAGNOSIS — E119 Type 2 diabetes mellitus without complications: Secondary | ICD-10-CM | POA: Diagnosis not present

## 2023-01-14 ENCOUNTER — Ambulatory Visit
Admission: EM | Admit: 2023-01-14 | Discharge: 2023-01-14 | Disposition: A | Discharge status: Home / Self Care | Payer: Medicare HMO | Attending: Emergency Medicine | Admitting: Emergency Medicine

## 2023-01-14 DIAGNOSIS — J069 Acute upper respiratory infection, unspecified: Secondary | ICD-10-CM | POA: Diagnosis not present

## 2023-01-14 DIAGNOSIS — J029 Acute pharyngitis, unspecified: Secondary | ICD-10-CM | POA: Diagnosis not present

## 2023-01-14 DIAGNOSIS — K146 Glossodynia: Secondary | ICD-10-CM | POA: Diagnosis not present

## 2023-01-14 LAB — GROUP A STREP BY PCR: Group A Strep by PCR: NOT DETECTED

## 2023-01-14 MED ORDER — AMOXICILLIN-POT CLAVULANATE 875-125 MG PO TABS: 1.0000 | ORAL_TABLET | Freq: Two times a day (BID) | ORAL | 0 refills | Status: AC

## 2023-01-14 MED ORDER — FLUTICASONE PROPIONATE 50 MCG/ACT NA SUSP: 2.0000 | Freq: Every day | NASAL | 0 refills | Status: AC

## 2023-01-14 MED ORDER — PROMETHAZINE-DM 6.25-15 MG/5ML PO SYRP: 5.0000 mL | ORAL_SOLUTION | Freq: Four times a day (QID) | ORAL | 0 refills | Status: AC | PRN

## 2023-01-14 NOTE — ED Triage Notes (Signed)
Patient got a flu shot on Thursday of last week. Started feeling bad Friday.   Cough Sore throat Headache Tongue is sore.   Green mucus

## 2023-01-14 NOTE — ED Provider Notes (Signed)
HPI  SUBJECTIVE:  Barbara Wade is a 62 y.o. female who presents with 6 days of right-sided sore throat, cough occasionally productive of green sputum, right lateral tongue soreness.  She reports nasal congestion, green rhinorrhea, sinus pain and pressure, postnasal drip, dyspnea on exertion, occasional right ear pain.  No fevers, facial swelling, upper dental pain, wheezing, shortness of breath, chest pain, change in hearing, drooling, trismus, neck stiffness, voice changes, sensation of throat swelling shut.  No double sickening.  She is unable to sleep at night because of the cough.  No antibiotics in the past month.  She took Alka-Seltzer within the past 6 hours with improvement in her cough.  She has also tried DayQuil and NyQuil.  No aggravating factors.  No GERD symptoms. Patient has a past medical history of hypertension, poorly controlled diabetes, hypercholesterolemia, fibromyalgia, IBS, a 20-year pack history of smoking, quit 2004, GERD.  No history of hypertension, pulmonary disease.  PCP: [facility].  Past Medical History:  Diagnosis Date   Allergy    Anaphylaxis 2007   occurred during allergy testing to environmental allergen   Chronic headaches    Colon polyps    Depression    managed currently with [facility]   Diabetes mellitus without complication (HCC)    Diverticulosis    Fibromyalgia 2004   Gallstones    Hepatitis B virus infection    History of 2019 novel coronavirus disease (COVID-19) 04/28/2019   Irritable bowel syndrome (IBS)    Meniere's disease    Neuromuscular disorder (HCC)    Sleep apnea     Past Surgical History:  Procedure Laterality Date   ABDOMINAL HYSTERECTOMY  2000   for fibroids   CHOLECYSTECTOMY     elective   COLONOSCOPY WITH PROPOFOL N/A 02/20/2018   Procedure: COLONOSCOPY WITH PROPOFOL;  Surgeon: Pasty Spillers, MD;  Location: [facility] ENDOSCOPY;  Service: Endoscopy;  Laterality: N/A;   ESOPHAGOGASTRODUODENOSCOPY (EGD)  WITH PROPOFOL N/A 02/20/2018   Procedure: ESOPHAGOGASTRODUODENOSCOPY (EGD) WITH PROPOFOL;  Surgeon: Pasty Spillers, MD;  Location: [facility] ENDOSCOPY;  Service: Endoscopy;  Laterality: N/A;   KNEE ARTHROSCOPY Left 2004   PILONIDAL CYST EXCISION  1983   TRIGGER FINGER RELEASE Bilateral     Family History  Problem Relation Age of Onset   Heart disease Maternal Aunt    Heart disease Maternal Uncle    Heart disease Maternal Grandmother    Stroke Maternal Grandmother    Healthy Mother    Diabetes Father    Colon polyps Father    Colon cancer Paternal Grandmother    Colon polyps Paternal Uncle     Social History   Tobacco Use   Smoking status: Former    Current packs/day: 0.00    Average packs/day: 1 pack/day for 20.0 years (20.0 ttl pk-yrs)    Types: Cigarettes    Start date: 10/16/1982    Quit date: 10/16/2002    Years since quitting: 20.2   Smokeless tobacco: Never  Vaping Use   Vaping status: Never Used  Substance Use Topics   Alcohol use: Not Currently    Alcohol/week: 0.0 - 1.0 standard drinks of alcohol    Comment: occasional   Drug use: No    No current facility-administered medications for this encounter.  Current Outpatient Medications:    albuterol (VENTOLIN HFA) 108 (90 Base) MCG/ACT inhaler, Inhale 1-2 puffs into the lungs every 6 (six) hours as needed for wheezing or shortness of breath., Disp: 18 g, Rfl:  0   amoxicillin-clavulanate (AUGMENTIN) 875-125 MG tablet, Take 1 tablet by mouth every 12 (twelve) hours., Disp: 14 tablet, Rfl: 0   Blood Glucose Monitoring Suppl (FIFTY50 GLUCOSE METER 2.0) w/Device KIT, Used to check blood sugars twice per day., Disp: , Rfl:    CVS D3 1000 units capsule, TAKE 2 CAPSULES BY MOUTH DAILY, Disp: 120 capsule, Rfl: 1   EPINEPHrine (EPIPEN) 0.3 mg/0.3 mL DEVI, Inject 0.3 mLs (0.3 mg total) into the muscle once., Disp: 1 Device, Rfl: 6   fluconazole (DIFLUCAN) 200 MG tablet, Take 200 mg by mouth daily., Disp: , Rfl:    fluticasone  (FLONASE) 50 MCG/ACT nasal spray, Place 2 sprays into both nostrils daily., Disp: 16 g, Rfl: 0   glipiZIDE (GLUCOTROL) 10 MG tablet, TAKE ONE TABLET BY MOUTH TWICE A DAY BEFORE A MEAL, Disp: 180 tablet, Rfl: 0   glucose blood (ONETOUCH VERIO) test strip, Used to check blood sugars twice per day., Disp: 100 each, Rfl: 1   glucose blood (PRECISION QID TEST) test strip, 1 each (1 strip total) 3 (three) times daily Use as instructed., Disp: , Rfl:    insulin aspart (NOVOLOG) 100 UNIT/ML injection, Inject into the skin., Disp: , Rfl:    Insulin Glargine (BASAGLAR KWIKPEN) 100 UNIT/ML, Increase to 18 units thereafter Increase by 2 units every 3 days to achieve a fasting glucose 70-140   up to a maximum of 24 units at bedtime ., Disp: , Rfl:    Lactobacillus (PROBIOTIC ACIDOPHILUS PO), Take 1 capsule by mouth 2 (two) times daily., Disp: , Rfl:    lactulose (CHRONULAC) 10 GM/15ML solution, Take by mouth., Disp: , Rfl:    Lancets (ONETOUCH ULTRASOFT) lancets, 3 (three) times daily Use as instructed., Disp: , Rfl:    metFORMIN (GLUCOPHAGE-XR) 750 MG 24 hr tablet, Take by mouth., Disp: , Rfl:    promethazine (PHENERGAN) 12.5 MG tablet, Take 1 to 2 tablets by mouth every 6 hours as needed for nausea/vomiting., Disp: 20 tablet, Rfl: 0   promethazine-dextromethorphan (PROMETHAZINE-DM) 6.25-15 MG/5ML syrup, Take 5 mLs by mouth 4 (four) times daily as needed for cough., Disp: 118 mL, Rfl: 0   triamterene-hydrochlorothiazide (MAXZIDE-25) 37.5-25 MG tablet, Take 1 tablet by mouth daily., Disp: 90 tablet, Rfl: 3   TRULICITY 1.5 MG/0.5ML SOPN, INJECT 1.5 MG SUBQ ONCE A WEEK, Disp: 2 mL, Rfl: 2   hyoscyamine (LEVSIN SL) 0.125 MG SL tablet, DISSOLVE ONE TABLET UNDER THE TONGUE EVERY 4 HOURS AS NEEDED FOR CRAMPING, Disp: 60 tablet, Rfl: 0   meclizine (ANTIVERT) 25 MG tablet, Take 1 tablet (25 mg total) by mouth 2 (two) times daily. As needed for vertigo, Disp: 30 tablet, Rfl: 4   OZEMPIC, 0.25 OR 0.5 MG/DOSE, 2 MG/3ML  SOPN, Inject into the skin., Disp: , Rfl:   Allergies  Allergen Reactions   Hydrocodone Hives   Plum Pulp Anaphylaxis   Prunus Persica Anaphylaxis   Iodinated Contrast Media Itching     ROS  As noted in HPI.   Physical Exam  BP (!) 153/89 (BP Location: Right Arm)   Pulse 75   Temp 98.4 F (36.9 C) (Oral)   Resp 19   SpO2 94%   Constitutional: Well developed, well nourished, no acute distress Eyes:  EOMI, conjunctiva normal bilaterally HENT: Normocephalic, atraumatic,mucus membranes moist.  Right ear: External ear, EAC, TM normal.  No pain with traction on pinna, patient or mastoid.  Mild tenderness palpation of tragus.  Mild right TMJ tenderness.  No crepitus.  Positive nasal congestion.  No maxillary, frontal sinus tenderness.  Erythematous oropharynx, tonsils normal size without exudates.  Raw, tender spot on right lateral tongue with swelling. Neck: No appreciable cervical lymphadenopathy Respiratory: Normal inspiratory effort, lungs clear bilaterally.  No anterior, lateral chest wall tenderness Cardiovascular: Normal rate, regular rhythm, no murmurs rubs or gallops GI: nondistended skin: No rash, skin intact Musculoskeletal: no deformities Neurologic: Alert & oriented x 3, no focal neuro deficits Psychiatric: Speech and behavior appropriate   ED Course   Medications - No data to display  Orders Placed This Encounter  Procedures   Group A Strep by PCR    Standing Status:   Standing    Number of Occurrences:   1    Patient immune status:   Normal    Release to patient:   Immediate [1]    Results for orders placed or performed during the hospital encounter of 01/14/23 (from the past 24 hours)  Group A Strep by PCR     Status: None   Collection Time: 01/14/23  3:00 PM   Specimen: Throat; Sterile Swab  Result Value Ref Range   Group A Strep by PCR NOT DETECTED NOT DETECTED   No results found.  ED Clinical Impression  1. Viral URI with cough   2. Sore  throat   3. Soreness of tongue      ED Assessment/Plan     Strep PCR negative.  No evidence of peritonsillar abscess, epiglottitis, RPA.  Doubt pneumonia.  Lungs are clear, vitals are acceptable.  Presentation consistent with a viral sinusitis.  Home with Flonase, saline nasal irrigation, Mucinex D, Promethazine DM, wait-and-see prescription of Augmentin.  Went over indications for starting this.  We can also try Benadryl/Maalox mixture, salt water rinses for this sore throat and the spot on her tongue as the raw spot could be from where she has been holding cough drops for prolonged period of time.  Discussed labs,  MDM, treatment plan, and plan for follow-up with patient. Discussed sn/sx that should prompt return to the ED. patient agrees with plan.   Meds ordered this encounter  Medications   promethazine-dextromethorphan (PROMETHAZINE-DM) 6.25-15 MG/5ML syrup    Sig: Take 5 mLs by mouth 4 (four) times daily as needed for cough.    Dispense:  118 mL    Refill:  0   fluticasone (FLONASE) 50 MCG/ACT nasal spray    Sig: Place 2 sprays into both nostrils daily.    Dispense:  16 g    Refill:  0   amoxicillin-clavulanate (AUGMENTIN) 875-125 MG tablet    Sig: Take 1 tablet by mouth every 12 (twelve) hours.    Dispense:  14 tablet    Refill:  0      *This clinic note was created using Scientist, clinical (histocompatibility and immunogenetics). Therefore, there may be occasional mistakes despite careful proofreading.  ?    Domenick Gong, MD 01/16/23 1712

## 2023-01-14 NOTE — Discharge Instructions (Signed)
Your strep PCR is negative today.  1 gram of Tylenol and 600 mg ibuprofen together 3-4 times a day as needed for pain.  Make sure you drink plenty of extra fluids.  Some people find salt water gargles and  Traditional Medicinal's "Throat Coat" tea helpful. Take 5 mL of liquid Benadryl and 5 mL of Maalox. Mix it together, and then hold it in your mouth for as long as you can and then swallow. You may do this 4 times a day.  Honey and lemon dissolved in hot water can also be soothing.  Start Mucinex-D to keep the mucous thin and to decongest you.     Most sinus infections are viral and do not need antibiotics unless you have a high fever, have had this for 10 days, or you get better and then get sick again.  I would wait to fill the antibiotics.  Use a NeilMed sinus rinse with distilled water as often as you want to to reduce nasal congestion. Follow the directions on the box.   Promethazine DM as needed for cough.   Go to www.goodrx.com  or www.costplusdrugs.com to look up your medications. This will give you a list of where you can find your prescriptions at the most affordable prices. Or ask the pharmacist what the cash price is, or if they have any other discount programs available to help make your medication more affordable. This can be less expensive than what you would pay with insurance.

## 2023-01-24 DIAGNOSIS — E119 Type 2 diabetes mellitus without complications: Secondary | ICD-10-CM | POA: Diagnosis not present

## 2023-01-24 DIAGNOSIS — Z794 Long term (current) use of insulin: Secondary | ICD-10-CM | POA: Diagnosis not present

## 2023-01-24 DIAGNOSIS — E782 Mixed hyperlipidemia: Secondary | ICD-10-CM | POA: Diagnosis not present

## 2023-01-24 DIAGNOSIS — R7989 Other specified abnormal findings of blood chemistry: Secondary | ICD-10-CM | POA: Diagnosis not present

## 2023-08-23 ENCOUNTER — Ambulatory Visit: Admission: EM | Admit: 2023-08-23 | Discharge: 2023-08-23 | Disposition: A | Discharge status: Home / Self Care

## 2023-08-23 ENCOUNTER — Ambulatory Visit (INDEPENDENT_AMBULATORY_CARE_PROVIDER_SITE_OTHER)

## 2023-08-23 DIAGNOSIS — M79672 Pain in left foot: Secondary | ICD-10-CM

## 2023-08-23 DIAGNOSIS — S93602A Unspecified sprain of left foot, initial encounter: Secondary | ICD-10-CM | POA: Diagnosis not present

## 2023-08-23 DIAGNOSIS — S90122A Contusion of left lesser toe(s) without damage to nail, initial encounter: Secondary | ICD-10-CM

## 2023-08-23 NOTE — ED Triage Notes (Signed)
 Patient states that she stubbed her left foot on her ottoman last night. Pain is a 10/10. Pain is in top of foot and under including toes.

## 2023-08-23 NOTE — ED Provider Notes (Signed)
 [facility]    CSN: [id number] Arrival date & time: 08/23/23  1112      History   Chief Complaint Chief Complaint  Patient presents with   Foot Injury    HPI Barbara Wade is a 63 y.o. female presenting for left foot pain since last night when she hit it on a piece of furniture at home.  Patient reports most pain is of the fifth toe and dorsal forefoot.  She has applied ice and take aspirin without relief.  She has never had a foot fracture.  She is reporting the pain as severe at this time and has pain when bearing weight but is able to do so.  HPI  Past Medical History:  Diagnosis Date   Allergy    Anaphylaxis 2007   occurred during allergy testing to environmental allergen   Chronic headaches    Colon polyps    Depression    managed currently with [facility] Wort   Diabetes mellitus without complication (HCC)    Diverticulosis    Fibromyalgia 2004   Gallstones    Hepatitis B virus infection    History of 2019 novel coronavirus disease (COVID-19) 04/28/2019   Irritable bowel syndrome (IBS)    Meniere's disease    Neuromuscular disorder (HCC)    Sleep apnea     Patient Active Problem List   Diagnosis Date Noted   Nonspecific syndrome suggestive of viral illness 11/04/2020   Diarrhea 10/04/2019   Diverticulosis of large intestine without diverticulitis 10/04/2019   Type 2 diabetes mellitus not at goal [facility]) 10/04/2019   Irritable bowel syndrome (IBS) 08/13/2019   Hidradenitis suppurativa 10/24/2018   CLE (columnar lined esophagus)    Gastritis without bleeding    Special screening for malignant neoplasms, colon    Benign neoplasm of descending colon    Essential hypertension 11/22/2017   S/P abdominal hysterectomy 07/04/2016   Xiphoidalgia syndrome 09/23/2015   Esophageal reflux 11/12/2013   Sliding hiatal hernia 11/12/2013   Trigger finger, acquired 09/19/2013   Postprandial epigastric pain 11/01/2012   Rosacea 05/02/2012   Morbid  obesity (HCC) 01/31/2012   Vitamin D  deficiency 01/05/2012   Hand pain, right 12/15/2011   Pain of left heel 12/15/2011   Lymphedema of leg 07/17/2011   Allergic rhinitis 07/17/2011   Type 2 diabetes mellitus with complication, without long-term current use of insulin  (HCC) 12/02/2010   Hyperlipidemia with target LDL less than 70 12/02/2010   Hepatitis B virus infection    History of colon polyps 10/16/2010   Encounter for preventive health examination 10/16/2010   History of food anaphylaxis    Fibromyalgia    Meniere's disease (cochlear hydrops), bilateral 06/26/2010    Past Surgical History:  Procedure Laterality Date   ABDOMINAL HYSTERECTOMY  2000   for fibroids   CHOLECYSTECTOMY     elective   COLONOSCOPY WITH PROPOFOL  N/A 02/20/2018   Procedure: COLONOSCOPY WITH PROPOFOL ;  Surgeon: Janalyn Keene NOVAK, MD;  Location: [facility] ENDOSCOPY;  Service: Endoscopy;  Laterality: N/A;   ESOPHAGOGASTRODUODENOSCOPY (EGD) WITH PROPOFOL  N/A 02/20/2018   Procedure: ESOPHAGOGASTRODUODENOSCOPY (EGD) WITH PROPOFOL ;  Surgeon: Janalyn Keene NOVAK, MD;  Location: [facility] ENDOSCOPY;  Service: Endoscopy;  Laterality: N/A;   KNEE ARTHROSCOPY Left 2004   PILONIDAL CYST EXCISION  1983   TRIGGER FINGER RELEASE Bilateral     OB History   No obstetric history on file.      Home Medications    Prior to Admission medications   Medication  Sig Start Date End Date Taking? Authorizing Provider  metFORMIN  (GLUCOPHAGE -XR) 750 MG 24 hr tablet Take by mouth. 12/02/22  Yes [provider]  MOUNJARO 2.5 MG/0.5ML Pen PLEASE SEE ATTACHED FOR DETAILED DIRECTIONS 05/20/23  Yes [provider]  triamterene -hydrochlorothiazide (MAXZIDE-25) 37.5-25 MG tablet Take 1 tablet by mouth daily. 11/06/18  Yes Marylynn Verneita CROME, MD  albuterol  (VENTOLIN  HFA) 108 (90 Base) MCG/ACT inhaler Inhale 1-2 puffs into the lungs every 6 (six) hours as needed for wheezing or shortness of breath. 05/10/19   Gray, Bryan E, NP   amoxicillin -clavulanate (AUGMENTIN ) 875-125 MG tablet Take 1 tablet by mouth every 12 (twelve) hours. 01/14/23   Van Knee, MD  Blood Glucose Monitoring Suppl (FIFTY50 GLUCOSE METER 2.0) w/Device KIT Used to check blood sugars twice per day. 12/01/18   [provider]  [employer] D3 1000 units capsule TAKE 2 CAPSULES BY MOUTH DAILY 06/10/15   Marylynn Verneita CROME, MD  EPINEPHrine  (EPIPEN ) 0.3 mg/0.3 mL DEVI Inject 0.3 mLs (0.3 mg total) into the muscle once. 05/11/11   Tullo, Teresa L, MD  fluconazole (DIFLUCAN) 200 MG tablet Take 200 mg by mouth daily. 03/25/21   [provider]  fluticasone  (FLONASE ) 50 MCG/ACT nasal spray Place 2 sprays into both nostrils daily. 01/14/23   Van Knee, MD  glipiZIDE  (GLUCOTROL ) 10 MG tablet TAKE ONE TABLET BY MOUTH TWICE A DAY BEFORE A MEAL 09/06/19   Marylynn Verneita CROME, MD  glucose blood (ONETOUCH VERIO) test strip Used to check blood sugars twice per day. 12/01/18   Guse, Tinnie HERO, FNP  glucose blood (PRECISION QID TEST) test strip 1 each (1 strip total) 3 (three) times daily Use as instructed. 12/17/19   [provider]  hyoscyamine  (LEVSIN  SL) 0.125 MG SL tablet DISSOLVE ONE TABLET UNDER THE TONGUE EVERY 4 HOURS AS NEEDED FOR CRAMPING 05/21/19   Marylynn Verneita CROME, MD  Insulin  Glargine (BASAGLAR  KWIKPEN) 100 UNIT/ML Increase to 18 units thereafter Increase by 2 units every 3 days to achieve a fasting glucose 70-140   up to a maximum of 24 units at bedtime . 02/09/22   [provider]  Lactobacillus (PROBIOTIC ACIDOPHILUS PO) Take 1 capsule by mouth 2 (two) times daily.    [provider]  lactulose  (CHRONULAC ) 10 GM/15ML solution Take by mouth. 06/01/21   [provider]  Lancets (ONETOUCH ULTRASOFT) lancets 3 (three) times daily Use as instructed. 12/17/19   [provider]  meclizine  (ANTIVERT ) 25 MG tablet Take 1 tablet (25 mg total) by mouth 2 (two) times daily. As needed for vertigo 02/08/18   Marylynn Verneita CROME, MD  OZEMPIC, 0.25 OR 0.5 MG/DOSE, 2 MG/3ML SOPN Inject into the skin. 05/25/21   [provider]  promethazine  (PHENERGAN ) 12.5 MG tablet Take 1 to 2 tablets by mouth every 6 hours as needed for nausea/vomiting. 06/03/21   Amyot, Jenkins Lesches, NP  promethazine -dextromethorphan (PROMETHAZINE -DM) 6.25-15 MG/5ML syrup Take 5 mLs by mouth 4 (four) times daily as needed for cough. 01/14/23   Van Knee, MD  TRULICITY  1.5 MG/0.5ML SOPN INJECT 1.5 MG SUBQ ONCE A WEEK 05/18/19   Marylynn Verneita CROME, MD    Family History Family History  Problem Relation Age of Onset   Heart disease Maternal Aunt    Heart disease Maternal Uncle    Heart disease Maternal Grandmother    Stroke Maternal Grandmother    Healthy Mother    Diabetes Father    Colon polyps Father    Colon cancer Paternal  Grandmother    Colon polyps Paternal Uncle     Social History Social History   Tobacco Use   Smoking status: Former    Current packs/day: 0.00    Average packs/day: 1 pack/day for 20.0 years (20.0 ttl pk-yrs)    Types: Cigarettes    Start date: 10/16/1982    Quit date: 10/16/2002    Years since quitting: 20.8   Smokeless tobacco: Never  Vaping Use   Vaping status: Never Used  Substance Use Topics   Alcohol use: Not Currently    Alcohol/week: 0.0 - 1.0 standard drinks of alcohol    Comment: occasional   Drug use: No     Allergies   Hydrocodone, Plum pulp, Prunus persica, and Iodinated contrast media   Review of Systems Review of Systems  Musculoskeletal:  Positive for arthralgias, gait problem and joint swelling.  Skin:  Positive for color change. Negative for wound.  Neurological:  Negative for weakness and numbness.     Physical Exam Triage Vital Signs ED Triage Vitals  Encounter Vitals Group     BP 08/23/23 1212 127/79     Girls Systolic BP Percentile --      Girls Diastolic BP Percentile --      Boys Systolic BP Percentile --      Boys Diastolic BP Percentile --      Pulse Rate  08/23/23 1212 67     Resp 08/23/23 1212 18     Temp 08/23/23 1212 98.3 F (36.8 C)     Temp Source 08/23/23 1212 Oral     SpO2 08/23/23 1212 96 %     Weight --      Height --      Head Circumference --      Peak Flow --      Pain Score 08/23/23 1211 10     Pain Loc --      Pain Education --      Exclude from Growth Chart --    No data found.  Updated Vital Signs BP 127/79 (BP Location: Left Arm)   Pulse 67   Temp 98.3 F (36.8 C) (Oral)   Resp 18   SpO2 96%     Physical Exam Vitals and nursing note reviewed.  Constitutional:      General: She is not in acute distress.    Appearance: Normal appearance. She is not ill-appearing or toxic-appearing.  HENT:     Head: Normocephalic and atraumatic.  Eyes:     General: No scleral icterus.       Right eye: No discharge.        Left eye: No discharge.     Conjunctiva/sclera: Conjunctivae normal.  Cardiovascular:     Rate and Rhythm: Normal rate.  Pulmonary:     Effort: Pulmonary effort is normal. No respiratory distress.  Musculoskeletal:     Cervical back: Neck supple.     Comments: LEFT FOOT: There is mild swelling and contusion of the fifth digit. TTP third, fourth and fifth toes. TTP proximal fifth metatarsal. Good pulses. No wounds.  Skin:    General: Skin is dry.  Neurological:     General: No focal deficit present.     Mental Status: She is alert. Mental status is at baseline.     Motor: No weakness.     Gait: Gait abnormal.  Psychiatric:        Mood and Affect: Mood normal.        Behavior: Behavior normal.  UC Treatments / Results  Labs (all labs ordered are listed, but only abnormal results are displayed) Labs Reviewed - No data to display  EKG   Radiology DG Foot Complete Left Result Date: 08/23/2023 CLINICAL DATA:  Pain in foot after hitting it on fracture. EXAM: LEFT FOOT - COMPLETE 4 VIEW COMPARISON:  None Available. FINDINGS: No fracture or dislocation. Preserved joint spaces. Small well  corticated plantar and Achilles calcaneal spurs. There is sclerosis involving the distal phalanx of the fourth digit, possible enchondroma or bone island. IMPRESSION: No acute osseous abnormality. Electronically Signed   By: Ranell Bring M.D.   On: 08/23/2023 13:32    Procedures Procedures (including critical care time)  Medications Ordered in UC Medications - No data to display  Initial Impression / Assessment and Plan / UC Course  I have reviewed the triage vital signs and the nursing notes.  Pertinent labs & imaging results that were available during my care of the patient were reviewed by me and considered in my medical decision making (see chart for details).   63 year old female presents for left foot pain after injuring it on a piece of furniture yesterday.  X-ray of foot obtained shows no acute fractures.  Patient reported difficulty bearing weight due to pain.  Offered crutches and/or CAM boot.  Patient opts for CAM boot.  Reviewed supportive care with continuing to follow RICE guidelines, Tylenol and aspirin as needed for pain.  Reviewed return precautions.   Final Clinical Impressions(s) / UC Diagnoses   Final diagnoses:  Left foot pain  Foot sprain, left, initial encounter  Contusion of lesser toe of left foot without damage to nail, initial encounter     Discharge Instructions      -Will call if radiologist sees acute abnormality of x-ray that I did not see  SPRAIN: Stressed avoiding painful activities . Reviewed RICE guidelines. Use medications as directed, including NSAIDs. If no NSAIDs have been prescribed for you today, you may take Aleve or Motrin  over the counter. May use Tylenol in between doses of NSAIDs.  If no improvement in the next 1-2 weeks, f/u with PCP or return to our office for reexamination, and please feel free to call or return at any time for any questions or concerns you may have and we will be happy to help you!      You have a condition  requiring you to follow up with Orthopedics so please call one of the following office for appointment:   [facility] Address: [address], [place], [place] [address] Phone: [phone]  [facility] [address] [place], [place] [address] Phone: [phone]  [facility] [address] [place], [place] [address] Phone: [phone]      ED Prescriptions   None    PDMP not reviewed this encounter.   Arvis Jolan NOVAK, PA-C 08/23/23 1344

## 2023-08-23 NOTE — Discharge Instructions (Addendum)
-  Will call if radiologist sees acute abnormality of x-ray that I did not see  SPRAIN: Stressed avoiding painful activities . Reviewed RICE guidelines. Use medications as directed, including NSAIDs. If no NSAIDs have been prescribed for you today, you may take Aleve or Motrin  over the counter. May use Tylenol in between doses of NSAIDs.  If no improvement in the next 1-2 weeks, f/u with PCP or return to our office for reexamination, and please feel free to call or return at any time for any questions or concerns you may have and we will be happy to help you!      You have a condition requiring you to follow up with Orthopedics so please call one of the following office for appointment:   [facility] Address: [address], [place], [place] [address] Phone: [phone]  [facility] [address], [place], [place] [address] Phone: [phone]  [facility] [address] [place], [place] [address] Phone: [phone]

## 2024-02-02 ENCOUNTER — Telehealth: Payer: Self-pay

## 2024-02-02 NOTE — Telephone Encounter (Signed)
 Copied from CRM #[id number]. Topic: Appointments - Scheduling Inquiry for Clinic >> Feb 02, 2024 12:14 PM Alfonso ORN wrote: Reason for CRM: pt called to schedule with pcp Tullo for new patient establish care. She stated that she is a family member of an existing pt and was also previously a patient of tullo. Please contact pt to confirm if pt can be seen [phone]

## 2024-02-03 NOTE — Telephone Encounter (Signed)
Did you accept as a new pt? 

## 2024-02-06 NOTE — Telephone Encounter (Signed)
 Patient return call and has been made aware of previous notation

## 2024-02-06 NOTE — Telephone Encounter (Signed)
 LMTCB. Please let pt know that Dr. Marylynn is unable to accept new patients at this time

## 2024-02-06 NOTE — Telephone Encounter (Signed)
 noted

## 2024-04-02 ENCOUNTER — Ambulatory Visit

## 2024-05-15 ENCOUNTER — Ambulatory Visit
